# Patient Record
Sex: Female | Born: 1960 | Race: White | Hispanic: No | Marital: Married | Smoking: Never smoker
Health system: Southern US, Community
[De-identification: ages and names within clinical notes are randomized; demographics above are authoritative.]

## PROBLEM LIST (undated history)

## (undated) DIAGNOSIS — M329 Systemic lupus erythematosus, unspecified: Secondary | ICD-10-CM

## (undated) DIAGNOSIS — K219 Gastro-esophageal reflux disease without esophagitis: Secondary | ICD-10-CM

## (undated) DIAGNOSIS — I10 Essential (primary) hypertension: Secondary | ICD-10-CM

## (undated) DIAGNOSIS — T8859XA Other complications of anesthesia, initial encounter: Secondary | ICD-10-CM

## (undated) DIAGNOSIS — C439 Malignant melanoma of skin, unspecified: Secondary | ICD-10-CM

## (undated) DIAGNOSIS — F329 Major depressive disorder, single episode, unspecified: Secondary | ICD-10-CM

## (undated) DIAGNOSIS — K5792 Diverticulitis of intestine, part unspecified, without perforation or abscess without bleeding: Secondary | ICD-10-CM

## (undated) DIAGNOSIS — K859 Acute pancreatitis without necrosis or infection, unspecified: Secondary | ICD-10-CM

## (undated) DIAGNOSIS — T4145XA Adverse effect of unspecified anesthetic, initial encounter: Secondary | ICD-10-CM

## (undated) DIAGNOSIS — M3500 Sicca syndrome, unspecified: Secondary | ICD-10-CM

## (undated) DIAGNOSIS — M797 Fibromyalgia: Secondary | ICD-10-CM

## (undated) DIAGNOSIS — G4733 Obstructive sleep apnea (adult) (pediatric): Secondary | ICD-10-CM

## (undated) DIAGNOSIS — K631 Perforation of intestine (nontraumatic): Secondary | ICD-10-CM

## (undated) DIAGNOSIS — F32A Depression, unspecified: Secondary | ICD-10-CM

## (undated) DIAGNOSIS — I776 Arteritis, unspecified: Secondary | ICD-10-CM

## (undated) DIAGNOSIS — IMO0002 Reserved for concepts with insufficient information to code with codable children: Secondary | ICD-10-CM

## (undated) DIAGNOSIS — L509 Urticaria, unspecified: Secondary | ICD-10-CM

## (undated) DIAGNOSIS — K2970 Gastritis, unspecified, without bleeding: Secondary | ICD-10-CM

## (undated) HISTORY — DX: Obstructive sleep apnea (adult) (pediatric): G47.33

## (undated) HISTORY — DX: Major depressive disorder, single episode, unspecified: F32.9

## (undated) HISTORY — PX: ABDOMINOPLASTY: SUR9

## (undated) HISTORY — DX: Acute pancreatitis without necrosis or infection, unspecified: K85.90

## (undated) HISTORY — PX: PANCREAS SURGERY: SHX731

## (undated) HISTORY — PX: LIVER BIOPSY: SHX301

## (undated) HISTORY — PX: APPENDECTOMY: SHX54

## (undated) HISTORY — PX: ABDOMINAL HYSTERECTOMY: SHX81

## (undated) HISTORY — DX: Diverticulitis of intestine, part unspecified, without perforation or abscess without bleeding: K57.92

## (undated) HISTORY — DX: Depression, unspecified: F32.A

## (undated) HISTORY — PX: REDUCTION MAMMAPLASTY: SUR839

## (undated) HISTORY — PX: CHOLECYSTECTOMY: SHX55

## (undated) HISTORY — DX: Urticaria, unspecified: L50.9

## (undated) HISTORY — PX: CERVICAL FUSION: SHX112

## (undated) HISTORY — DX: Perforation of intestine (nontraumatic): K63.1

## (undated) HISTORY — PX: MELANOMA EXCISION: SHX5266

## (undated) HISTORY — DX: Malignant melanoma of skin, unspecified: C43.9

## (undated) HISTORY — PX: BACK SURGERY: SHX140

## (undated) HISTORY — DX: Gastritis, unspecified, without bleeding: K29.70

---
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
## 1992-08-16 ENCOUNTER — Encounter: Payer: Self-pay | Admitting: Internal Medicine

## 1995-03-13 ENCOUNTER — Encounter: Payer: Self-pay | Admitting: Internal Medicine

## 1995-07-10 ENCOUNTER — Encounter: Payer: Self-pay | Admitting: Internal Medicine

## 1997-09-14 ENCOUNTER — Encounter: Payer: Self-pay | Admitting: Internal Medicine

## 1998-10-04 ENCOUNTER — Other Ambulatory Visit: Admission: RE | Admit: 1998-10-04 | Discharge: 1998-10-04 | Payer: Self-pay | Admitting: Gynecology

## 1998-11-07 ENCOUNTER — Encounter: Payer: Self-pay | Admitting: Internal Medicine

## 1998-11-07 ENCOUNTER — Observation Stay (HOSPITAL_COMMUNITY): Admission: AD | Admit: 1998-11-07 | Discharge: 1998-11-08 | Payer: Self-pay | Admitting: Internal Medicine

## 1998-11-23 ENCOUNTER — Other Ambulatory Visit: Admission: RE | Admit: 1998-11-23 | Discharge: 1998-11-23 | Payer: Self-pay | Admitting: Gynecology

## 1999-05-07 ENCOUNTER — Other Ambulatory Visit: Admission: RE | Admit: 1999-05-07 | Discharge: 1999-05-07 | Payer: Self-pay | Admitting: Gynecology

## 1999-07-10 ENCOUNTER — Emergency Department (HOSPITAL_COMMUNITY): Admission: EM | Admit: 1999-07-10 | Discharge: 1999-07-10 | Payer: Self-pay | Admitting: *Deleted

## 1999-07-10 ENCOUNTER — Encounter: Payer: Self-pay | Admitting: Internal Medicine

## 1999-07-10 ENCOUNTER — Encounter: Payer: Self-pay | Admitting: Cardiology

## 1999-09-07 ENCOUNTER — Inpatient Hospital Stay (HOSPITAL_COMMUNITY): Admission: EM | Admit: 1999-09-07 | Discharge: 1999-09-08 | Payer: Self-pay | Admitting: Emergency Medicine

## 1999-09-24 ENCOUNTER — Other Ambulatory Visit: Admission: RE | Admit: 1999-09-24 | Discharge: 1999-09-24 | Payer: Self-pay | Admitting: Gynecology

## 2004-02-20 ENCOUNTER — Encounter: Payer: Self-pay | Admitting: Internal Medicine

## 2004-02-21 ENCOUNTER — Encounter: Payer: Self-pay | Admitting: Internal Medicine

## 2004-07-11 ENCOUNTER — Encounter: Payer: Self-pay | Admitting: Internal Medicine

## 2005-01-24 ENCOUNTER — Encounter: Payer: Self-pay | Admitting: Internal Medicine

## 2006-11-11 DIAGNOSIS — C439 Malignant melanoma of skin, unspecified: Secondary | ICD-10-CM

## 2006-11-11 HISTORY — DX: Malignant melanoma of skin, unspecified: C43.9

## 2007-04-15 ENCOUNTER — Encounter: Payer: Self-pay | Admitting: Internal Medicine

## 2007-05-25 ENCOUNTER — Encounter: Payer: Self-pay | Admitting: Internal Medicine

## 2008-03-07 ENCOUNTER — Encounter: Payer: Self-pay | Admitting: Internal Medicine

## 2008-04-19 ENCOUNTER — Encounter: Payer: Self-pay | Admitting: Internal Medicine

## 2008-11-11 HISTORY — PX: COLONOSCOPY: SHX174

## 2010-01-05 ENCOUNTER — Telehealth: Payer: Self-pay | Admitting: Internal Medicine

## 2010-01-05 ENCOUNTER — Encounter (INDEPENDENT_AMBULATORY_CARE_PROVIDER_SITE_OTHER): Payer: Self-pay | Admitting: *Deleted

## 2010-01-08 ENCOUNTER — Ambulatory Visit: Payer: Self-pay | Admitting: Internal Medicine

## 2010-01-08 DIAGNOSIS — Z9089 Acquired absence of other organs: Secondary | ICD-10-CM | POA: Insufficient documentation

## 2010-01-08 DIAGNOSIS — K219 Gastro-esophageal reflux disease without esophagitis: Secondary | ICD-10-CM | POA: Insufficient documentation

## 2010-01-08 DIAGNOSIS — M3500 Sicca syndrome, unspecified: Secondary | ICD-10-CM | POA: Insufficient documentation

## 2010-01-11 ENCOUNTER — Ambulatory Visit: Payer: Self-pay | Admitting: Internal Medicine

## 2010-01-12 ENCOUNTER — Encounter (INDEPENDENT_AMBULATORY_CARE_PROVIDER_SITE_OTHER): Payer: Self-pay | Admitting: *Deleted

## 2010-01-15 ENCOUNTER — Encounter: Payer: Self-pay | Admitting: Internal Medicine

## 2010-05-31 ENCOUNTER — Emergency Department (HOSPITAL_COMMUNITY): Admission: EM | Admit: 2010-05-31 | Discharge: 2010-05-31 | Payer: Self-pay | Admitting: Emergency Medicine

## 2010-10-03 ENCOUNTER — Ambulatory Visit: Payer: Self-pay | Admitting: Internal Medicine

## 2010-10-03 DIAGNOSIS — K5732 Diverticulitis of large intestine without perforation or abscess without bleeding: Secondary | ICD-10-CM | POA: Insufficient documentation

## 2010-11-08 ENCOUNTER — Telehealth: Payer: Self-pay | Admitting: Internal Medicine

## 2010-11-19 ENCOUNTER — Encounter
Admission: RE | Admit: 2010-11-19 | Discharge: 2010-11-19 | Payer: Self-pay | Source: Home / Self Care | Attending: Family Medicine | Admitting: Family Medicine

## 2010-12-11 NOTE — Miscellaneous (Signed)
Summary: [facility] med  Clinical Lists Changes  Medications: Added new medication of OMEPRAZOLE 40 MG  CPDR (OMEPRAZOLE) 1 each day 30 minutes before meal  twice daily - Signed Rx of OMEPRAZOLE 40 MG  CPDR (OMEPRAZOLE) 1 each day 30 minutes before meal  twice daily;  #60 x 6;  Signed;  Entered by: Clide Cliff RN;  Authorized by: Hart Carwin MD;  Method used: Electronically to [facility]. [phone]*, [address] [place], [place], [place]  [address], Ph: [phone], Fax: [phone]    Prescriptions: OMEPRAZOLE 40 MG  CPDR (OMEPRAZOLE) 1 each day 30 minutes before meal  twice daily  #60 x 6   Entered by:   Clide Cliff RN   Authorized by:   Hart Carwin MD   Signed by:   Clide Cliff RN on 01/11/2010   Method used:   Electronically to        [facility]. [phone]* (retail)       [address]       [place], [place]  [address]       Ph: [phone]       Fax: [phone]   RxID:   [id number]

## 2010-12-11 NOTE — Letter (Signed)
Summary: Appt Reminder 2  [facility]  [address] [place], [place] [address]   Phone: [phone]  Fax: [phone]        January 12, 2010 MRN: [id number]    TYLOR GAMBRILL [address] [place], [place]  [address]    Dear [name],   You have a return appointment with Dr.Dora Juanda Chance on 03-07-10 at 3:30pm. Please remember to bring a complete list of the medicines you are taking, your insurance card and your co-pay.  If you have to cancel or reschedule this appointment, please call before 5:00 pm the evening before to avoid a cancellation fee.  If you have any questions or concerns, please call [phone].    Sincerely,    Laureen Ochs LPN  Appended Document: Appt Reminder 2 Letter mailed to patient.

## 2010-12-11 NOTE — Procedures (Signed)
Summary: Dorie Rank  MD  Dorie Rank  MD   Imported By: Lester [place] 01/18/2010 13:52:06  _____________________________________________________________________  External Attachment:    Type:   Image     Comment:   External Document

## 2010-12-11 NOTE — Miscellaneous (Signed)
Summary: [facility] med  Clinical Lists Changes  Medications: Added new medication of OMEPRAZOLE 40 MG  CPDR (OMEPRAZOLE) 1 each day 30 minutes before meal twice daily Observations: Added new observation of ALLERGY REV: Done (01/11/2010 15:15)

## 2010-12-11 NOTE — Letter (Signed)
Summary: [facility]   Imported By: Lester [place] 01/18/2010 13:43:54  _____________________________________________________________________  External Attachment:    Type:   Image     Comment:   External Document

## 2010-12-11 NOTE — Letter (Signed)
Summary: EGD Instructions  [facility]  [address] [place], [place] [address]   Phone: [phone]  Fax: [phone]       [name]    1961/06/26    MRN: [id number]       Procedure Day /Date: 01/11/10 Thursday     Arrival Time: 1:00 pm     Procedure Time: 2:00 pm     Location of Procedure:                    _ x _ [facility] (4th Floor)   PREPARATION FOR ENDOSCOPY   On 01/11/10 THE DAY OF THE PROCEDURE:  1.   No solid foods, milk or milk products are allowed after midnight the night before your procedure.  2.   Do not drink anything colored red or purple.  Avoid juices with pulp.  No orange juice.  3.  You may drink clear liquids until 12:00 pm, which is 2 hours before your procedure.                                                                                                CLEAR LIQUIDS INCLUDE: Water Jello Ice Popsicles Tea (sugar ok, no milk/cream) Powdered fruit flavored drinks Coffee (sugar ok, no milk/cream) Gatorade Juice: apple, white grape, white cranberry  Lemonade Clear bullion, consomm, broth Carbonated beverages (any kind) Strained chicken noodle soup Hard Candy   MEDICATION INSTRUCTIONS  Unless otherwise instructed, you should take regular prescription medications with a small sip of water as early as possible the morning of your procedure.                   OTHER INSTRUCTIONS  You will need a responsible adult at least 51 years of age to accompany you and drive you home.   This person must remain in the waiting room during your procedure.  Wear loose fitting clothing that is easily removed.  Leave jewelry and other valuables at home.  However, you may wish to bring a book to read or an iPod/MP3 player to listen to music as you wait for your procedure to start.  Remove all body piercing jewelry and leave at home.  Total time from sign-in until discharge is approximately 2-3 hours.  You should  go home directly after your procedure and rest.  You can resume normal activities the day after your procedure.  The day of your procedure you should not:   Drive   Make legal decisions   Operate machinery   Drink alcohol   Return to work  You will receive specific instructions about eating, activities and medications before you leave.    The above instructions have been reviewed and explained to me by   Hortense Ramal CMA Duncan Dull)  January 08, 2010 3:33 PM    I fully understand and can verbalize these instructions _____________________________ Date 01/08/10

## 2010-12-11 NOTE — Progress Notes (Signed)
Summary: Triage  Phone Note Call from Patient Call back at [phone]   Call For: DR Emmanual Gauthreaux Reason for Call: Talk to Nurse Summary of Call: Wants to explain in detail the problem she had last night and she almost had to go to ER. Says she has been a pt of dr Juanda Chance for 20 years but [place]. 6wks ago she moved back. Scheduled rov for next available end of April. Says Dr Juanda Chance knows her very well along with alot of her family members. Initial call taken by: Leanor Kail [facility],  January 05, 2010 12:44 PM  Follow-up for Phone Call        Pt. had an episode of abd.pain, nausea & vomiting  last night, would like to be seen ASAP. Pt. will see Dr.Yeimi Debnam on 01-08-10 at 2:45pm. If symptoms become worse call back immediately or go to ER.  Follow-up by: Laureen Ochs LPN,  January 05, 2010 1:18 PM

## 2010-12-11 NOTE — Procedures (Signed)
Summary: [facility]  Colon/[facility]   Imported By: Lester [place] 01/18/2010 13:42:11  _____________________________________________________________________  External Attachment:    Type:   Image     Comment:   External Document

## 2010-12-11 NOTE — Procedures (Signed)
Summary: Upper Endoscopy  Patient: Abigail [name] Note: All result statuses are Final unless otherwise noted.  Tests: (1) Upper Endoscopy (EGD)   EGD Upper Endoscopy       DONE     [facility]     [address]     [place], [place]  [address]           ENDOSCOPY PROCEDURE REPORT           PATIENT:  Abigail, [name]  MR#:  [id number]     BIRTHDATE:  May 11, 1961, 48 yrs. old  GENDER:  female           ENDOSCOPIST:  Hedwig Morton. Juanda Chance, MD     Referred by:  Robert Bellow, M.D.           PROCEDURE DATE:  01/11/2010     PROCEDURE:  EGD with biopsy     ASA CLASS:  Class I     INDICATIONS:  abdominal pain, dysphagia hx GERD, Sjogren's     syndrome,     post chole, hx pancreatitis           MEDICATIONS:   Versed 8 mg, Fentanyl 50 mcg     TOPICAL ANESTHETIC:  Exactacain Spray           DESCRIPTION OF PROCEDURE:   After the risks benefits and     alternatives of the procedure were thoroughly explained, informed     consent was obtained.  The LB GIF-H180 [id number] endoscope was     introduced through the mouth and advanced to the second portion of     the duodenum, without limitations.  The instrument was slowly     withdrawn as the mucosa was fully examined.     <<PROCEDUREIMAGES>>           Esophagitis was found (see image1, image2, and image8). Grade 2     esophagitis, several >5 mm erosions, no stricture  Mild gastritis     was found (see image7, image5, and image6). coffee ground material     in the stomach  Duodenitis was found. patchy erythema With     standard forceps, a biopsy was obtained and sent to pathology (see     image4 and image3).    Retroflexed views revealed no     abnormalities.    The scope was then withdrawn from the patient     and the procedure completed.           COMPLICATIONS:  None           ENDOSCOPIC IMPRESSION:     1) Esophagitis     2) Mild gastritis     3) Duodenitis     RECOMMENDATIONS:     1) Await biopsy results     2) Anti-reflux regimen to  be follow     increase Aciphex to 20 mg po bid     OV 8 weeks           REPEAT EXAM:  In 0 year(s) for.           ______________________________     Hedwig Morton. Juanda Chance, MD           CC:           n.     eSIGNED:   Hedwig Morton. Alzina Golda at 01/11/2010 02:51 PM           Christena Flake, [phone]  Note: An exclamation mark (!) indicates a result that was  not dispersed into the flowsheet. Document Creation Date: 01/11/2010 2:51 PM _______________________________________________________________________  (1) Order result status: Final Collection or observation date-time: 01/11/2010 14:34 Requested date-time:  Receipt date-time:  Reported date-time:  Referring Physician:   Ordering Physician: Lina Sar [phone]) Specimen Source:  Source: Launa Grill Order Number: [id number] Lab site:   Appended Document: Upper Endoscopy no recall EGD

## 2010-12-11 NOTE — Letter (Signed)
Summary: Patient [facility] Biopsy Results  [facility]  [address] [place], [place] [address]   Phone: [phone]  Fax: [phone]        January 15, 2010 MRN: [id number]    ANAISE STERBENZ [address] [place], [place]  [address]    Dear Ms. Saran,  I am pleased to inform you that the biopsies taken during your recent endoscopic examination did not show any evidence of cancer upon pathologic examination.The biopsies from the duodenum confirm  chronic inflammation.  Additional information/recommendations:  __No further action is needed at this time.  Please follow-up with      your primary care physician for your other healthcare needs.  __ Please call [phone] to schedule a return visit to review      your condition.  _x_ Continue with the treatment plan as outlined on the day of your      exam.  _   Please call us if you are having persistent problems or have questions about your condition that have not been fully answered at this time.  Sincerely,  Hart Carwin MD  This letter has been electronically signed by your physician.  Appended Document: Patient Notice-Endo Biopsy Results Letter mailed 3.8.11

## 2010-12-11 NOTE — Procedures (Signed)
Summary: ENDO/[facility]  END[facility]   Imported By: Lester [place] 01/18/2010 13:55:53  _____________________________________________________________________  External Attachment:    Type:   Image     Comment:   External Document

## 2010-12-11 NOTE — Procedures (Signed)
Summary: [facility]  [facility]   Imported By: Lester [place] 01/18/2010 13:50:59  _____________________________________________________________________  External Attachment:    Type:   Image     Comment:   External Document

## 2010-12-11 NOTE — Assessment & Plan Note (Signed)
Summary: ABD PAIN/YF   History of Present Illness Visit Type: Follow-up Visit Primary GI MD: Lina Sar MD Primary Provider: Nadyne Coombes, MD Requesting Provider: na Chief Complaint: Patient states that she had some diverticulitis about a month ago but she is doing good now. She is just here for follow up. History of Present Illness:   This is a 50 year old white female with  history of Sjgren's syndrome and chronic abdominal pain. I havebeen  seeing her since 39. She moved to [place] for several years  and returned back one year ago. She was followed at [facility] by Dr Alene Mires for keratoconjunctivitis, xerostomia, elevated porphyrins, polyclonal gammopathy. She had a positive rheumatoid factor and positive ANA. 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 She has had periodic colonoscopies and endoscopies in [place]. Her last colonoscopy in 2009 showed diverticulosis. Her last upper endoscopy in March 2011 by me showed esophagitis which responded to AcipHex. Biopsies showed chronic active duodenitis. She has a history of hospitalizations for diverticulitis. Her last attack occurred 3 weeks ago and responded to Cipro and Flagyl.   GI Review of Systems      Denies abdominal pain, acid reflux, belching, bloating, chest pain, dysphagia with liquids, dysphagia with solids, heartburn, loss of appetite, nausea, vomiting, vomiting blood, weight loss, and  weight gain.        Denies anal fissure, black tarry stools, change in bowel habit, constipation, diarrhea, diverticulosis, fecal incontinence, heme positive stool, hemorrhoids, irritable bowel syndrome, jaundice, light color stool, liver problems, rectal bleeding, and  rectal pain.    Current Medications (verified): 1)  Zantac 150 Mg Tabs (Ranitidine Hcl) .... One Tablet By Mouth Once Daily 2)  Ativan 1 Mg Tabs (Lorazepam) .... As Needed 3)  Adderall 5 Mg Tabs (Amphetamine-Dextroamphetamine)  .... Take One By Mouth Once Daily  Allergies (verified): 1)  ! Sulfa 2)  ! Doxycycline 3)  ! Demerol  Past History:  Past Medical History: Asthma Chronic Headaches Diverticulosis Hypertension Obesity Pancreatitis Sjogrens Syndrome  ADD  Past Surgical History: Reviewed history from 01/08/2010 and no changes required. Appendectomy Cholecystectomy Hysterectomy  Family History: Reviewed history from 01/08/2010 and no changes required. Family History of Colon Cancer:Paternal  Uncle  Family History of Colon Polyps:Sister, Brother, and Mother   Social History: Occupation: Part time work Married 2 childern Patient has never smoked.  Alcohol Use - no Daily Caffeine Use: 2 daily  Illicit Drug Use - no  Review of Systems       The patient complains of allergy/sinus and headaches-new.  The patient denies anemia, anxiety-new, arthritis/joint pain, back pain, blood in urine, breast changes/lumps, change in vision, confusion, cough, coughing up blood, depression-new, fainting, fatigue, fever, hearing problems, heart murmur, heart rhythm changes, itching, menstrual pain, muscle pains/cramps, night sweats, nosebleeds, pregnancy symptoms, shortness of breath, skin rash, sleeping problems, sore throat, swelling of feet/legs, swollen lymph glands, thirst - excessive , urination - excessive , urination changes/pain, urine leakage, vision changes, and voice change.         Pertinent positive and negative review of systems were noted in the above HPI. All other ROS was otherwise negative.   Vital Signs:  Patient profile:   50 year old female Height:      65 inches Weight:      207.8 pounds BMI:     34.70 Pulse rate:   84 / minute Pulse rhythm:   regular BP sitting:   110 / 70  (left  arm) Cuff size:   regular  Vitals Entered By: Harlow Mares CMA Duncan Dull) (October 03, 2010 11:07 AM)  Physical Exam  General:  Well developed, well nourished, no acute distress. Eyes:  PERRLA, no  icterus. Mouth:  No deformity or lesions, dentition normal. Neck:  Supple; no masses or thyromegaly. Lungs:  Clear throughout to auscultation. Heart:  Regular rate and rhythm; no murmurs, rubs,  or bruits. Abdomen:  soft abdomen , normoactive bowel sounds. Mild tenderness left lower quadrant. Well-healed surgical scar around the umbilicus. Liver edge at costal margin. Msk:  Symmetrical with no gross deformities. Normal posture. Extremities:  No clubbing, cyanosis, edema or deformities noted. Skin:  Intact without significant lesions or rashes. Psych:  Alert and cooperative. Normal mood and affect.   Impression & Recommendations:  Problem # 1:  DIVERTICULITIS, COLON (ICD-562.11) Patient has symptomatic diverticulosis with episodes of diverticulitis. Her last episode 3 weeks ago responded to antibiotics. It was suggested to her to have a segmental resection of the sigmoid colon like her 2 sisters but she would like to wait. She will slowly increase the fiber in her diet. She cannot take anti-spasmodics because of Sjgren's syndrome causing decreased saliva.. She is up-to-date on her colonoscopy.  Problem # 2:  SJOGREN'S SYNDROME (ICD-710.2) She will try to re-establish with Dr. Alene Mires at [facility].  Problem # 3:  GERD (ICD-530.81) controlled with Prilosec 20 mg daily.  Patient Instructions: 1)  Continue Zantac 150  mg daily. 2)  Continue to increase fiber content in your diet. 3)  Return p.r.n. 4)  Recall colonoscopy in 2014. 5)  Copy sent to : DR K.Richter 6)  The medication list was reviewed and reconciled.  All changed / newly prescribed medications were explained.  A complete medication list was provided to the patient / caregiver.

## 2010-12-11 NOTE — Assessment & Plan Note (Signed)
Summary: ABD PAIN/YF   History of Present Illness Visit Type: consult  Primary GI MD: Lina Sar MD Primary Provider: Robert Bellow, MD  Requesting Provider: Robert Bellow, MD  Chief Complaint: Epigastric pain, nausea, vomiting, dysphagia, and  History of Present Illness:   This is a 50 year old white female former patient of mine who moved away to live in [place] for about 8 years and now has moved back to this area. Her paper records are still in the warehouse and are not available to me today. She comes with episodes of dysphagia,  epigastric and subxiphoid pain which woke her up at 3 in the morning last week. She has known vasculitis and Sjogren's syndrome. She is post cholecystectomy and has a history of low-grade pancreatitis. Her medications include Zantac 75 mg a day and most recently 150 mg a day. The dysphagia occurs with solids as well as with liquids. She had an upper endoscopy in [place] several years ago and was told to have a small hiatal hernia. Patient denies taking any anti-inflammatory agents but was on Prednisone on several occasions last year.   GI Review of Systems    Reports abdominal pain, acid reflux, dysphagia with liquids, dysphagia with solids, heartburn, nausea, and  vomiting.     Location of  Abdominal pain: epigastric area.    Denies belching, bloating, chest pain, loss of appetite, vomiting blood, weight loss, and  weight gain.        Denies anal fissure, black tarry stools, change in bowel habit, constipation, diarrhea, diverticulosis, fecal incontinence, heme positive stool, hemorrhoids, irritable bowel syndrome, jaundice, light color stool, liver problems, rectal bleeding, and  rectal pain.    Current Medications (verified): 1)  Zoloft 50 Mg Tabs (Sertraline Hcl) .... One Tablet By Mouth Once Daily in The Morning 2)  Ritalin 10 Mg Tabs (Methylphenidate Hcl) .... One Tablet By Mouth Once Daily 3)  Lisinopril-Hydrochlorothiazide 20-12.5 Mg Tabs  (Lisinopril-Hydrochlorothiazide) .... One Tablet By Mouth Once Daily 4)  Plaquenil 200 Mg Tabs (Hydroxychloroquine Sulfate) .... One Tablet By Mouth Once Daily 5)  Zantac 150 Mg Tabs (Ranitidine Hcl) .... One Tablet By Mouth Once Daily 6)  Depakote 500 Mg Tbec (Divalproex Sodium) .... One Tablet By Mouth Once Daily 7)  Ativan 1 Mg Tabs (Lorazepam) .... As Needed  Allergies (verified): 1)  ! Sulfa 2)  ! Doxycycline 3)  ! Demerol  Past History:  Past Medical History: Asthma Chronic Headaches Diverticulosis Hypertension Obesity Pancreatitis Sjogrens Syndrome   Past Surgical History: Appendectomy Cholecystectomy Hysterectomy  Family History: Family History of Colon Cancer:Paternal  Uncle  Family History of Colon Polyps:Sister, Brother, and Mother   Social History: Occupation: Unemployed Married 2 childern Patient has never smoked.  Alcohol Use - no Daily Caffeine Use: 2 daily  Illicit Drug Use - no Smoking Status:  never Drug Use:  no  Review of Systems       The patient complains of allergy/sinus and headaches-new.  The patient denies anemia, anxiety-new, arthritis/joint pain, back pain, blood in urine, breast changes/lumps, change in vision, confusion, cough, coughing up blood, depression-new, fainting, fatigue, fever, hearing problems, heart murmur, heart rhythm changes, itching, menstrual pain, muscle pains/cramps, night sweats, nosebleeds, pregnancy symptoms, shortness of breath, skin rash, sleeping problems, sore throat, swelling of feet/legs, swollen lymph glands, thirst - excessive , urination - excessive , urination changes/pain, urine leakage, vision changes, and voice change.         Pertinent positive and negative review of systems were noted in  the above HPI. All other ROS was otherwise negative.   Vital Signs:  Patient profile:   50 year old female Height:      65 inches Weight:      203 pounds BMI:     33.90 BSA:     1.99 Pulse rate:   92 /  minute Pulse rhythm:   regular BP sitting:   100 / 68  (right arm) Cuff size:   regular  Vitals Entered By: Ok Anis CMA (January 08, 2010 2:37 PM)  Physical Exam  General:  Well developed, well nourished, no acute distress. Eyes:  PERRLA, no icterus. Mouth:  No deformity or lesions, dentition normal. Neck:  Supple; no masses or thyromegaly. Lungs:  Clear throughout to auscultation. Heart:  Regular rate and rhythm; no murmurs, rubs,  or bruits. Abdomen:  soft abdomen with normoactive bowel sounds. Post abdominoplasty scars. Tenderness in epigastric and subxiphoid area. No rebound or abnormal pulsations. Liver edge at costal margin. No CVA tenderness. Extremities:  No clubbing, cyanosis, edema or deformities noted. Skin:  Intact without significant lesions or rashes. Psych:  Alert and cooperative. Normal mood and affect.   Impression & Recommendations:  Problem # 1:  GERD (ICD-530.81)  Patient has epigastric pain at night consistent with gastroesophageal reflux. Her dysphagia could be related to esophageal spasm or an esophageal stricture. We will increase her acid suppression by using proton pump inhibitor. AcipHex 20 mg samples have been given for her to take daily. She was advised to follow antireflux measures. We will schedule her for an upper endoscopy and possible dilatation.  Orders: EGD (EGD)  Problem # 2:  CHOLECYSTECTOMY, HX OF (ICD-V45.79) Patient has a history of a remote cholecystectomy and pancreatitis possibly due to vasculitis versus biliary problems. It is not a problem at the present time.  Problem # 3:  SJOGREN'S SYNDROME (ICD-710.2) Patient has vasculitis and Sjogren syndrome of many years duration for which she is followed by a rheumatologist.  Patient Instructions: 1)  AcipHex 20 mg daily. 2)  Antireflux measures. 3)  Upper endoscopy biopsy and possible dilatation., brushing for Candida 4)  Copy sent to : Dr Clarisa Schools 5)  The medication list was  reviewed and reconciled.  All changed / newly prescribed medications were explained.  A complete medication list was provided to the patient / caregiver.

## 2010-12-11 NOTE — Progress Notes (Signed)
Summary[facility]   Imported By: Sherian Rein 01/12/2010 10:14:59  _____________________________________________________________________  External Attachment:    Type:   Image     Comment:   External Document

## 2010-12-11 NOTE — Letter (Signed)
Summary: New Patient letter  [facility]  [address] [place], [place] [address]   Phone: [phone]  Fax: [phone]       01/05/2010 MRN: [id number]  Abigail [name] [address] [place], [place]  [address]  Dear Abigail [name],  Welcome to the Gastroenterology Division at [facility].    You are scheduled to see Dr.  Juanda Chance on 03-05-10 at 2:45pm on the 3rd floor at [facility], [address]  We ask that you try to arrive at our office 15 minutes prior to your appointment time to allow for check-in.  We would like you to complete the enclosed self-administered evaluation form prior to your visit and bring it with you on the day of your appointment.  We will review it with you.  Also, please bring a complete list of all your medications or, if you prefer, bring the medication bottles and we will list them.  Please bring your insurance card so that we may make a copy of it.  If your insurance requires a referral to see a specialist, please bring your referral form from your primary care physician.  Co-payments are due at the time of your visit and may be paid by cash, check or credit card.     Your office visit will consist of a consult with your physician (includes a physical exam), any laboratory testing he/she may order, scheduling of any necessary diagnostic testing (e.g. x-ray, ultrasound, CT-scan), and scheduling of a procedure (e.g. Endoscopy, Colonoscopy) if required.  Please allow enough time on your schedule to allow for any/all of these possibilities.    If you cannot keep your appointment, please call [phone] to cancel or reschedule prior to your appointment date.  This allows [place] the opportunity to schedule an appointment for another patient in need of care.  If you do not cancel or reschedule by 5 p.m. the business day prior to your appointment date, you will be charged a $50.00 late cancellation/no-show fee.    Thank you for choosing  [facility] for your medical needs.  We appreciate the opportunity to care for you.  Please visit [place] at our website  to learn more about our practice.                     Sincerely,                                                             The Gastroenterology Division

## 2010-12-11 NOTE — Procedures (Signed)
Summary: [facility]CP/[facility]  ERCP/[facility]   Imported By: Sherian Rein 01/12/2010 10:19:00  _____________________________________________________________________  External Attachment:    Type:   Image     Comment:   External Document

## 2010-12-13 NOTE — Progress Notes (Signed)
Summary: triage  Phone Note Call from Patient Call back at Home Phone [phone]   Caller: Patient Call For: Dr Juanda Chance Reason for Call: Talk to Nurse Summary of Call: Diverticulities flare up Initial call taken by: Tawni Levy,  November 08, 2010 2:30 PM  Follow-up for Phone Call        Patient calling to report for the last 5 days, she has had left lower abdominal pain that is associated with eating. Pt. states the pain is a dull ache, sometimes crampy. Rating pain at a 4. Patient describes she "feels like the colon is swollen and there is pressure in my rectum."Denies diarrhea. States she is having 4 formed stools/day. States she has nausea. Denies vomiting or fever. Patient states she had diverticulitis back on Nov. and she took Cipro and Flagyl. She did not complete the Flagyl due to a reaction to it.(vomiting). She is allergic to Sulfa, Doxcycline and Demerol.  States "I just feel yucky." She is wondering if she needs a CT scan. Please, advise. Follow-up by: Jesse Fall RN,  November 08, 2010 2:47 PM  Additional Follow-up for Phone Call Additional follow up Details #1::        Rather than doing CT scan ( she had many in the past), I would give her Augmentin 875 mg, #10, 1 by mouth once daily, call us if not better in 7-10 days and we will consider CT scan Additional Follow-up by: Hart Carwin MD,  November 08, 2010 11:07 PM    Additional Follow-up for Phone Call Additional follow up Details #2::    Patient notifed of Dr. Regino Schultze recommendations. Rx sent to patient's pharmacy. Patient will call back in 7-10 days if not better. Patient also requesting an rx for Zofran. States she likes to keep it on hand. She has gotten this from Urgent Care in the past. May she have Zofran rx? Follow-up by: Jesse Fall RN,  November 09, 2010 8:49 AM  Additional Follow-up for Phone Call Additional follow up Details #3:: Details for Additional Follow-up Action Taken: OK to give Zofran 4  mg, # 20, 1 by mouth q 8 hrs as needed nausea Additional Follow-up by: Hart Carwin MD,  November 10, 2010 9:56 PM  New/Updated Medications: AUGMENTIN 875-125 MG TABS (AMOXICILLIN-POT CLAVULANATE) Take one by mouth daily ZOFRAN 4 MG TABS (ONDANSETRON HCL) Take on by mouth every 8 hours as needed nausea Prescriptions: ZOFRAN 4 MG TABS (ONDANSETRON HCL) Take on by mouth every 8 hours as needed nausea  #20 x 0   Entered by:   Jesse Fall RN   Authorized by:   Hart Carwin MD   Signed by:   Jesse Fall RN on 11/13/2010   Method used:   Electronically to        [facility]* (retail)       [address]       [place], [place]  [id number]       Ph: [phone]       Fax: [phone]   RxID:   [id number] AUGMENTIN 875-125 MG TABS (AMOXICILLIN-POT CLAVULANATE) Take one by mouth daily  #10 x 0   Entered by:   Jesse Fall RN   Authorized by:   Hart Carwin MD   Signed by:   Jesse Fall RN on 11/09/2010   Method used:   Electronically to        [facility]* (retail)       [address]  [place], [place]  [id number]       Ph: [phone]       Fax: [phone]   RxID:   [id number]   Appended Document: triage Message left for patient to call back. Jesse Fall, RN 11/13/10 8:28 AM  Appended Document: triage Patient notified that Zofran rx was sent to pharmacy

## 2010-12-14 NOTE — Procedures (Signed)
Summary: Colonoscopy/[facility]  Colonoscopy/[facility]   Imported By: Sherian Rein 01/12/2010 10:17:48  _____________________________________________________________________  External Attachment:    Type:   Image     Comment:   External Document

## 2011-01-26 LAB — CBC
MCH: 29.6 pg (ref 26.0–34.0)
MCHC: 33.7 g/dL (ref 30.0–36.0)
Platelets: 225 10*3/uL (ref 150–400)
RDW: 13.7 % (ref 11.5–15.5)

## 2011-01-26 LAB — BASIC METABOLIC PANEL
CO2: 23 mEq/L (ref 19–32)
Calcium: 8.3 mg/dL — ABNORMAL LOW (ref 8.4–10.5)
Creatinine, Ser: 0.72 mg/dL (ref 0.4–1.2)
Glucose, Bld: 173 mg/dL — ABNORMAL HIGH (ref 70–99)

## 2011-01-26 LAB — D-DIMER, QUANTITATIVE: D-Dimer, Quant: 0.33 ug/mL-FEU (ref 0.00–0.48)

## 2011-01-26 LAB — DIFFERENTIAL
Basophils Absolute: 0 10*3/uL (ref 0.0–0.1)
Basophils Relative: 0 % (ref 0–1)
Eosinophils Absolute: 0 10*3/uL (ref 0.0–0.7)
Neutrophils Relative %: 88 % — ABNORMAL HIGH (ref 43–77)

## 2011-01-26 LAB — POCT CARDIAC MARKERS

## 2011-03-24 ENCOUNTER — Observation Stay (HOSPITAL_COMMUNITY)
Admission: AD | Admit: 2011-03-24 | Discharge: 2011-03-25 | Disposition: A | Discharge status: Home / Self Care | Payer: Medicare Other | Source: Ambulatory Visit | Attending: Family Medicine | Admitting: Family Medicine

## 2011-03-24 ENCOUNTER — Inpatient Hospital Stay (HOSPITAL_COMMUNITY): Payer: Medicare Other

## 2011-03-24 DIAGNOSIS — R319 Hematuria, unspecified: Secondary | ICD-10-CM | POA: Insufficient documentation

## 2011-03-24 DIAGNOSIS — G43909 Migraine, unspecified, not intractable, without status migrainosus: Secondary | ICD-10-CM | POA: Insufficient documentation

## 2011-03-24 DIAGNOSIS — I10 Essential (primary) hypertension: Secondary | ICD-10-CM | POA: Insufficient documentation

## 2011-03-24 DIAGNOSIS — E876 Hypokalemia: Secondary | ICD-10-CM | POA: Insufficient documentation

## 2011-03-24 DIAGNOSIS — R6883 Chills (without fever): Secondary | ICD-10-CM | POA: Insufficient documentation

## 2011-03-24 DIAGNOSIS — IMO0001 Reserved for inherently not codable concepts without codable children: Principal | ICD-10-CM | POA: Insufficient documentation

## 2011-03-24 DIAGNOSIS — M2550 Pain in unspecified joint: Secondary | ICD-10-CM | POA: Insufficient documentation

## 2011-03-24 DIAGNOSIS — K219 Gastro-esophageal reflux disease without esophagitis: Secondary | ICD-10-CM | POA: Insufficient documentation

## 2011-03-24 LAB — COMPREHENSIVE METABOLIC PANEL
Alkaline Phosphatase: 82 U/L (ref 39–117)
BUN: 18 mg/dL (ref 6–23)
Glucose, Bld: 93 mg/dL (ref 70–99)
Potassium: 2.2 mEq/L — CL (ref 3.5–5.1)
Total Bilirubin: 0.5 mg/dL (ref 0.3–1.2)
Total Protein: 7.5 g/dL (ref 6.0–8.3)

## 2011-03-24 LAB — CBC
HCT: 37.4 % (ref 36.0–46.0)
MCHC: 35.3 g/dL (ref 30.0–36.0)
MCV: 80.1 fL (ref 78.0–100.0)
RDW: 12.8 % (ref 11.5–15.5)
WBC: 8.7 10*3/uL (ref 4.0–10.5)

## 2011-03-25 DIAGNOSIS — R509 Fever, unspecified: Secondary | ICD-10-CM

## 2011-03-25 LAB — BASIC METABOLIC PANEL
BUN: 17 mg/dL (ref 6–23)
Calcium: 8.3 mg/dL — ABNORMAL LOW (ref 8.4–10.5)
GFR calc non Af Amer: 54 mL/min — ABNORMAL LOW (ref >60)
Glucose, Bld: 108 mg/dL — ABNORMAL HIGH (ref 70–99)

## 2011-03-25 LAB — URINALYSIS, ROUTINE W REFLEX MICROSCOPIC
Glucose, UA: NEGATIVE mg/dL
Leukocytes, UA: NEGATIVE
Nitrite: NEGATIVE
Protein, ur: NEGATIVE mg/dL
Urobilinogen, UA: 0.2 mg/dL (ref 0.0–1.0)

## 2011-03-25 LAB — URINE MICROSCOPIC-ADD ON

## 2011-03-25 LAB — CBC
HCT: 33.4 % — ABNORMAL LOW (ref 36.0–46.0)
MCH: 28.4 pg (ref 26.0–34.0)
MCHC: 35.3 g/dL (ref 30.0–36.0)
MCV: 80.3 fL (ref 78.0–100.0)
RDW: 12.8 % (ref 11.5–15.5)

## 2011-03-26 LAB — URINE CULTURE
Culture  Setup Time: 201205140426
Culture: NO GROWTH

## 2011-03-28 ENCOUNTER — Other Ambulatory Visit: Payer: Self-pay | Admitting: Emergency Medicine

## 2011-03-29 ENCOUNTER — Inpatient Hospital Stay (HOSPITAL_COMMUNITY)
Admission: AD | Admit: 2011-03-29 | Discharge: 2011-03-31 | DRG: 546 | Disposition: A | Discharge status: Home / Self Care | Payer: Medicare Other | Source: Ambulatory Visit | Attending: Family Medicine | Admitting: Family Medicine

## 2011-03-29 ENCOUNTER — Ambulatory Visit
Admission: RE | Admit: 2011-03-29 | Discharge: 2011-03-29 | Disposition: A | Discharge status: Home / Self Care | Payer: Medicare Other | Source: Ambulatory Visit | Attending: Emergency Medicine | Admitting: Emergency Medicine

## 2011-03-29 DIAGNOSIS — Z881 Allergy status to other antibiotic agents status: Secondary | ICD-10-CM

## 2011-03-29 DIAGNOSIS — K219 Gastro-esophageal reflux disease without esophagitis: Secondary | ICD-10-CM | POA: Diagnosis present

## 2011-03-29 DIAGNOSIS — G43909 Migraine, unspecified, not intractable, without status migrainosus: Secondary | ICD-10-CM | POA: Diagnosis present

## 2011-03-29 DIAGNOSIS — R509 Fever, unspecified: Secondary | ICD-10-CM | POA: Diagnosis present

## 2011-03-29 DIAGNOSIS — Z79899 Other long term (current) drug therapy: Secondary | ICD-10-CM

## 2011-03-29 DIAGNOSIS — F3289 Other specified depressive episodes: Secondary | ICD-10-CM | POA: Diagnosis present

## 2011-03-29 DIAGNOSIS — Z882 Allergy status to sulfonamides status: Secondary | ICD-10-CM

## 2011-03-29 DIAGNOSIS — M3500 Sicca syndrome, unspecified: Principal | ICD-10-CM | POA: Diagnosis present

## 2011-03-29 DIAGNOSIS — Z888 Allergy status to other drugs, medicaments and biological substances status: Secondary | ICD-10-CM

## 2011-03-29 DIAGNOSIS — F329 Major depressive disorder, single episode, unspecified: Secondary | ICD-10-CM | POA: Diagnosis present

## 2011-03-29 DIAGNOSIS — B370 Candidal stomatitis: Secondary | ICD-10-CM | POA: Diagnosis present

## 2011-03-29 LAB — COMPREHENSIVE METABOLIC PANEL
ALT: 37 U/L — ABNORMAL HIGH (ref 0–35)
AST: 25 U/L (ref 0–37)
Albumin: 2.8 g/dL — ABNORMAL LOW (ref 3.5–5.2)
Alkaline Phosphatase: 86 U/L (ref 39–117)
Calcium: 8.4 mg/dL (ref 8.4–10.5)
GFR calc Af Amer: >60 mL/min (ref >60)
Glucose, Bld: 115 mg/dL — ABNORMAL HIGH (ref 70–99)
Potassium: 3.9 mEq/L (ref 3.5–5.1)
Sodium: 133 mEq/L — ABNORMAL LOW (ref 135–145)
Total Protein: 7.3 g/dL (ref 6.0–8.3)

## 2011-03-29 LAB — DIFFERENTIAL
Basophils Absolute: 0 10*3/uL (ref 0.0–0.1)
Eosinophils Relative: 2 % (ref 0–5)
Monocytes Absolute: 0.8 10*3/uL (ref 0.1–1.0)
Monocytes Relative: 10 % (ref 3–12)
Neutrophils Relative %: 71 % (ref 43–77)

## 2011-03-29 LAB — CBC
HCT: 32.4 % — ABNORMAL LOW (ref 36.0–46.0)
MCHC: 34.9 g/dL (ref 30.0–36.0)
RDW: 12.9 % (ref 11.5–15.5)
WBC: 8.1 10*3/uL (ref 4.0–10.5)

## 2011-03-30 DIAGNOSIS — M3500 Sicca syndrome, unspecified: Secondary | ICD-10-CM

## 2011-03-30 DIAGNOSIS — R509 Fever, unspecified: Secondary | ICD-10-CM

## 2011-03-30 LAB — BASIC METABOLIC PANEL
BUN: 20 mg/dL (ref 6–23)
Creatinine, Ser: 1.14 mg/dL (ref 0.4–1.2)
GFR calc non Af Amer: 50 mL/min — ABNORMAL LOW (ref >60)
Glucose, Bld: 188 mg/dL — ABNORMAL HIGH (ref 70–99)

## 2011-03-30 LAB — CBC
HCT: 33.3 % — ABNORMAL LOW (ref 36.0–46.0)
MCH: 27.9 pg (ref 26.0–34.0)
MCHC: 33.9 g/dL (ref 30.0–36.0)
MCV: 82.2 fL (ref 78.0–100.0)
Platelets: 249 10*3/uL (ref 150–400)
RDW: 13 % (ref 11.5–15.5)

## 2011-03-31 LAB — CULTURE, BLOOD (ROUTINE X 2)
Culture  Setup Time: 201205140002
Culture: NO GROWTH
Culture: NO GROWTH

## 2011-04-10 NOTE — Discharge Summary (Signed)
Abigail [name], Abigail [name]               ACCOUNT NO.:  [id number]  MEDICAL RECORD NO.:  [id number]           PATIENT TYPE:  I  LOCATION:  6737                         FACILITY:  [facility]  PHYSICIAN:  Leighton Roach Jai Steil, M.D.DATE OF BIRTH:  [birth year]  DATE OF ADMISSION:  03/29/2011 DATE OF DISCHARGE:  03/31/2011                              DISCHARGE SUMMARY   PRIMARY CARE PROVIDER:  Brett Canales A. [name], MD at [facility].  RHEUMATOLOGIST:  Alben Deeds, MD  DISCHARGE DIAGNOSES: 1. Fever without a source. 2. Sj"gren's disease. 3. Gastroesophageal reflux disease. 4. Depression. 5. Migraines. 6. History of recurrent pancreatitis. 7. History of diverticulitis.  DISCHARGE MEDICATIONS: 1. Zofran 4 mg p.o. q.8 h. p.r.n. 2. Tylenol 650 mg p.o. q.6 h. p.r.n. 3. Depakote 500 mg p.o. daily. 4. Zoloft 50 mg p.o. daily. 5. Ativan 1 mg p.o. at bedtime. 6. Artificial tears b.i.d. 7. Estradiol 1 mg p.o. daily. 8. Zantac 75 mg p.o. daily. 9. Augmentin 875 mg p.o. b.i.d.  CONSULTS:  None.  PROCEDURES:  None.  LABORATORY DATA:  On admission, the patient's CBC showed a white count of 8.1, hemoglobin 11.3, platelet count of 240,000, 71% neutrophils, ANC of 5.7.  Repeat CBC the next day showed WBC 5.7, hemoglobin 11.3, platelets 249,000.  CMET on admission 133/3.9/100/25/19/1.15/115.  T- bili low at 0.2, alk phos 86, AST/ALT 25/37, total protein 7.3, albumin 2.8, calcium 8.4.  Repeat BMET was relatively unchanged.  BRIEF HOSPITAL COURSE:  This is a 50 year old female recently discharged May 14 presenting with continued fevers and rigors. 1. Fever without a source.  The patient has been having fevers for the     past 1-2 weeks.  Recent admission less than 1 week ago showed a     negative infectious workup including negative blood cultures and     urine cultures.  Differential diagnosis on admission included     infectious source versus rheumatologic cause versus other cause.     Given the  recent negative infectious workup, it was felt that the     patient's fevers were more likely due to Sj"gren's disease.  The     patient was directly admitted from her PCPs office for observation     while receiving IV steroids with concern that the patient does have     an infection.  She should be more closely monitored.  In addition,     the patient has a history of steroid psychosis.  While in-house,     the patient did not have any fevers x36 hours.  In addition on the     day of discharge, the patient's rigors and sweats appeared to have     been improved.  The patient was given 3 doses of IV Solu-Medrol 40     mg daily and has close dermatologic followup for the day after     discharge.  No further infectious workup was performed as it was     felt that this is most likely rheumatologic cause. 2. Psych.  The patient with history of depression and steroid     psychosis.  The patient was  continued on her home Zoloft, Depakote     and Ativan and was also given Ativan p.r.n. for the steroid     psychosis. 3. Sinusitis.  The patient with diagnosis of sinusitis from [place].     She was continued on her home Augmentin.  DISCHARGE INSTRUCTIONS:  The patient was instructed to increase activity slowly and as tolerated.  FOLLOWUP:  The patient is to follow up with Dr. Dierdre Forth, outpatient rheumatology tomorrow, May 21.  The patient is to follow up with Dr. [name] in 1-2 weeks, sooner if needed.  DISCHARGE CONDITION:  The patient was discharged home in stable medical condition.    ______________________________ Demetria Pore, MD   ______________________________ Leighton Roach Neal Oshea, M.D.    JM/MEDQ  D:  03/31/2011  T:  03/31/2011  Job:  [id number]  cc:   Brett Canales A. [name], M.D. Alben Deeds, MD  Electronically Signed by Demetria Pore MD on 03/31/2011 05:41:40 PM Electronically Signed by Acquanetta Belling M.D. on 04/10/2011 08:45:31 AM

## 2011-04-10 NOTE — H&P (Signed)
Abigail [name], Abigail [name]               ACCOUNT NO.:  [id number]  MEDICAL RECORD NO.:  [id number]           PATIENT TYPE:  I  LOCATION:  6737                         FACILITY:  [facility]  PHYSICIAN:  Leighton Roach Karman Veney, M.D.DATE OF BIRTH:  05-31-1961  DATE OF ADMISSION:  03/29/2011 DATE OF DISCHARGE:                             HISTORY & PHYSICAL   PRIMARY CARE PROVIDER:  Stan [name]. Abigail Alberts, MD, at [facility].  RHEUMATOLOGISTS:  Dr. Dierdre [name] and Dr. Marliss Coots at [facility].  CHIEF COMPLAINTS:  Fevers, chills, myalgias.  HISTORY OF PRESENT ILLNESS:  This is a 50 year old female with known Sjogren disease recently discharged from our service on Mar 25, 2011, for a fever of unknown origin along with myalgias, arthralgias, and shaking chills.  The patient is now being readmitted for similar symptoms as a direct admit from [facility].  Per the patient, she continues having fever and shaking chills and riders.  The patient was seen last at [facility] after one of these episodes of rigors and was given ceftriaxone x1 per the patient.  This did improve her sleep overnight last night.  The patient over the past 24 hours has began having pain in her left knee and her neck; however, these symptoms appear to be as nonspecific as her other myalgias and arthralgias.  The patient's main complaint, however, are rigors.  The patient does note that she frequently has sweats especially in the middle of the night and these rigors have been always associated with a fever.  She has fever every time she has rigors, but does not have rigors every time she has a fever.  After the patient was at urgent care last night for these rigors, she was told to come back today for follow up, and Dr. Cleta [name] discussed her case with Dr. Dierdre [name] who is her rheumatologist and it was decided that the best next step would be attempting to start steroids; however, at Dr. Dierdre [name] was uncomfortable with this being done as an outpatient  as the patient still could have fever of unknown origin due to an infectious cause.  Therefore, it was suggested that the patient be admitted to the hospital for observation with the start of steroids. The patient states she is still having fevers daily ranging from 99-103 with her most recent being 99 to low 100 throughout the day today.  ALLERGIES: 1. DEMEROL causes tachycardia. 2. LASIX causes shortness of breath. 3. TETRACYCLINES and DOXYCYCLINE causes rash. 4. SULFA causes hives. 5. PHENERGAN causes vomiting.  HOME MEDICATIONS: 1. Zantac 75 mg p.o. daily. 2. Depakote 500 mg p.o. daily. 3. Clonazepam 1 mg p.o. at bedtime. 4. Zoloft 50 mg p.o. daily. 5. Estradiol 10 mg p.o. daily. 6. Augmentin 875 mg p.o. b.i.d.  PAST MEDICAL HISTORY: 1. Migraines. 2. Sjogren, diagnosed in 1990. 3. History of recurrent pancreatitis status post double     sphincterectomy. 4. GERD. 5. History of diverticulitis. 6. Depression.  PAST SURGICAL HISTORY: 1. Hysterectomy, ovaries are still intact. 2. Sphincterectomy x2. 3. Appendectomy. 4. Cholecystectomy. 5. Lower biopsy.  SOCIAL HISTORY:  The patient lives here in [place] with her husband. She  currently works at [employer] in [employer].  She denies tobacco, alcohol, or drug use.  FAMILY HISTORY:  The patient's mother and father are both alive and healthy at ages 73 and 54 respectively.  The patient also has healthy siblings.  Of note, the patient's daughter was diagnosed with neonatal lupus.  REVIEW OF SYSTEMS:  Positive for fevers, chills, sweats, fatigue, sore throat, myalgias, and arthralgias.  Negative for appetite changes, headache, ear pain, chest pain, cough, dyspnea, wheezing, nausea, vomiting, diarrhea, abdominal pain, dysuria, hematuria, nocturia, incontinence, rash, polyuria, polydipsia.  PHYSICAL EXAMINATION:  VITALS:  Temperature 99.6, blood pressure 136/81, pulse 85, respirations 20, pO2 97% on room  air. GENERAL:  No acute distress, lying in bed, texting comfortably. HEENT:  PERRLA.  Extraocular movements intact.  Mucous membranes slightly dry.  No exudate.  Questionable thrush along the patient's sides of tongue.  No lymphadenopathy. CARDIOVASCULAR:  Regular rate and rhythm.  No murmur. LUNGS:  Clear to auscultation bilaterally.  No wheezes or rhonchi. ABDOMEN:  Soft, nontender, nondistended.  Positive bowel sounds. GU:  No CVA tenderness. EXTREMITIES:  Warm and well perfused.  No edema, no rash or bite.  The patient complaining of left knee pain but left knee without erythema or swelling.  No tenderness to palpation.  Right knee also normal.  Right and left knee asymmetric. MUSCULOSKELETAL:  No joint swelling or tenderness.  LABORATORY DATA AND STUDIES:  Labs done at [facility] on 2011-04-09, CBC 6.4/11.1/84.2/184.  BMET 136/3.8/100/22/14/0.96/91.  White blood cell count trend 7.0 on Mar 25, 2011; 6.4 on 09-Apr-2011; 9.5 on Mar 28, 2011.  ESR was 85 on Mar 28, 2011, of note it was 11 on Mar 25, 2011. Urine and blood cultures from Mar 25, 2011, are negative.  Chest x-ray from Mar 24, 2011, negative.  CT abdomen and pelvis from Mar 29, 2011, a.m. was negative for obstructive uropathy or nephrolithiasis with fatty infiltration of liver.  ASSESSMENT AND PLAN:  This is a 50 year old female with Sjogren disease presenting with continued fevers x1 week as well as chills, arthralgias, myalgias, with a recent hospital discharge on Mar 25, 2011. 1. Fevers.  Still of unknown origin but most likely secondary to     rheumatological Sjogren flare.  The patient has had an extensive     infectious disease workup over the past week.  We will hold off on     cultures at this time, but we will obtain blood and urine cultures     if temperature does spike to greater than 101.5.  We will get a     baseline CBC with diff and CMET now and then a repeat CBC and BMET     in the morning with white blood  cell count will likely increase     with steroids.  The only antibiotics which will be continued at     this time is Augmentin which the patient came in for due to a sinus     infection which was diagnosed 2 days ago.  She has been on     Augmentin since Apr 09, 2011.  We will also give Tylenol p.r.n.     fever and discomfort per recommendations of Rheumatology (Dr.     Dierdre [name]).  We will start steroids and monitor.  We will start with     Solu-Medrol 40 mg IV daily.  If the patient has good response,     could potentially increase steroid dosing.  We  will likely be able     to change to p.o. steroids tomorrow.  Dr. Dierdre [name] is concerned with     starting steroids if the patient does have an occult infection that     has not yet been found he was concerned of a rapid infectious     spread.  We will monitor for signs and symptoms of infection.  The     patient will be in a telemetry bed. 2. Depression.  We will continue on Depakote, Zoloft, and clonazepam. 3. Gastroesophageal reflux disease.  We will continue home Protonix. 4. Questionable oral thrush.  Per the patient, she is having pain in     her mouth similar to when she has had thrush in the past.  We will     start nystatin mouthwash swish and spit four times per day.  Per     the patient, she often takes systemic fluconazole; however, at this     time since we are trying to differentiate between a rheumatologic     versus infectious cause of these fevers we will try to stick with     local treatment for this problem. 5. Fluids, electrolytes, nutrition, gastrointestinal.  Put the patient     on a regular diet.  The patient feels the IV fluids help with her     discomfort, so we will run normal saline at 50 mL per hour towards     the next 24 hours. 6. Prophylaxis.  Subcu heparin and Protonix. 7. Disposition.  Pending clinical improvement.  We will admit for     observation while administering first dose of IV steroids.  The      patient already has a followup in place with Dr. Dierdre [name] on Monday     Apr 01, 2011, and at [facility] on Wednesday, Apr 03, 2011.    ______________________________ Demetria Pore, MD   ______________________________ Leighton Roach Queena Monrreal, M.D.    JM/MEDQ  D:  03/29/2011  T:  03/30/2011  Job:  [id number]  Electronically Signed by Demetria Pore MD on 03/31/2011 05:41:25 PM Electronically Signed by Acquanetta Belling M.D. on 04/10/2011 08:45:27 AM

## 2011-04-12 NOTE — H&P (Signed)
NAME:  Abigail [name], Abigail [name] NO.:  [id number]  MEDICAL RECORD NO.:  [id number]           PATIENT TYPE:  I  LOCATION:                                FACILITY:  [facility]  PHYSICIAN:  Pearlean Brownie, M.D.DATE OF BIRTH:  1961/01/29  DATE OF ADMISSION:  03/24/2011 DATE OF DISCHARGE:  03/25/2011                             HISTORY & PHYSICAL   PRIMARY CARE PROVIDER:  [facility].  RHEUMATOLOGIST:  Dr. Gaye Pollack at [facility].  The patient has not seen this doctor for more than 10 years since she moved to [place] and then when she returned to [place] 2 years ago, she was not experiencing any flares.  Her appointment with Dr. Gaye Pollack is in July 2012.  The patient saw Dr. Clarita Crane in [place].  NEUROLOGIST:  Genene Churn. Love, MD  GI:  Hedwig Morton. Juanda Chance, MD  CHIEF COMPLAINT:  Myalgias, chills, fevers.  HISTORY OF PRESENT ILLNESS:  The patient was directly admitted from [facility] and transported via EMS.  For the past 2 weeks, the patient was experiencing worsening myalgias, shaking chills, arthralgias, and bilateral lower back pain.  Also fevers with temperatures 99 to 102 at home for the past 3 days.  Also worsening episodes of "brain phos."  Denies nausea, vomiting, diarrhea, constipation, respiratory symptoms.  Some dysuria with hematuria during this time that had resolved spontaneously.  7 years ago, the patient had been on Remicade for Sjogren's, that since this has been in remission, the patient has not needed medication for several years.  Sjogren's manifests for the patient in multiple ways; dyspnea, elevated LFTs, pancreatitis, petechiae.  Today, the patient is also very thirsty since she had not drank since 1500.  ALLERGIES:  DEMEROL causes tachycardia, LATEX causes shortness breath, TETRACYCLINES and DOXYCYCLINE causes rash, SULFA causes hives, PHENERGAN causes vomiting.  MEDICATIONS: 1. Zantac 75 mg daily 2. Depakote 500 mg daily. 3. Clonazepam 1 mg  at bedtime. 4. Chlorthalidone 25 mg daily. 5. Zoloft 50 mg daily. 6. Estradiol 10 mg daily.  PAST MEDICAL HISTORY: 1. Migraines. 2. Sjogren's diagnosed in 1990. 3. History of recurrent pancreatitis status post double     sphincterectomy at [facility]. 4. GERD. 5. History of diverticulitis. 6. New history of depression.  PAST SURGICAL HISTORY: 1. Hysterectomy with intact ovaries. 2. Sphincterotomy x2. 3. Appendectomy. 4. Cholecystectomy. 5. Liver biopsy.  SOCIAL HISTORY:  The patient lives with her husband in [place].  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  Tobacco none, alcohol none, drugs none.  FAMILY HISTORY:  Mother is healthy, 86 year old.  Father is healthy 53- year-old.  Siblings are healthy.  Daughter diagnosis with neonatal lupus.  REVIEW OF SYSTEMS:  Positive for fever, chills, dysuria, rash, myalgias, and arthralgias.  Negative for changes in appetite, headache, chest pain, cough, dyspnea, nausea, vomiting, diarrhea, abdominal pain, constipation, "the patient had a bowel movement yesterday" hematuria, incontinence, swelling, visual changes, and petechiae.  PHYSICAL EXAMINATION:  VITAL SIGNS:  Temperature 99.2, pulse 109, respiratory rate 17, blood pressure 134/80, pO2 94% on room air. GENERAL:  No apparent distress, lying flat in bed. EYES:  Pupils equal, round, reactive to light and accommodation. Bloodshot watery eyes. MOUTH:  Dry with no oropharyngeal lesions. CARDIOVASCULAR:  Regular rate and rhythm with no murmurs, rubs, or gallops. LUNGS:  No increased work of breathing, good aeration, questionable crackles at bases. ABDOMEN:  Normoactive bowel sounds, soft, nontender, nondistended, no tenderness to palpation with no guarding.  Abdominal surgery scar in lateral and lower transverse well healed. BACK:  Mild noticed to palpation in lumbar paraspinous areas bilaterally with no CVA tenderness to palpation and no midline tenderness  to palpation. EXTREMITIES:  No pretibial or pedal edema. NEURO:  Fully alert and oriented and appropriate to questions with no focal deficits. SKIN:  Blanchable erythematous, maculopapular non-warm rash with few overlying scars non-weeping in the right shin, 3-4 second cap refill.  LABS AND STUDIES:  Following labs were done at [facility]; urinalysis normal.  Urine microscopic 10-12 red blood cells, 0-3 white blood cells, 0-2 epi's, trace bacteria, trace mucus, ESR 54.  White blood count around 10 with 82% neutrophils.  Labs at [facility] on March 08, 2011, showed white blood count 4.3, hemoglobin 12.4, platelets 252.  ASSESSMENT/PLAN:  This is a 50 year old female with past medical history significant for Sjogren syndrome and gastroesophageal reflux disease status post multiple abdominal surgeries presenting with vague symptoms including myalgias, chills, fevers at home, and bilateral lower back pain. 1. Myalgias, chills, fevers at home, lower back pain.  Unclear     etiology, inclination this is attributed to history to Sjogren     especially in light of elevated ESR, multiple vague symptoms, and     no signs of obvious infection.  The patient has been normal;     however, in the higher end of normal white blood count with no     obvious source.  The patient is afebrile here.  We will make sure     that the patient has no infection and check chest x-rays for     pneumonia, repeat urinalysis and urine culture, blood culture.  We     will check CMET and CBC now and we will recheck in the morning with     ESR and CRP.  No signs of UTI based on labs done at [facility] and     diverticular disease, currently is unlikely with absence of     symptoms. 2. History of Sjogren's.  If the study show no signs of infection,     suspected Sjogren's flare, we will start prednisone.  We will also     give the patient Artificial Tears now and rehydrate the patient     with IV fluids.  The patient denies  morning fluid bolus secondary     to being told that she has vasculitis and she had rapid infusions     per previous doctors.  She is taking good p.o., we will rehydrate     with normal saline at 200 mL per hour until the 500 mL bolus is     completed then at 70 mL per hour x24 hours. 3. History of migraines.  Continue home Depakote. 4. History of gastroesophageal reflux disease.  Continue home Zantac. 5. Fluid, electrolytes, and nutrition/gastrointestinal.  See number 2.     Regular diet. 6. History of hypertension.  We will hold diuretics for now since the     patient appears dry.  We will give hydralazine 10 mg IV p.r.n. q.6     h. for blood pressure greater than 160/100. 7. Prophylaxis.  Heparin  subcu 5000 units t.i.d.  Home Zantac for GERD     prophylaxis. 8. Disposition pending clinical improvement.    ______________________________ Priscella Mann, MD   ______________________________ Pearlean Brownie, M.D.    AO/MEDQ  D:  03/24/2011  T:  03/25/2011  Job:  [id number]  Electronically Signed by Priscella Mann MD on 03/29/2011 03:25:39 PM Electronically Signed by Pearlean Brownie M.D. on 04/12/2011 01:37:14 PM

## 2011-04-12 NOTE — Discharge Summary (Signed)
Abigail [name], Abigail [name]               ACCOUNT NO.:  [id number]  MEDICAL RECORD NO.:  [id number]            PATIENT TYPE:  LOCATION:                                 FACILITY:  PHYSICIAN:  Pearlean Brownie, M.D.    DATE OF BIRTH:  DATE OF ADMISSION:  03/24/2011 DATE OF DISCHARGE:  03/25/2011                              DISCHARGE SUMMARY   REASON FOR HOSPITALIZATION:  Fever of unknown origin with associated myalgias, arthralgias, and shaking chills with concern for infection versus Sjogren flare.  DIFFERENTIAL DIAGNOSES: 1. Viral infection versus Sjogren flare. 2. Hypokalemia.  MEDICATIONS:  New Medications: 1. Tylenol 650 mg p.o. q.6 h. p.r.n. 2. Artificial Tears 1 drop in both eyes twice daily as needed for dry     eyes. 3. Zofran 4 mg p.o. q.8 h. p.r.n.  Continue Home Medications: 1. Depakote 500 mg p.o. daily. 2. Estradiol 1 mg p.o. daily. 3. Lorazepam 1 mg p.o. at bedtime. 4. Zantac 75 mg p.o. daily. 5. Zoloft 50 mg p.o. daily.  DISCONTINUED MEDICATIONS:  Chlorthalidone 25 mg p.o. daily.  CONSULTS:  None.  PROCEDURES:  Chest x-ray showed no acute cardiopulmonary abnormality.  PERTINENT LABS:  White blood count on admission 8.7, potassium on admission 2.2, potassium on hospital day 2 of 3.0.  Urinalysis abnormal for large blood.  Urine microscopic abnormal for 11-20 white blood cells and few bacteria with 0-2 white blood cells.  ESR 40.  C-reactive protein 10.8.  Blood cultures negative x2.  Urine culture, no growth.  BRIEF HOSPITAL COURSE:  This is a 50 year old female with a history of Sjogren disease presenting with multiple vague complaints including fevers at home of 99-102 and documented fever at her PCP's office of 101 and myalgias, arthralgias, and shaking chills.  The patient was admitted for observation. 1. Fever of unknown origin with myalgias, arthralgias, and shaking     chills.  Concern for infection due to elevated ESR PCP's office of     54 and  fevers.  However, white blood count here was normal, ESR had     trended down to 40 by the next day, chest x-ray was negative, urine     study showed unlikely UTI especially in light of having no urinary     symptoms.  Urine and blood cultures were negative.  It was     considered that these symptoms may have been attributed to her     Sjogren's, which have been in remission for the past 10 years.     However, besides the conjunctival and oral dryness, which resolved     with fluids, the patient did not have any obvious signs of Sjogren     flare including no joint swelling, erythema, or remarkable     tenderness to palpation, no lymphadenopathy, no respiratory     abnormality.  Steroids were not started during her hospitalization     in the event that the patient may still have a bacterial infection     as blood in urine cultures were still pending by the time of     discharge or viral infection that would  resolve on its own.  The     patient was afebrile on admission and during her hospitalization. 2. Dehydration.  The patient appeared dehydrated with very dry mucous     membranes and dry eyes, although her capillary refill and blood     pressure were good and the patient was not tachycardic.  The     patient was given a fluid bolus and half maintenance IV fluids     overnight and she was unable to take good p.o. 3. Hypokalemia.  Potassium on admission was 2.2.  The patient had no     ECG abnormalities.  Potassium was repleted overnight and improved     to 3.0 by the next morning.  The patient felt significantly better     the following day following fluid and potassium repletion.     Hypokalemia was attributed to the patient's chlorthalidone, which     she had been taking for the past few months for elevated blood     pressures.  This medication was held during her hospitalization and     the patient was asked to stop taking this medication at the time of     discharge. 4. Hematuria.   The patient did not have frank blood in her urine, but     her urine urinalysis and urine microscopic were positive for blood.     The patient no longer has periods at this time.  There was concern     that the blood may have been due to her urine infection; however,     cultures were negative.  The patient was asked to follow up with     her PCP regarding this problem and to get a repeat urine studies     when the patient was well.  DISCHARGE INSTRUCTIONS:  The patient was asked to follow up with her PCP in 1 week.  FOLLOWUP ISSUES: 1. Consider starting the patient on steroids if symptoms not improved     over the next several days for possible Sjogren flare especially in     light of negative cultures. 2. Recommended repeat urinalysis and urine microscopic since the     patient presented with blood in the urine to make sure this has     resolved.  If still has hematuria, consider Urology consult as an     outpatient especially with the patient's history of interstitial     nephritis. 3. PCP may consider changing the patient's blood pressure medication     chlorthalidone to another antihypertensive, especially with the     patient's history of Sjogren, it makes it susceptible to     dehydration and in light of the patient's significant hypokalemia     on this medication.    ______________________________ Priscella Mann, MD   ______________________________ Pearlean Brownie, M.D.    AO/MEDQ  D:  03/28/2011  T:  03/29/2011  Job:  [id number]  cc:   Brett Canales A. [name], M.D.  Electronically Signed by Priscella Mann MD on 04/05/2011 11:34:53 PM Electronically Signed by Pearlean Brownie M.D. on 04/12/2011 01:37:22 PM

## 2011-04-15 ENCOUNTER — Emergency Department (HOSPITAL_COMMUNITY)
Admission: EM | Admit: 2011-04-15 | Discharge: 2011-04-15 | Disposition: A | Discharge status: Home / Self Care | Payer: Medicare Other | Attending: Emergency Medicine | Admitting: Emergency Medicine

## 2011-04-15 DIAGNOSIS — J45909 Unspecified asthma, uncomplicated: Secondary | ICD-10-CM | POA: Insufficient documentation

## 2011-04-15 DIAGNOSIS — Z79899 Other long term (current) drug therapy: Secondary | ICD-10-CM | POA: Insufficient documentation

## 2011-04-15 DIAGNOSIS — R112 Nausea with vomiting, unspecified: Secondary | ICD-10-CM | POA: Insufficient documentation

## 2011-04-15 DIAGNOSIS — R197 Diarrhea, unspecified: Secondary | ICD-10-CM | POA: Insufficient documentation

## 2011-04-15 DIAGNOSIS — I10 Essential (primary) hypertension: Secondary | ICD-10-CM | POA: Insufficient documentation

## 2011-04-15 LAB — DIFFERENTIAL
Eosinophils Relative: 1 % (ref 0–5)
Lymphs Abs: 0.7 10*3/uL (ref 0.7–4.0)
Monocytes Absolute: 0.5 10*3/uL (ref 0.1–1.0)
Monocytes Relative: 4 % (ref 3–12)
Neutro Abs: 11.1 10*3/uL — ABNORMAL HIGH (ref 1.7–7.7)

## 2011-04-15 LAB — URINALYSIS, ROUTINE W REFLEX MICROSCOPIC
Bilirubin Urine: NEGATIVE
Ketones, ur: NEGATIVE mg/dL
Nitrite: NEGATIVE
Protein, ur: NEGATIVE mg/dL
Urobilinogen, UA: 1 mg/dL (ref 0.0–1.0)

## 2011-04-15 LAB — CBC
HCT: 40.1 % (ref 36.0–46.0)
Hemoglobin: 13.5 g/dL (ref 12.0–15.0)
MCH: 28.3 pg (ref 26.0–34.0)
MCHC: 33.7 g/dL (ref 30.0–36.0)
RDW: 14.2 % (ref 11.5–15.5)

## 2011-04-15 LAB — BASIC METABOLIC PANEL
BUN: 21 mg/dL (ref 6–23)
CO2: 23 mEq/L (ref 19–32)
Calcium: 8.1 mg/dL — ABNORMAL LOW (ref 8.4–10.5)
Creatinine, Ser: 0.74 mg/dL (ref 0.4–1.2)
GFR calc non Af Amer: >60 mL/min (ref >60)
Glucose, Bld: 124 mg/dL — ABNORMAL HIGH (ref 70–99)

## 2011-04-15 LAB — URINE MICROSCOPIC-ADD ON

## 2011-05-21 ENCOUNTER — Other Ambulatory Visit: Payer: Self-pay | Admitting: Family Medicine

## 2011-05-23 ENCOUNTER — Ambulatory Visit (HOSPITAL_COMMUNITY)
Admission: RE | Admit: 2011-05-23 | Discharge: 2011-05-23 | Disposition: A | Discharge status: Home / Self Care | Payer: Medicare Other | Source: Ambulatory Visit | Attending: Family Medicine | Admitting: Family Medicine

## 2011-05-23 DIAGNOSIS — I10 Essential (primary) hypertension: Secondary | ICD-10-CM | POA: Insufficient documentation

## 2011-05-23 DIAGNOSIS — L930 Discoid lupus erythematosus: Secondary | ICD-10-CM | POA: Insufficient documentation

## 2011-05-23 DIAGNOSIS — R3129 Other microscopic hematuria: Secondary | ICD-10-CM | POA: Insufficient documentation

## 2011-05-28 NOTE — Consult Note (Signed)
NAMEWILLEAN, [name]               ACCOUNT NO.:  [id number]  MEDICAL RECORD NO.:  [id number]  LOCATION:  [facility]                         FACILITY:  [facility]  PHYSICIAN:  Abigail [name], M.D.DATE OF BIRTH:  Aug 02, 1961  DATE OF CONSULTATION:  04/15/2011 DATE OF DISCHARGE:  04/15/2011                                CONSULTATION   PRIMARY CARE PHYSICIAN:  Dr. Cleta [name] at [facility].  CHIEF COMPLAINT:  Nausea, vomiting, diarrhea x1 day.  HISTORY OF PRESENT ILLNESS:  The patient presents with 1-day of nausea, vomiting, and diarrhea.  She woke up this morning with nausea and then began to vomit.  She started to have watery stool as well and is unable to control her bowels and she would have a bowel movement whenever she vomited.  She is unable to hold down her Zofran pills that she had at home and she was also unable to hold down her prednisone which she takes for Sjogren syndrome.  In the hospital, she received 2.5 liters of fluid, Zofran IV and Ativan IV, which she says helped her a lot with her symptoms.  When I saw her she had not vomited x2 hours and she was drinking some ginger ale.  She did complain of abdominal pain, but denied fevers or myalgias.  Her husband had a 24-hour gastroenteritis several days previous which she states is similar in symptoms.  ALLERGIES:  DEMEROL, LASIX, TETRACYCLINE, DOXYCYCLINE, SULFA, AND PHENERGAN.  MEDICATIONS: 1. Depakote 500 mg p.o. daily. 2. Estradiol 1 mg p.o. daily. 3. Ativan 1 mg p.o. nightly. 4. Zantac 75 mg p.o. daily. 5. Zoloft 50 mg p.o. daily.  PAST MEDICAL HISTORY: 1. Migraine. 2. Sjogren's. 3. History of recurrent pancreatitis. 4. Status post double sphincterectomy. 5. Diverticulitis. 6. Depression.  PAST SURGICAL HISTORY: 1. Hysterectomy, still has ovaries. 2. Appendectomy. 3. Cholecystectomy.  HISTORY:  She lives with her husband.  She works at [employer] in [employer]  She does not smoke, drink or use  drugs.  FAMILY HISTORY:  Her family is significant for mother and father which are healthy and siblings which are healthy.  REVIEW OF SYSTEMS:  She denies fever, chills, sweats, headaches, chest pain, cough, dyspnea, hematemesis, rash, myalgias, arthralgias or visual changes.  She endorses nausea, vomiting, diarrhea and abdominal pain.  PHYSICAL EXAMINATION:  VITAL SIGNS:  Temperature 97.2, pulse 102, respirations 18, blood pressure 131/82, satting 92% on room air. GENERAL:  She is in no acute distress, lying on the bed and is talking and laughing some in conversation. HEENT:  Moist mucous membranes.  Pupils equal, round and reactive to light. NECK:  Supple. HEART:  Regular rate and rhythm. LUNGS:  Clear to auscultation bilaterally. ABDOMEN:  Diffusely tender to palpation.  Bowel sounds are positive. EXTREMITIES:  Warm.  No edema. NEURO:  Alert and oriented x3.  LABS AND STUDIES:  BMET was within normal limits and CBC showed a mildly elevated white count of 12.4.  ASSESSMENT/PLAN:  This is a 50 year old with Sjogren syndrome, who is here for 1-day of nausea, vomiting, and diarrhea. 1. Nausea, vomiting, diarrhea.  This is very likely to be unrelated to     her previous illnesses.  Symptoms x1 day with a normal BMET.  She     received Solu-Medrol in the ED as well as Zofran and IV fluids and     Ativan.  Symptoms are now greatly improved.  Discuss this patient     with Dr. Leveda [name] who agrees with discharge from the ED with Zofran     ODT and follow up tomorrow at [facility].  The patient is     agreeable with plan. 2. Sjogren's.  Stable on prednisone.  Follow up with Rheumatology.     Ellery Plunk, MD   ______________________________ Abigail Bumpers Abigail [name], M.D.    RS/MEDQ  D:  04/15/2011  T:  04/16/2011  Job:  [id number]  Electronically Signed by Ellery Plunk  on 05/22/2011 11:40:56 AM Electronically Signed by Doralee Albino M.D. on 05/28/2011 07:45:28 AM

## 2011-09-11 DIAGNOSIS — M358 Other specified systemic involvement of connective tissue: Secondary | ICD-10-CM | POA: Insufficient documentation

## 2011-09-11 DIAGNOSIS — M351 Other overlap syndromes: Secondary | ICD-10-CM | POA: Insufficient documentation

## 2011-10-07 ENCOUNTER — Other Ambulatory Visit: Payer: Self-pay | Admitting: Family Medicine

## 2011-10-07 DIAGNOSIS — Z1231 Encounter for screening mammogram for malignant neoplasm of breast: Secondary | ICD-10-CM

## 2011-10-17 ENCOUNTER — Ambulatory Visit (INDEPENDENT_AMBULATORY_CARE_PROVIDER_SITE_OTHER): Payer: Medicare Other

## 2011-10-17 DIAGNOSIS — S139XXA Sprain of joints and ligaments of unspecified parts of neck, initial encounter: Secondary | ICD-10-CM

## 2011-10-17 DIAGNOSIS — M542 Cervicalgia: Secondary | ICD-10-CM

## 2011-10-17 DIAGNOSIS — R209 Unspecified disturbances of skin sensation: Secondary | ICD-10-CM

## 2011-10-22 ENCOUNTER — Other Ambulatory Visit: Payer: Self-pay | Admitting: Internal Medicine

## 2011-10-22 DIAGNOSIS — M542 Cervicalgia: Secondary | ICD-10-CM

## 2011-10-23 ENCOUNTER — Ambulatory Visit (HOSPITAL_COMMUNITY)
Admission: RE | Admit: 2011-10-23 | Discharge: 2011-10-23 | Disposition: A | Discharge status: Home / Self Care | Payer: Medicare Other | Source: Ambulatory Visit | Attending: Internal Medicine | Admitting: Internal Medicine

## 2011-10-23 DIAGNOSIS — M542 Cervicalgia: Secondary | ICD-10-CM

## 2011-10-23 DIAGNOSIS — R209 Unspecified disturbances of skin sensation: Secondary | ICD-10-CM | POA: Insufficient documentation

## 2011-10-23 DIAGNOSIS — M5020 Other cervical disc displacement, unspecified cervical region: Secondary | ICD-10-CM | POA: Insufficient documentation

## 2011-10-28 ENCOUNTER — Other Ambulatory Visit: Payer: Self-pay | Admitting: Neurosurgery

## 2011-10-28 ENCOUNTER — Encounter (HOSPITAL_COMMUNITY): Payer: Self-pay | Admitting: *Deleted

## 2011-10-28 ENCOUNTER — Encounter (HOSPITAL_COMMUNITY): Payer: Self-pay

## 2011-10-28 MED ORDER — CEFAZOLIN SODIUM-DEXTROSE 2-3 GM-% IV SOLR
2.0000 g | INTRAVENOUS | Status: DC
  Filled 2011-10-28: qty 50

## 2011-10-28 NOTE — Progress Notes (Signed)
Requested EKG, labs from [facility].

## 2011-10-29 ENCOUNTER — Encounter (HOSPITAL_COMMUNITY): Payer: Self-pay | Admitting: *Deleted

## 2011-10-29 ENCOUNTER — Ambulatory Visit (HOSPITAL_COMMUNITY): Payer: Medicare Other | Admitting: Anesthesiology

## 2011-10-29 ENCOUNTER — Encounter (HOSPITAL_COMMUNITY): Payer: Self-pay | Admitting: Anesthesiology

## 2011-10-29 ENCOUNTER — Inpatient Hospital Stay (HOSPITAL_COMMUNITY)
Admission: RE | Admit: 2011-10-29 | Discharge: 2011-10-30 | DRG: 473 | Disposition: A | Discharge status: Home / Self Care | Payer: Medicare Other | Source: Ambulatory Visit | Attending: Neurosurgery | Admitting: Neurosurgery

## 2011-10-29 ENCOUNTER — Encounter (HOSPITAL_COMMUNITY)
Admission: RE | Disposition: A | Discharge status: Home / Self Care | Payer: Self-pay | Source: Ambulatory Visit | Attending: Neurosurgery

## 2011-10-29 ENCOUNTER — Ambulatory Visit (HOSPITAL_COMMUNITY): Payer: Medicare Other

## 2011-10-29 ENCOUNTER — Other Ambulatory Visit: Payer: Self-pay

## 2011-10-29 DIAGNOSIS — M5412 Radiculopathy, cervical region: Secondary | ICD-10-CM | POA: Diagnosis present

## 2011-10-29 DIAGNOSIS — Z888 Allergy status to other drugs, medicaments and biological substances status: Secondary | ICD-10-CM

## 2011-10-29 DIAGNOSIS — I10 Essential (primary) hypertension: Secondary | ICD-10-CM | POA: Diagnosis present

## 2011-10-29 DIAGNOSIS — Z882 Allergy status to sulfonamides status: Secondary | ICD-10-CM

## 2011-10-29 DIAGNOSIS — M329 Systemic lupus erythematosus, unspecified: Secondary | ICD-10-CM | POA: Diagnosis present

## 2011-10-29 DIAGNOSIS — J45909 Unspecified asthma, uncomplicated: Secondary | ICD-10-CM | POA: Diagnosis present

## 2011-10-29 DIAGNOSIS — Z85820 Personal history of malignant melanoma of skin: Secondary | ICD-10-CM

## 2011-10-29 DIAGNOSIS — K219 Gastro-esophageal reflux disease without esophagitis: Secondary | ICD-10-CM | POA: Diagnosis present

## 2011-10-29 DIAGNOSIS — M5020 Other cervical disc displacement, unspecified cervical region: Principal | ICD-10-CM | POA: Diagnosis present

## 2011-10-29 DIAGNOSIS — Z91040 Latex allergy status: Secondary | ICD-10-CM

## 2011-10-29 HISTORY — PX: ANTERIOR CERVICAL DECOMP/DISCECTOMY FUSION: SHX1161

## 2011-10-29 HISTORY — DX: Adverse effect of unspecified anesthetic, initial encounter: T41.45XA

## 2011-10-29 HISTORY — DX: Reserved for concepts with insufficient information to code with codable children: IMO0002

## 2011-10-29 HISTORY — DX: Other complications of anesthesia, initial encounter: T88.59XA

## 2011-10-29 HISTORY — DX: Essential (primary) hypertension: I10

## 2011-10-29 HISTORY — DX: Systemic lupus erythematosus, unspecified: M32.9

## 2011-10-29 HISTORY — DX: Gastro-esophageal reflux disease without esophagitis: K21.9

## 2011-10-29 LAB — BASIC METABOLIC PANEL
CO2: 30 mEq/L (ref 19–32)
Calcium: 9.2 mg/dL (ref 8.4–10.5)
Sodium: 140 mEq/L (ref 135–145)

## 2011-10-29 LAB — CBC
MCV: 85.7 fL (ref 78.0–100.0)
Platelets: 226 10*3/uL (ref 150–400)
RBC: 4.88 MIL/uL (ref 3.87–5.11)
WBC: 8.2 10*3/uL (ref 4.0–10.5)

## 2011-10-29 LAB — SURGICAL PCR SCREEN: MRSA, PCR: NEGATIVE

## 2011-10-29 SURGERY — ANTERIOR CERVICAL DECOMPRESSION/DISCECTOMY FUSION 2 LEVELS
Anesthesia: General | Site: Spine Cervical | Wound class: Clean

## 2011-10-29 MED ORDER — HEMOSTATIC AGENTS (NO CHARGE) OPTIME
TOPICAL | Status: DC | PRN
  Administered 2011-10-29: 1 via TOPICAL

## 2011-10-29 MED ORDER — SODIUM CHLORIDE 0.9 % IJ SOLN: 3.0000 mL | Freq: Two times a day (BID) | INTRAMUSCULAR | Status: DC

## 2011-10-29 MED ORDER — PHENOL 1.4 % MT LIQD
1.0000 | OROMUCOSAL | Status: DC | PRN
  Administered 2011-10-30: 1 via OROMUCOSAL
  Filled 2011-10-29: qty 177

## 2011-10-29 MED ORDER — CEFAZOLIN SODIUM 1-5 GM-% IV SOLN
1.0000 g | Freq: Three times a day (TID) | INTRAVENOUS | Status: DC
  Filled 2011-10-29 (×2): qty 50

## 2011-10-29 MED ORDER — DIAZEPAM 5 MG PO TABS
5.0000 mg | ORAL_TABLET | Freq: Four times a day (QID) | ORAL | Status: DC | PRN
  Administered 2011-10-29 – 2011-10-30 (×2): 5 mg via ORAL
  Filled 2011-10-29 (×2): qty 1

## 2011-10-29 MED ORDER — MEPERIDINE HCL 25 MG/ML IJ SOLN: 6.2500 mg | INTRAMUSCULAR | Status: DC | PRN

## 2011-10-29 MED ORDER — GLYCOPYRROLATE 0.2 MG/ML IJ SOLN
INTRAMUSCULAR | Status: DC | PRN
  Administered 2011-10-29: .8 mg via INTRAVENOUS

## 2011-10-29 MED ORDER — ONDANSETRON HCL 4 MG/2ML IJ SOLN: 4.0000 mg | INTRAMUSCULAR | Status: DC | PRN

## 2011-10-29 MED ORDER — MORPHINE SULFATE 2 MG/ML IJ SOLN: 0.0500 mg/kg | INTRAMUSCULAR | Status: DC | PRN

## 2011-10-29 MED ORDER — PHENOL 1.4 % MT LIQD: 1.0000 | OROMUCOSAL | Status: DC | PRN

## 2011-10-29 MED ORDER — MIDAZOLAM HCL 5 MG/5ML IJ SOLN
INTRAMUSCULAR | Status: DC | PRN
  Administered 2011-10-29: 2 mg via INTRAVENOUS

## 2011-10-29 MED ORDER — SODIUM CHLORIDE 0.9 % IJ SOLN: 3.0000 mL | INTRAMUSCULAR | Status: DC | PRN

## 2011-10-29 MED ORDER — 0.9 % SODIUM CHLORIDE (POUR BTL) OPTIME
TOPICAL | Status: DC | PRN
  Administered 2011-10-29: 1000 mL

## 2011-10-29 MED ORDER — MENTHOL 3 MG MT LOZG: 1.0000 | LOZENGE | OROMUCOSAL | Status: DC | PRN

## 2011-10-29 MED ORDER — CEFAZOLIN SODIUM 1-5 GM-% IV SOLN
INTRAVENOUS | Status: DC | PRN
  Administered 2011-10-29: 2 g via INTRAVENOUS

## 2011-10-29 MED ORDER — LACTATED RINGERS IV SOLN
INTRAVENOUS | Status: DC | PRN
  Administered 2011-10-29 (×2): via INTRAVENOUS

## 2011-10-29 MED ORDER — FENTANYL CITRATE 0.05 MG/ML IJ SOLN
INTRAMUSCULAR | Status: DC | PRN
  Administered 2011-10-29: 100 ug via INTRAVENOUS
  Administered 2011-10-29: 50 ug via INTRAVENOUS
  Administered 2011-10-29: 100 ug via INTRAVENOUS

## 2011-10-29 MED ORDER — ZOLPIDEM TARTRATE 5 MG PO TABS: 10.0000 mg | ORAL_TABLET | Freq: Every evening | ORAL | Status: DC | PRN

## 2011-10-29 MED ORDER — PROPOFOL 10 MG/ML IV BOLUS
INTRAVENOUS | Status: DC | PRN
  Administered 2011-10-29: 200 mg via INTRAVENOUS

## 2011-10-29 MED ORDER — SODIUM CHLORIDE 0.9 % IV SOLN
INTRAVENOUS | Status: DC
  Administered 2011-10-29: 23:00:00 via INTRAVENOUS

## 2011-10-29 MED ORDER — DEXAMETHASONE SODIUM PHOSPHATE 4 MG/ML IJ SOLN
4.0000 mg | Freq: Four times a day (QID) | INTRAMUSCULAR | Status: DC
  Administered 2011-10-30 (×2): 4 mg via INTRAVENOUS
  Filled 2011-10-29 (×6): qty 1

## 2011-10-29 MED ORDER — MUPIROCIN 2 % EX OINT
TOPICAL_OINTMENT | CUTANEOUS | Status: AC
  Filled 2011-10-29: qty 22

## 2011-10-29 MED ORDER — ONDANSETRON HCL 4 MG/2ML IJ SOLN
INTRAMUSCULAR | Status: DC | PRN
  Administered 2011-10-29: 4 mg via INTRAVENOUS

## 2011-10-29 MED ORDER — ACETAMINOPHEN 325 MG PO TABS: 650.0000 mg | ORAL_TABLET | ORAL | Status: DC | PRN

## 2011-10-29 MED ORDER — DEXAMETHASONE SODIUM PHOSPHATE 4 MG/ML IJ SOLN
INTRAMUSCULAR | Status: DC | PRN
  Administered 2011-10-29: 8 mg via INTRAVENOUS

## 2011-10-29 MED ORDER — ONDANSETRON HCL 4 MG/2ML IJ SOLN
4.0000 mg | INTRAMUSCULAR | Status: DC | PRN
Start: 2011-10-29 — End: 2011-10-29

## 2011-10-29 MED ORDER — MENTHOL 3 MG MT LOZG
1.0000 | LOZENGE | OROMUCOSAL | Status: DC | PRN
  Administered 2011-10-30: 3 mg via ORAL
  Filled 2011-10-29: qty 9

## 2011-10-29 MED ORDER — DOCUSATE SODIUM 100 MG PO CAPS: 100.0000 mg | ORAL_CAPSULE | Freq: Two times a day (BID) | ORAL | Status: DC

## 2011-10-29 MED ORDER — ACETAMINOPHEN 650 MG RE SUPP: 650.0000 mg | RECTAL | Status: DC | PRN

## 2011-10-29 MED ORDER — CEFAZOLIN SODIUM 1-5 GM-% IV SOLN
1.0000 g | Freq: Three times a day (TID) | INTRAVENOUS | Status: AC
  Administered 2011-10-29 – 2011-10-30 (×2): 1 g via INTRAVENOUS
  Filled 2011-10-29 (×2): qty 50

## 2011-10-29 MED ORDER — THROMBIN 5000 UNITS EX KIT
PACK | OROMUCOSAL | Status: DC | PRN
  Administered 2011-10-29: 19:00:00 via TOPICAL

## 2011-10-29 MED ORDER — HYDROMORPHONE HCL PF 1 MG/ML IJ SOLN
0.2500 mg | INTRAMUSCULAR | Status: DC | PRN
  Administered 2011-10-29 (×4): 0.5 mg via INTRAVENOUS

## 2011-10-29 MED ORDER — MORPHINE SULFATE 4 MG/ML IJ SOLN
4.0000 mg | INTRAMUSCULAR | Status: DC | PRN
  Administered 2011-10-29 – 2011-10-30 (×2): 4 mg via INTRAVENOUS
  Filled 2011-10-29 (×2): qty 1

## 2011-10-29 MED ORDER — NEOSTIGMINE METHYLSULFATE 1 MG/ML IJ SOLN
INTRAMUSCULAR | Status: DC | PRN
  Administered 2011-10-29: 5 mg via INTRAVENOUS

## 2011-10-29 MED ORDER — ONDANSETRON HCL 4 MG/2ML IJ SOLN: 4.0000 mg | Freq: Once | INTRAMUSCULAR | Status: DC | PRN

## 2011-10-29 MED ORDER — ROCURONIUM BROMIDE 100 MG/10ML IV SOLN
INTRAVENOUS | Status: DC | PRN
  Administered 2011-10-29: 50 mg via INTRAVENOUS

## 2011-10-29 MED ORDER — HYDROMORPHONE HCL PF 1 MG/ML IJ SOLN
INTRAMUSCULAR | Status: AC
  Filled 2011-10-29: qty 1

## 2011-10-29 MED ORDER — THROMBIN 5000 UNITS EX KIT
PACK | CUTANEOUS | Status: DC | PRN
  Administered 2011-10-29: 5000 [IU] via TOPICAL

## 2011-10-29 MED ORDER — DEXAMETHASONE 4 MG PO TABS
4.0000 mg | ORAL_TABLET | Freq: Four times a day (QID) | ORAL | Status: DC
  Filled 2011-10-29 (×4): qty 1

## 2011-10-29 MED ORDER — OXYCODONE-ACETAMINOPHEN 5-325 MG PO TABS: 1.0000 | ORAL_TABLET | ORAL | Status: DC | PRN

## 2011-10-29 MED ORDER — OXYCODONE-ACETAMINOPHEN 5-325 MG PO TABS
2.0000 | ORAL_TABLET | ORAL | Status: DC | PRN
  Administered 2011-10-30: 2 via ORAL
  Filled 2011-10-29: qty 2

## 2011-10-29 SURGICAL SUPPLY — 71 items
BANDAGE GAUZE ELAST BULKY 4 IN (GAUZE/BANDAGES/DRESSINGS) ×4 IMPLANT
BENZOIN TINCTURE PRP APPL 2/3 (GAUZE/BANDAGES/DRESSINGS) ×2 IMPLANT
BIT DRILL SM SPINE QC 12 (BIT) ×2 IMPLANT
BLADE ULTRA TIP 2M (BLADE) ×2 IMPLANT
BUR BARREL STRAIGHT FLUTE 4.0 (BURR) ×2 IMPLANT
BUR MATCHSTICK NEURO 3.0 LAGG (BURR) ×2 IMPLANT
CANISTER SUCTION 2500CC (MISCELLANEOUS) ×2 IMPLANT
CLOSURE STERI STRIP 1/2 X4 (GAUZE/BANDAGES/DRESSINGS) ×2 IMPLANT
CLOTH BEACON ORANGE TIMEOUT ST (SAFETY) ×2 IMPLANT
CONT SPEC 4OZ CLIKSEAL STRL BL (MISCELLANEOUS) ×2 IMPLANT
COVER MAYO STAND STRL (DRAPES) ×2 IMPLANT
DRAPE LAPAROTOMY 100X72 PEDS (DRAPES) ×2 IMPLANT
DRAPE MICROSCOPE LEICA (MISCELLANEOUS) ×2 IMPLANT
DRAPE POUCH INSTRU U-SHP 10X18 (DRAPES) ×2 IMPLANT
DURAPREP 6ML APPLICATOR 50/CS (WOUND CARE) ×2 IMPLANT
ELECT REM PT RETURN 9FT ADLT (ELECTROSURGICAL) ×2
ELECTRODE REM PT RTRN 9FT ADLT (ELECTROSURGICAL) ×1 IMPLANT
GAUZE SPONGE 4X4 16PLY XRAY LF (GAUZE/BANDAGES/DRESSINGS) IMPLANT
GLOVE BIO SURGEON STRL SZ 6.5 (GLOVE) IMPLANT
GLOVE BIO SURGEON STRL SZ7 (GLOVE) IMPLANT
GLOVE BIO SURGEON STRL SZ7.5 (GLOVE) IMPLANT
GLOVE BIO SURGEON STRL SZ8 (GLOVE) IMPLANT
GLOVE BIO SURGEON STRL SZ8.5 (GLOVE) IMPLANT
GLOVE BIOGEL M 8.0 STRL (GLOVE) IMPLANT
GLOVE BIOGEL PI IND STRL 6.5 (GLOVE) ×1 IMPLANT
GLOVE BIOGEL PI IND STRL 8 (GLOVE) ×1 IMPLANT
GLOVE BIOGEL PI INDICATOR 6.5 (GLOVE) ×1
GLOVE BIOGEL PI INDICATOR 8 (GLOVE) ×1
GLOVE ECLIPSE 6.5 STRL STRAW (GLOVE) IMPLANT
GLOVE ECLIPSE 7.0 STRL STRAW (GLOVE) IMPLANT
GLOVE ECLIPSE 7.5 STRL STRAW (GLOVE) IMPLANT
GLOVE ECLIPSE 8.0 STRL XLNG CF (GLOVE) IMPLANT
GLOVE ECLIPSE 8.5 STRL (GLOVE) IMPLANT
GLOVE EXAM NITRILE LRG STRL (GLOVE) IMPLANT
GLOVE EXAM NITRILE MD LF STRL (GLOVE) IMPLANT
GLOVE EXAM NITRILE XL STR (GLOVE) IMPLANT
GLOVE EXAM NITRILE XS STR PU (GLOVE) IMPLANT
GLOVE INDICATOR 6.5 STRL GRN (GLOVE) IMPLANT
GLOVE INDICATOR 7.0 STRL GRN (GLOVE) IMPLANT
GLOVE INDICATOR 7.5 STRL GRN (GLOVE) IMPLANT
GLOVE INDICATOR 8.0 STRL GRN (GLOVE) IMPLANT
GLOVE INDICATOR 8.5 STRL (GLOVE) IMPLANT
GLOVE OPTIFIT SS 8.0 STRL (GLOVE) IMPLANT
GLOVE SURG SS PI 6.5 STRL IVOR (GLOVE) ×4 IMPLANT
GLOVE SURG SS PI 7.5 STRL IVOR (GLOVE) ×2 IMPLANT
GLOVE SURG SS PI 8.0 STRL IVOR (GLOVE) ×2 IMPLANT
GOWN BRE IMP SLV AUR LG STRL (GOWN DISPOSABLE) ×4 IMPLANT
GOWN BRE IMP SLV AUR XL STRL (GOWN DISPOSABLE) ×2 IMPLANT
GOWN STRL REIN 2XL LVL4 (GOWN DISPOSABLE) IMPLANT
KIT BASIN OR (CUSTOM PROCEDURE TRAY) ×2 IMPLANT
KIT ROOM TURNOVER OR (KITS) ×2 IMPLANT
NEEDLE SPNL 22GX3.5 QUINCKE BK (NEEDLE) ×4 IMPLANT
NS IRRIG 1000ML POUR BTL (IV SOLUTION) ×2 IMPLANT
PACK LAMINECTOMY NEURO (CUSTOM PROCEDURE TRAY) ×2 IMPLANT
PATTIES SURGICAL .5 X1 (DISPOSABLE) ×2 IMPLANT
PLATE ANT CERV XTEND 2 LV 32 (Plate) ×2 IMPLANT
PUTTY DBX 1CC (Putty) ×2 IMPLANT
PUTTY DBX 1CC DEPUY (Putty) ×1 IMPLANT
RUBBERBAND STERILE (MISCELLANEOUS) ×4 IMPLANT
SCREW XTD VAR 4.2 SELF TAP 12 (Screw) ×12 IMPLANT
SPACER ACDF SM LORDOTIC 7 (Spacer) ×4 IMPLANT
SPONGE GAUZE 4X4 12PLY (GAUZE/BANDAGES/DRESSINGS) ×2 IMPLANT
SPONGE INTESTINAL PEANUT (DISPOSABLE) ×2 IMPLANT
SPONGE SURGIFOAM ABS GEL SZ50 (HEMOSTASIS) ×2 IMPLANT
STRIP CLOSURE SKIN 1/2X4 (GAUZE/BANDAGES/DRESSINGS) ×2 IMPLANT
SUT VIC AB 3-0 SH 8-18 (SUTURE) ×2 IMPLANT
SYR 20ML ECCENTRIC (SYRINGE) ×2 IMPLANT
TAPE CLOTH SURG 4X10 WHT LF (GAUZE/BANDAGES/DRESSINGS) ×2 IMPLANT
TOWEL OR 17X24 6PK STRL BLUE (TOWEL DISPOSABLE) ×2 IMPLANT
TOWEL OR 17X26 10 PK STRL BLUE (TOWEL DISPOSABLE) ×2 IMPLANT
WATER STERILE IRR 1000ML POUR (IV SOLUTION) ×2 IMPLANT

## 2011-10-29 NOTE — Anesthesia Postprocedure Evaluation (Signed)
Anesthesia Post Note  Patient: Abigail [name]  Procedure(s) Performed:  ANTERIOR CERVICAL DECOMPRESSION/DISCECTOMY FUSION 2 LEVELS - Cervical five-six,Cervical six-seven nterior cervical decompression/diskectomy, fusion, plate  Anesthesia type: General  Patient location: PACU  Post pain: Pain level controlled  Post assessment: Patient's Cardiovascular Status Stable  Last Vitals:  Filed Vitals:   10/29/11 1943  BP: 137/61  Pulse: 88  Temp: 35.9 C  Resp: 13    Post vital signs: Reviewed and stable  Level of consciousness: sedated  Complications: No apparent anesthesia complications

## 2011-10-29 NOTE — Anesthesia Preprocedure Evaluation (Addendum)
Anesthesia Evaluation  Patient identified by MRN, date of birth, ID band Patient awake and Patient confused    Reviewed: Allergy & Precautions, H&P , NPO status , Patient's Chart, lab work & pertinent test results  Airway Mallampati: I TM Distance: >3 FB Neck ROM: Full    Dental  (+) Teeth Intact and Dental Advisory Given   Pulmonary    Pulmonary exam normal       Cardiovascular hypertension, Pt. on medications     Neuro/Psych    GI/Hepatic GERD-  ,  Endo/Other    Renal/GU      Musculoskeletal   Abdominal   Peds  Hematology   Anesthesia Other Findings   Reproductive/Obstetrics                          Anesthesia Physical Anesthesia Plan  ASA: III  Anesthesia Plan: General   Post-op Pain Management:    Induction: Intravenous  Airway Management Planned: Oral ETT  Additional Equipment:   Intra-op Plan:   Post-operative Plan: Extubation in OR  Informed Consent: I have reviewed the patients History and Physical, chart, labs and discussed the procedure including the risks, benefits and alternatives for the proposed anesthesia with the patient or authorized representative who has indicated his/her understanding and acceptance.   Dental advisory given  Plan Discussed with:   Anesthesia Plan Comments:         Anesthesia Quick Evaluation

## 2011-10-29 NOTE — H&P (Signed)
LERA GAINES is an 50 y.o. female.   Chief Complaint: neck and left arm pain. HPI: several weeks of neck and left arm pain associated with weakness.Had the episode several months ago BUT GOING TO THE RIGHT ARM .Conservative treatment of no help. Mri showed 2 level cervical disease.  Past Medical History  Diagnosis Date  . Hypertension   . Asthma   . Lupus   . Headache     migraines  . GERD (gastroesophageal reflux disease)   . Cancer     hx melanoma    Past Surgical History  Procedure Date  . Melanoma excision   . Tmj arthroplasty 1970  . Appendectomy   . Cholecystectomy   . Pancreas surgery     sphincterotomy  . Abdominoplasty   . Liver biopsy   . Breast surgery   . Abdominal hysterectomy     History reviewed. No pertinent family history. Social History:  reports that she has never smoked. She does not have any smokeless tobacco history on file. She reports that she drinks alcohol. She reports that she does not use illicit drugs.  Allergies:  Allergies  Allergen Reactions  . Doxycycline Hives  . Latex Other (See Comments)    Breathing problems.  . Phenergan Other (See Comments)    "uncontrolled vomiting"  . Sulfonamide Derivatives Hives  . Meperidine Hcl Palpitations    Medications Prior to Admission  Medication Dose Route Frequency Provider Last Rate Last Dose  . ceFAZolin (ANCEF) IVPB 2 g/50 mL premix  2 g Intravenous 60 min Pre-Op Crystal Salomon Fick, PHARMD       No current outpatient prescriptions on file as of 10/29/2011.    No results found for this or any previous visit (from the past 48 hour(s)). No results found.  Review of Systems  Constitutional: Negative.   HENT: Positive for neck pain.   Eyes: Negative.   Respiratory: Negative.   Cardiovascular: Negative.   Gastrointestinal: Negative.   Genitourinary: Negative.   Skin: Negative.   Neurological: Positive for sensory change and focal weakness.  Endo/Heme/Allergies: Negative.     Psychiatric/Behavioral: Negative.     There were no vitals taken for this visit. Physical Examhent  Nl. Neck pain and decrease flexibility. cv nl. Lungs nl.  cv nl.  Abdomen soft. Extremities nl.. NEURO NORMAL STRENGHT. SENSORY BURNING SENSATION TO HANDS. DTR NL BUT DECREASE OF TRICEPS.   Assessment/Plan MRI  CERVICAL 5 6 HNP GOING TO THE RIGHT. CERVICAL 67 ,HNP GOING TO THE LEFT to be admited for decompression and fusion at 56  And 67 with plate and cages.  Aveer Bartow M 10/29/2011, 1:57 PM

## 2011-10-29 NOTE — Preoperative (Signed)
Beta Blockers   Reason not to administer Beta Blockers:Not Applicable 

## 2011-10-29 NOTE — Brief Op Note (Signed)
10/29/2011  7:35 PM  PATIENT:  Abigail [name]  50 y.o. 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  TOURNIQUET:  * No tourniquets in log *  DICTATION: .Other Dictation: Dictation Number [id number]  PLAN OF CARE: Admit to inpatient   PATIENT DISPOSITION:  PACU - hemodynamically stable.   Delay start of Pharmacological VTE agent (>24hrs) due to surgical blood loss or risk of bleeding:  {YES/NO/NOT APPLICABLE:20182

## 2011-10-29 NOTE — Transfer of Care (Signed)
Immediate Anesthesia Transfer of Care Note  Patient: Abigail [name]  Procedure(s) Performed:  ANTERIOR CERVICAL DECOMPRESSION/DISCECTOMY FUSION 2 LEVELS - Cervical five-six,Cervical six-seven nterior cervical decompression/diskectomy, fusion, plate  Patient Location: PACU  Anesthesia Type: General  Level of Consciousness: awake, alert  and oriented  Airway & Oxygen Therapy: Patient connected to face mask oxygen  Post-op Assessment: Report given to PACU RN, Post -op Vital signs reviewed and stable and Patient moving all extremities X 4  Post vital signs: Reviewed and stable  Complications: No apparent anesthesia complications

## 2011-10-30 ENCOUNTER — Encounter (HOSPITAL_COMMUNITY): Payer: Self-pay | Admitting: Neurosurgery

## 2011-10-30 NOTE — Discharge Summary (Signed)
   Admission dx and final dx  :c56 67 hnp. Diet regular. Activity no driving for 1 week. Medications oxycodone , diazepam. Wound leave it open . FOLLOW UP 3 WEEKS OR BEFORE IN [facility] OFFICE

## 2011-10-30 NOTE — Op Note (Signed)
[name], [name] NO.:  [id number]  MEDICAL RECORD NO.:  [id number]  LOCATION:  3523                         FACILITY:  [facility]  PHYSICIAN:  Hilda Lias, M.D.   DATE OF BIRTH:  [birth year]  DATE OF PROCEDURE:  10/29/2011 DATE OF DISCHARGE:  10/30/2011                              OPERATIVE REPORT   PREOPERATIVE DIAGNOSIS:  C5-C6 herniated disk with right radiculopathy, C6-7 herniated disk with left radiculopathy.  POSTOPERATIVE DIAGNOSIS:  C5-C6 herniated disk with right radiculopathy, C6-7 herniated disk with left radiculopathy.  PROCEDURE:  Anterior C5-6, C6-7 diskectomy, decompression of the spinal cord, removal of free fragment affecting the right C6 nerve root and also the left C7.  Interbody fusion with cages, plate, microscope.  SURGEON:  Hilda Lias, MD  ASSISTANT:  Hewitt Shorts, MD  CLINICAL HISTORY:  The patient was seen by me in my office several days ago because of acute onset of pain going to the left upper extremity for at least 4 weeks.  Back in May, she had the same problem but mostly going to the right upper extremity.  She had been taking high amounts of pain medication and cortisone without improvement.  MRI showed herniated disk on the right side at the level of C5-6 and to the left at the level of C6-7.  Because of the continuous pain, she decided for surgery.  The surgery was fully explained to her and her husband as well as his brother-in-law who is a physician.  DESCRIPTION OF PROCEDURE:  The patient was taken to the OR, and after intubation, the left side of the neck was cleaned with DuraPrep. Transverse incision was made through the skin, subcutaneous tissue, platysma, straight down to the cervical spine.  Because of the size, we used 2 needles, the upper one was at the level of 4-5.  Then, we brought the microscope into the area and we opened the anterior ligament at the level of 5-6, 6-7.  Total diskectomy was  achieved.  At the level 5-6 mostly going to the right side and adherent to the C6 nerve root, there was a large fragment which partially was calcified.  Decompression of the spinal cord at both C6 nerve root was achieved.  At the level of C6- 7, we found like free fragment going to the left side.  Decompression was achieved.  The endplate was drilled.  Then, 2 cages, lordotic, 7-mm height, with DBX and autograft was inserted at those 2 levels.  Then, a plate using 6 screws was put in place.  Lateral cervical spine showed good position of the graft and the plate at the level of 5-6.  Because of the shoulder, we were unable to see the C6-7. Hemostasis was accomplished with bipolar.  We waited 10 minutes just to be sure that we had good hemostasis.  Once this was proved, the wound was closed with Vicryl and Steri-Strips.          ______________________________ Hilda Lias, M.D.     EB/MEDQ  D:  10/29/2011  T:  10/30/2011  Job:  [id number]

## 2011-10-30 NOTE — Progress Notes (Signed)
10/30/11 8:56 PT Note: PT order received.  Noted PT discontinued.  No evaluation complete at this time.  Please reorder PT if needed.  Thanks.  10/30/2011 Cephus Shelling, PT, DPT [phone]

## 2011-10-30 NOTE — Progress Notes (Signed)
Subjective: Patient reports no pain no weakness  Objective: Vital signs in last 24 hours: Temp:  [96.7 F (35.9 C)-98.1 F (36.7 C)] 97.7 F (36.5 C) (12/19 0800) Pulse Rate:  [56-96] 87  (12/19 0800) Resp:  [11-35] 16  (12/19 0800) BP: (110-140)/(61-89) 110/77 mmHg (12/19 0800) SpO2:  [92 %-99 %] 92 % (12/19 0800) Weight:  [91.627 kg (202 lb)] 202 lb (91.627 kg) (12/18 1537)  Intake/Output from previous day: 12/18 0701 - 12/19 0700 In: 1500 [I.V.:1500] Out: 250 [Blood:250] Intake/Output this shift: Total I/O In: 480 [P.O.:480] Out: -   normal  Lab Results:  Basename 10/29/11 1500  WBC 8.2  HGB 14.2  HCT 41.8  PLT 226   BMET  Basename 10/29/11 1500  NA 140  K 4.2  CL 103  CO2 30  GLUCOSE 89  BUN 15  CREATININE 0.79  CALCIUM 9.2    Studies/Results: Dg Cervical Spine 2-3 Views  10/29/2011  *RADIOLOGY REPORT*  Clinical Data: ACDF C5-6 and C6-7  CERVICAL SPINE - 2-3 VIEW  Comparison: None.  Findings: Initial intraoperative radiograph demonstrate surgical probes at C4-5 and C5-6.  Second intraoperative radiograph demonstrates anterior cervical spine fusion hardware at C5-6 and C6-7, incompletely visualized.  IMPRESSION: Intraoperative radiographs during ACDF at C5-6 and C6-7, as described above.  Original Report Authenticated By: Charline Bills, M.D.    Assessment/Plan: Discharge today  LOS: 1 day   To see me in 3 weeks   Jovanny Stephanie M 10/30/2011, 9:38 AM

## 2011-11-07 ENCOUNTER — Ambulatory Visit (HOSPITAL_COMMUNITY): Payer: Medicare Other

## 2011-12-01 ENCOUNTER — Ambulatory Visit (INDEPENDENT_AMBULATORY_CARE_PROVIDER_SITE_OTHER): Payer: Medicare Other

## 2011-12-01 DIAGNOSIS — M542 Cervicalgia: Secondary | ICD-10-CM

## 2011-12-24 ENCOUNTER — Telehealth: Payer: Self-pay

## 2011-12-24 NOTE — Telephone Encounter (Signed)
Patient would like a refill on adderall.  Please call and let her know when she can come and pick it up.  Would like to have 10mg  instead of 5 mg tabs.   Dr Merla Riches usually prescribes.

## 2011-12-24 NOTE — Telephone Encounter (Signed)
Dr.Doolittle,   Lorain Childes is strength increase ok?  Yanil Dawe

## 2011-12-24 NOTE — Telephone Encounter (Signed)
Chart pulled to clinical TL station

## 2011-12-25 NOTE — Telephone Encounter (Signed)
i need to review.  I'm unclear that I provide this Chart to my box

## 2011-12-25 NOTE — Telephone Encounter (Signed)
Dr Merla Riches, I have asked [facility] to look for pts chart and put it in your box.

## 2011-12-30 NOTE — Telephone Encounter (Signed)
rx written 12/29/11 one month

## 2011-12-30 NOTE — Telephone Encounter (Signed)
LMOM at pts extension at 104 that Rx is ready.

## 2011-12-31 ENCOUNTER — Ambulatory Visit: Payer: Medicare Other | Attending: Neurosurgery | Admitting: Physical Therapy

## 2011-12-31 DIAGNOSIS — M542 Cervicalgia: Secondary | ICD-10-CM | POA: Insufficient documentation

## 2011-12-31 DIAGNOSIS — R5381 Other malaise: Secondary | ICD-10-CM | POA: Insufficient documentation

## 2011-12-31 DIAGNOSIS — M2569 Stiffness of other specified joint, not elsewhere classified: Secondary | ICD-10-CM | POA: Insufficient documentation

## 2011-12-31 DIAGNOSIS — IMO0001 Reserved for inherently not codable concepts without codable children: Secondary | ICD-10-CM | POA: Insufficient documentation

## 2012-01-10 ENCOUNTER — Ambulatory Visit (INDEPENDENT_AMBULATORY_CARE_PROVIDER_SITE_OTHER): Payer: Medicare Other | Admitting: Physician Assistant

## 2012-01-10 VITALS — BP 135/93 | HR 88 | Temp 98.5°F | Resp 18

## 2012-01-10 DIAGNOSIS — R0609 Other forms of dyspnea: Secondary | ICD-10-CM

## 2012-01-10 DIAGNOSIS — R05 Cough: Secondary | ICD-10-CM

## 2012-01-10 DIAGNOSIS — R0989 Other specified symptoms and signs involving the circulatory and respiratory systems: Secondary | ICD-10-CM

## 2012-01-10 DIAGNOSIS — R062 Wheezing: Secondary | ICD-10-CM

## 2012-01-10 DIAGNOSIS — R059 Cough, unspecified: Secondary | ICD-10-CM

## 2012-01-10 MED ORDER — ALBUTEROL SULFATE HFA 108 (90 BASE) MCG/ACT IN AERS: 2.0000 | INHALATION_SPRAY | Freq: Four times a day (QID) | RESPIRATORY_TRACT | Status: DC | PRN

## 2012-01-10 MED ORDER — ALBUTEROL SULFATE (2.5 MG/3ML) 0.083% IN NEBU
2.5000 mg | INHALATION_SOLUTION | Freq: Once | RESPIRATORY_TRACT | Status: AC
  Administered 2012-01-10: 2.5 mg via RESPIRATORY_TRACT

## 2012-01-10 MED ORDER — AZELASTINE HCL 0.1 % NA SOLN: 2.0000 | Freq: Every day | NASAL | Status: DC | PRN

## 2012-01-10 MED ORDER — IPRATROPIUM BROMIDE 0.02 % IN SOLN
0.5000 mg | Freq: Once | RESPIRATORY_TRACT | Status: AC
  Administered 2012-01-10: 0.5 mg via RESPIRATORY_TRACT

## 2012-01-10 NOTE — Patient Instructions (Signed)
Fannie Knee the nasal spray and inhaler as needed.

## 2012-01-10 NOTE — Progress Notes (Signed)
  Subjective:    Patient ID: Abigail [name], female    DOB: [birth year], 51 y.o.   MRN: [id number]  HPI The patient presents after an asthma exacerbation. She came to work and started coughing. No specific trigger identified. When she started coughing she couldn't stop-couldn't catch her breath and was wheezing. Has run out of the albuterol rescue inhaler she typically kept on her desk. Both she and her husband have a history of asthma. No recent URI type symptoms. Recent neck surgery. She had a steroid injection of the shoulder yesterday.  She reports that she feels well now-no wheezing no shortness of breath. She continues to cough.  Medications and allergies are reviewed. Medical history is reviewed.  Review of Systems No chest pain, shortness of breath, blurred vision, dizziness, paresthesias. No wheezing.     Objective:   Physical Exam Vital signs are noted. This is a well-developed, well-nourished white female who is awake, alert and oriented in no acute distress. She coughs periodically throughout the interview and exam. Respirations are nonlabored. No retractions or nasal flaring. Nasal mucosa is pink and moist without congestion. External auditory canals and TMs are clear. Oropharynx is clear. Neck is supple and nontender without lymphadenopathy or thyromegaly. Heart has a regular rate and rhythm and lungs are clear bilaterally without wheezes. Skin is warm and dry.  Significant subjective improvement after neb.  Lungs remained clear.     Assessment & Plan:  Cough, wheezing.  Albuterol + Atrovent neb administered. Refill Albuterol inhaler and Astelin NS.

## 2012-01-18 ENCOUNTER — Encounter: Payer: Self-pay | Admitting: Family Medicine

## 2012-01-20 ENCOUNTER — Encounter: Payer: Self-pay | Admitting: Family Medicine

## 2012-01-20 ENCOUNTER — Encounter (HOSPITAL_COMMUNITY): Payer: Self-pay | Admitting: General Practice

## 2012-01-20 ENCOUNTER — Telehealth: Payer: Self-pay | Admitting: Internal Medicine

## 2012-01-20 ENCOUNTER — Inpatient Hospital Stay (HOSPITAL_COMMUNITY)
Admission: AD | Admit: 2012-01-20 | Discharge: 2012-01-24 | DRG: 392 | Disposition: A | Discharge status: Home / Self Care | Payer: Medicare Other | Source: Ambulatory Visit | Attending: Family Medicine | Admitting: Family Medicine

## 2012-01-20 ENCOUNTER — Ambulatory Visit (INDEPENDENT_AMBULATORY_CARE_PROVIDER_SITE_OTHER): Payer: Medicare Other | Admitting: Family Medicine

## 2012-01-20 ENCOUNTER — Ambulatory Visit (HOSPITAL_COMMUNITY)
Admission: RE | Admit: 2012-01-20 | Discharge: 2012-01-20 | Disposition: A | Discharge status: Home / Self Care | Payer: Medicare Other | Source: Ambulatory Visit | Attending: Family Medicine | Admitting: Family Medicine

## 2012-01-20 DIAGNOSIS — K219 Gastro-esophageal reflux disease without esophagitis: Secondary | ICD-10-CM | POA: Diagnosis present

## 2012-01-20 DIAGNOSIS — IMO0001 Reserved for inherently not codable concepts without codable children: Secondary | ICD-10-CM | POA: Diagnosis present

## 2012-01-20 DIAGNOSIS — Z91040 Latex allergy status: Secondary | ICD-10-CM

## 2012-01-20 DIAGNOSIS — K5720 Diverticulitis of large intestine with perforation and abscess without bleeding: Secondary | ICD-10-CM

## 2012-01-20 DIAGNOSIS — M3500 Sicca syndrome, unspecified: Secondary | ICD-10-CM | POA: Diagnosis present

## 2012-01-20 DIAGNOSIS — K5732 Diverticulitis of large intestine without perforation or abscess without bleeding: Principal | ICD-10-CM | POA: Diagnosis present

## 2012-01-20 DIAGNOSIS — R109 Unspecified abdominal pain: Secondary | ICD-10-CM

## 2012-01-20 DIAGNOSIS — Z881 Allergy status to other antibiotic agents status: Secondary | ICD-10-CM

## 2012-01-20 DIAGNOSIS — Z79899 Other long term (current) drug therapy: Secondary | ICD-10-CM

## 2012-01-20 DIAGNOSIS — I10 Essential (primary) hypertension: Secondary | ICD-10-CM | POA: Diagnosis present

## 2012-01-20 DIAGNOSIS — R1032 Left lower quadrant pain: Secondary | ICD-10-CM | POA: Insufficient documentation

## 2012-01-20 DIAGNOSIS — Z9089 Acquired absence of other organs: Secondary | ICD-10-CM | POA: Insufficient documentation

## 2012-01-20 DIAGNOSIS — F3289 Other specified depressive episodes: Secondary | ICD-10-CM | POA: Diagnosis present

## 2012-01-20 DIAGNOSIS — Z882 Allergy status to sulfonamides status: Secondary | ICD-10-CM

## 2012-01-20 DIAGNOSIS — J683 Other acute and subacute respiratory conditions due to chemicals, gases, fumes and vapors: Secondary | ICD-10-CM

## 2012-01-20 DIAGNOSIS — F329 Major depressive disorder, single episode, unspecified: Secondary | ICD-10-CM | POA: Diagnosis present

## 2012-01-20 DIAGNOSIS — K7689 Other specified diseases of liver: Secondary | ICD-10-CM | POA: Insufficient documentation

## 2012-01-20 DIAGNOSIS — M329 Systemic lupus erythematosus, unspecified: Secondary | ICD-10-CM | POA: Diagnosis present

## 2012-01-20 DIAGNOSIS — J45909 Unspecified asthma, uncomplicated: Secondary | ICD-10-CM | POA: Diagnosis present

## 2012-01-20 DIAGNOSIS — Z888 Allergy status to other drugs, medicaments and biological substances status: Secondary | ICD-10-CM

## 2012-01-20 DIAGNOSIS — Z981 Arthrodesis status: Secondary | ICD-10-CM

## 2012-01-20 HISTORY — DX: Sjogren syndrome, unspecified: M35.00

## 2012-01-20 HISTORY — DX: Fibromyalgia: M79.7

## 2012-01-20 HISTORY — DX: Arteritis, unspecified: I77.6

## 2012-01-20 LAB — POCT CBC
Lymph, poc: 1.6 (ref 0.6–3.4)
MCH, POC: 27.4 pg (ref 27–31.2)
MCHC: 32.5 g/dL (ref 31.8–35.4)
MID (cbc): 0.5 (ref 0–0.9)
MPV: 11 fL (ref 0–99.8)
POC Granulocyte: 4.6 (ref 2–6.9)
POC LYMPH PERCENT: 24 %L (ref 10–50)
POC MID %: 7.8 %M (ref 0–12)
Platelet Count, POC: 234 10*3/uL (ref 142–424)
RDW, POC: 14.2 %

## 2012-01-20 LAB — BASIC METABOLIC PANEL
Calcium: 8.8 mg/dL (ref 8.4–10.5)
Creat: 0.71 mg/dL (ref 0.50–1.10)
Sodium: 139 mEq/L (ref 135–145)

## 2012-01-20 LAB — LIPASE: Lipase: 81 U/L — ABNORMAL HIGH (ref 0–75)

## 2012-01-20 MED ORDER — METRONIDAZOLE IN NACL 5-0.79 MG/ML-% IV SOLN
500.0000 mg | Freq: Three times a day (TID) | INTRAVENOUS | Status: DC
  Filled 2012-01-20 (×2): qty 100

## 2012-01-20 MED ORDER — PIPERACILLIN-TAZOBACTAM 3.375 G IVPB
3.3750 g | Freq: Three times a day (TID) | INTRAVENOUS | Status: DC
  Administered 2012-01-21 – 2012-01-23 (×8): 3.375 g via INTRAVENOUS
  Filled 2012-01-20 (×9): qty 50

## 2012-01-20 MED ORDER — IOHEXOL 300 MG/ML  SOLN
100.0000 mL | Freq: Once | INTRAMUSCULAR | Status: AC | PRN
  Administered 2012-01-20: 100 mL via INTRAVENOUS

## 2012-01-20 MED ORDER — PANTOPRAZOLE SODIUM 40 MG IV SOLR
40.0000 mg | INTRAVENOUS | Status: DC
  Administered 2012-01-20 – 2012-01-21 (×2): 40 mg via INTRAVENOUS
  Filled 2012-01-20 (×3): qty 40

## 2012-01-20 MED ORDER — SODIUM CHLORIDE 0.9 % IV SOLN
2.0000 g | Freq: Four times a day (QID) | INTRAVENOUS | Status: DC
  Filled 2012-01-20 (×3): qty 2000

## 2012-01-20 MED ORDER — HEPARIN SODIUM (PORCINE) 5000 UNIT/ML IJ SOLN
5000.0000 [IU] | Freq: Three times a day (TID) | INTRAMUSCULAR | Status: DC
  Administered 2012-01-20 – 2012-01-24 (×11): 5000 [IU] via SUBCUTANEOUS
  Filled 2012-01-20 (×14): qty 1

## 2012-01-20 MED ORDER — LOSARTAN POTASSIUM 50 MG PO TABS
100.0000 mg | ORAL_TABLET | Freq: Every day | ORAL | Status: DC
  Administered 2012-01-21 – 2012-01-24 (×4): 100 mg via ORAL
  Filled 2012-01-20 (×4): qty 2

## 2012-01-20 MED ORDER — ALBUTEROL SULFATE HFA 108 (90 BASE) MCG/ACT IN AERS
2.0000 | INHALATION_SPRAY | Freq: Four times a day (QID) | RESPIRATORY_TRACT | Status: DC | PRN
  Filled 2012-01-20: qty 6.7

## 2012-01-20 MED ORDER — AZELASTINE HCL 0.1 % NA SOLN
2.0000 | Freq: Every day | NASAL | Status: DC | PRN
  Filled 2012-01-20: qty 30

## 2012-01-20 MED ORDER — SODIUM CHLORIDE 0.9 % IV SOLN
INTRAVENOUS | Status: DC
  Administered 2012-01-20 – 2012-01-23 (×7): via INTRAVENOUS

## 2012-01-20 MED ORDER — MORPHINE SULFATE 2 MG/ML IJ SOLN
2.0000 mg | INTRAMUSCULAR | Status: DC | PRN
  Administered 2012-01-20 – 2012-01-23 (×15): 2 mg via INTRAVENOUS
  Filled 2012-01-20 (×15): qty 1

## 2012-01-20 MED ORDER — AZATHIOPRINE 50 MG PO TABS
100.0000 mg | ORAL_TABLET | Freq: Every day | ORAL | Status: DC
  Administered 2012-01-21: 100 mg via ORAL
  Filled 2012-01-20: qty 2

## 2012-01-20 MED ORDER — LORAZEPAM 2 MG/ML IJ SOLN
1.0000 mg | Freq: Four times a day (QID) | INTRAMUSCULAR | Status: DC | PRN
  Administered 2012-01-20 – 2012-01-23 (×6): 1 mg via INTRAVENOUS
  Filled 2012-01-20 (×7): qty 1

## 2012-01-20 MED ORDER — GENTAMICIN SULFATE 40 MG/ML IJ SOLN
500.0000 mg | INTRAVENOUS | Status: DC
  Administered 2012-01-21 – 2012-01-22 (×3): 500 mg via INTRAVENOUS
  Filled 2012-01-20 (×3): qty 12.5

## 2012-01-20 MED ORDER — ONDANSETRON HCL 4 MG/2ML IJ SOLN
4.0000 mg | Freq: Four times a day (QID) | INTRAMUSCULAR | Status: DC | PRN
  Administered 2012-01-21 (×2): 4 mg via INTRAVENOUS
  Filled 2012-01-20 (×2): qty 2

## 2012-01-20 MED ORDER — SODIUM CHLORIDE 0.9 % IV BOLUS (SEPSIS)
1000.0000 mL | Freq: Once | INTRAVENOUS | Status: AC
  Administered 2012-01-21: 1000 mL via INTRAVENOUS

## 2012-01-20 MED ORDER — DIVALPROEX SODIUM 500 MG PO DR TAB
500.0000 mg | DELAYED_RELEASE_TABLET | Freq: Every day | ORAL | Status: DC
  Administered 2012-01-21 – 2012-01-24 (×4): 500 mg via ORAL
  Filled 2012-01-20 (×4): qty 1

## 2012-01-20 NOTE — Telephone Encounter (Signed)
Keep appointment with PCP today

## 2012-01-20 NOTE — Progress Notes (Signed)
Subjective:    Patient ID: Abigail [name], female    DOB: [birth year], 51 y.o.   MRN: [id number]  HPI  Patient presents for routine follow up.  Abdominal pain- patient complains of significant constipation. Over the last 2 days used Miralax and an enema without results. Last night developed with fever, chills, nausea and left lower abdominal pain. Pain worse with movement.  History of diverticulitis with five prior flairs requiring hospitalization. Last flair 2010.  Colonoscopy 2009.  Patient states with diverticular flairs develops significant hypokalemia(?related to diarrhea)  BP readings had been elevated and patient increased her Cozaar to 100 mg. Per patient BP readings are trending down on current dose of antihypertensive..  Asthma- uses rescue inhaler "prn"; two weeks ago required an albuterol treatment here at the office. No issues since. States has never required a maintenance inhaler  Sjorgren's- recent follow up with Rheumatology at Dr. Satira Sark. Sherron Monday [facility].  Imuran continued.  ESR has normalized to 26.   Review of Systems  Constitutional: Positive for fever, chills, appetite change and fatigue.  HENT: Positive for rhinorrhea.   Gastrointestinal: Positive for nausea and constipation. Negative for vomiting.       Objective:   Physical Exam  Constitutional: She appears well-developed and well-nourished.  HENT:  Head: Normocephalic and atraumatic.  Mouth/Throat: Oropharynx is clear and moist.  Neck: Neck supple.  Cardiovascular: Normal rate, regular rhythm and normal heart sounds.   Pulmonary/Chest: Effort normal and breath sounds normal.  Abdominal: Normal appearance. She exhibits no distension and no mass. Bowel sounds are increased. There is no hepatosplenomegaly. There is tenderness (left lower quadrant) in the left lower quadrant. There is guarding. There is no rigidity and no rebound.  Neurological: She is alert.  Skin: Skin is warm.  Psychiatric: She has a normal mood and  affect.     Results for orders placed in visit on 01/20/12  POCT CBC      Component Value Range   WBC 6.8  4.6 - 10.2 (K/uL)   Lymph, poc 1.6  0.6 - 3.4    POC LYMPH PERCENT 24.0  10 - 50 (%L)   MID (cbc) 0.5  0 - 0.9    POC MID % 7.8  0 - 12 (%M)   POC Granulocyte 4.6  2 - 6.9    Granulocyte percent 68.2  37 - 80 (%G)   RBC 4.70  4.04 - 5.48 (M/uL)   Hemoglobin 12.9  12.2 - 16.2 (g/dL)   HCT, POC 28.4  13.2 - 47.9 (%)   MCV 84.4  80 - 97 (fL)   MCH, POC 27.4  27 - 31.2 (pg)   MCHC 32.5  31.8 - 35.4 (g/dL)   RDW, POC 44.0     Platelet Count, POC 234  142 - 424 (K/uL)   MPV 11.0  0 - 99.8 (fL)  BASIC METABOLIC PANEL      Component Value Range   Sodium 139  135 - 145 (mEq/L)   Potassium 3.9  3.5 - 5.3 (mEq/L)   Chloride 105  96 - 112 (mEq/L)   CO2 23  19 - 32 (mEq/L)   Glucose, Bld 100 (*) 70 - 99 (mg/dL)   BUN 9  6 - 23 (mg/dL)   Creat 1.02  7.25 - 3.66 (mg/dL)   Calcium 8.8  8.4 - 44.0 (mg/dL)  LIPASE      Component Value Range   Lipase 81 (*) 0 - 75 (U/L)  Assessment & Plan:   1. Acute diverticulitis  POCT CBC, Basic metabolic panel, Lipase, CT Abdomen Pelvis W Contrast today  2. SJOGREN'S SYNDROME([facility]) Imuran  3. HTN Continue on Cozaar 100 mg QD  4. GERD    5. CHOLECYSTECTOMY, HX OF    6. Reactive airways       Received call report from Dr. Kathe Mariner CT demonstrates sigmoid diverticulitis and extraluminal gas adjacent to the sigmoid colon. Spoke to patient who agrees to return to [facility] for admission, IV antibiotics and surgical consult. Patient is followed by [facility] agrees to accept this patient in transfer.

## 2012-01-20 NOTE — Telephone Encounter (Signed)
Patient calling to report LLQ pain that started over the weekend. States she was having difficulty having a bowel movement and she took Miralax x 2 days without results. She used and enema and had a small amount of results. Last night, the abdominal pain increased and she started running a fever. Also, c/o nausea. She has an appointment this afternoon with her PCP but wanted to know what she should do. Hx diverticulosis, diverticulitis, Sjogren's syndrome. Allergy- Doxycline, Sulfa, and tetracycline. Dr. Juanda Chance off. Dr. Leone Payor- DOD (no extender appt. Available) Please, advise.

## 2012-01-20 NOTE — H&P (Signed)
Abigail [name] is an 51 y.o. female.   HPI: This is a 51 year old with a history of GERD and several episodes of diverticulitis on immunosuppressants for Sjogren's syndrome/lupus presenting with several days of abdominal pain. The patient reports being very vigilant about her bowel habits and her diet, however, she had one "bad day" when a relative/friend visited her. Since then, she has felt constipated and started experiencing left lower quadrant abdominal pain. She tried taking Miralax and was able to have small bowel movements, however, the pain worsened. She presented to [facility] [address] and was seen by Dr. Hal Hope who ordered a CT-abdomen, which was significant for acute sigmoid diverticulitis with extraluminal bowel gas consistent with microperforation. The patient was directly admitted to the [facility] at [facility].  On the floor, the patient reports significant left lower quadrant abdominal pain. She also reports nausea with dry-heaving and now vomiting that has started within the past few hours. The patient also endorses fevers to 102 at home over the past few days.    Past Medical History  Diagnosis Date  . Hypertension   . Asthma   . Lupus   . Headache     migraines  . GERD (gastroesophageal reflux disease)   . Cancer     hx melanoma  . Complication of anesthesia     Sjogrens Syndrome  . Sjogren's syndrome   . Shortness of breath     "related to the asthma"  . Vasculitis     "autoimmune"  . Fibromyalgia     Past Surgical History  Procedure Date  . Melanoma excision   . Tmj arthroplasty 1970  . Appendectomy   . Cholecystectomy   . Pancreas surgery     sphincterotomy  . Abdominoplasty   . Liver biopsy   . Breast surgery   . Anterior cervical decomp/discectomy fusion 10/29/2011    Procedure: ANTERIOR CERVICAL DECOMPRESSION/DISCECTOMY FUSION 2 LEVELS;  Surgeon: Karn Cassis;  Location: [facility];  Service: Neurosurgery;   Laterality: N/A;  Cervical five-six,Cervical six-seven nterior cervical decompression/diskectomy, fusion, plate  . Abdominal hysterectomy     Partial    History reviewed. No pertinent family history. Social History:  reports that she has never smoked. She has never used smokeless tobacco. She reports that she drinks alcohol. She reports that she does not use illicit drugs.  Allergies:  Allergies  Allergen Reactions  . Meperidine Hcl Other (See Comments)    tachycardia  . Doxycycline Hives  . Latex Other (See Comments)    Breathing problems.  . Phenergan Other (See Comments)    "uncontrolled vomiting"  . Sulfonamide Derivatives Hives  . Tetracyclines & Related Rash    Medications Prior to Admission  Medication Dose Route Frequency Provider Last Rate Last Dose  . 0.9 %  sodium chloride infusion   Intravenous Continuous Elsie [place], MD      . albuterol (PROVENTIL HFA;VENTOLIN HFA) 108 (90 BASE) MCG/ACT inhaler 2 puff  2 puff Inhalation Q6H PRN Elsie Sa[place], MD      . ampicillin (OMNIPEN) 2 g in sodium chloride 0.9 % 50 mL IVPB  2 g Intravenous Q6H Elsie [place], MD      . azaTHIOprine [facility]) tablet 100 mg  100 mg Oral Daily Elsie Saas [place], MD      . azelastine (ASTELIN) nasal spray 2 spray  2 spray Each Nare Daily PRN Shelly Rubenstein, MD      .  divalproex (DEPAKOTE) DR tablet 500 mg  500 mg Oral Daily Elsie [place], MD      . heparin injection 5,000 Units  5,000 Units Subcutaneous Q8H Elsie [place], MD      . iohexol (OMNIPAQUE) 300 MG/ML solution 100 mL  100 mL Intravenous Once PRN Medication Radiologist, MD   100 mL at 01/20/12 1936  . LORazepam (ATIVAN) injection 1 mg  1 mg Intravenous Q6H PRN Elsie [place]as [place], MD      . losartan (COZAAR) tablet 100 mg  100 mg Oral Daily Elsie [place]as [place], MD      . metroNIDAZOLE (FLAGYL) IVPB 500 mg  500 mg Intravenous Q8H Elsie [place]as [place], MD      . morphine 2 MG/ML injection 2 mg  2 mg Intravenous Q2H PRN Elsie Saas [place],  MD      . ondansetron [facility]) injection 4 mg  4 mg Intravenous Q6H PRN Elsie [place], MD      . pantoprazole (PROTONIX) injection 40 mg  40 mg Intravenous Q24H Elsie [place]as [place], MD      . sodium chloride 0.9 % bolus 1,000 mL  1,000 mL Intravenous Once Shelly Rubenstein, MD       Medications Prior to Admission  Medication Sig Dispense Refill  . albuterol (PROVENTIL HFA;VENTOLIN HFA) 108 (90 BASE) MCG/ACT inhaler Inhale 2 puffs into the lungs every 6 (six) hours as needed. For shortness of breath.  1 Inhaler  2  . azaTHIOprine (IMURAN) 50 MG tablet Take 100 mg by mouth daily.        Marland Kitchen azelastine (ASTELIN) 137 MCG/SPRAY nasal spray Place 2 sprays into the nose daily as needed. For stuffy nose.      . divalproex (DEPAKOTE) 500 MG DR tablet Take 500 mg by mouth daily.        Marland Kitchen losartan (COZAAR) 50 MG tablet Take 100 mg by mouth daily.       . ranitidine (ZANTAC) 75 MG tablet Take 75 mg by mouth daily.          Results for orders placed in visit on 01/20/12 (from the past 48 hour(s))  BASIC METABOLIC PANEL     Status: Abnormal   Collection Time   01/20/12  3:32 PM      Component Value Range Comment   Sodium 139  135 - 145 (mEq/L)    Potassium 3.9  3.5 - 5.3 (mEq/L)    Chloride 105  96 - 112 (mEq/L)    CO2 23  19 - 32 (mEq/L)    Glucose, Bld 100 (*) 70 - 99 (mg/dL)    BUN 9  6 - 23 (mg/dL)    Creat 4.54  0.98 - 1.10 (mg/dL)    Calcium 8.8  8.4 - 10.5 (mg/dL)   LIPASE     Status: Abnormal   Collection Time   01/20/12  3:32 PM      Component Value Range Comment   Lipase 81 (*) 0 - 75 (U/L)   POCT CBC     Status: Normal   Collection Time   01/20/12  3:42 PM      Component Value Range Comment   WBC 6.8  4.6 - 10.2 (K/uL)    Lymph, poc 1.6  0.6 - 3.4     POC LYMPH PERCENT 24.0  10 - 50 (%L)    MID (cbc) 0.5  0 - 0.9     POC  MID % 7.8  0 - 12 (%M)    POC Granulocyte 4.6  2 - 6.9     Granulocyte percent 68.2  37 - 80 (%G)    RBC 4.70  4.04 - 5.48 (M/uL)    Hemoglobin 12.9  12.2 - 16.2  (g/dL)    HCT, POC 16.1  09.6 - 47.9 (%)    MCV 84.4  80 - 97 (fL)    MCH, POC 27.4  27 - 31.2 (pg)    MCHC 32.5  31.8 - 35.4 (g/dL)    RDW, POC 04.5      Platelet Count, POC 234  142 - 424 (K/uL)    MPV 11.0  0 - 99.8 (fL)    Ct Abdomen Pelvis W Contrast  01/20/2012  *RADIOLOGY REPORT*  Clinical Data: Left lower quadrant pain  CT ABDOMEN AND PELVIS WITH CONTRAST  Technique:  Multidetector CT imaging of the abdomen and pelvis was performed following the standard protocol during bolus administration of intravenous contrast.  Contrast: OMNIPAQUE IOHEXOL 300 MG/ML IJ SOLN  Comparison: 03/29/2011  Findings: Acute sigmoid diverticulitis.  Several diverticuli in the sigmoid colon and descending colon are noted.  There is severe wall thickening of a short segment of the sigmoid colon associated with stranding in the adjacent fat.  There is no abscess formation. There is a small amount of extraluminal bowel gas surrounding one of the diverticuli on image 70 compatible with a micro perforation.  Tiny patchy density at the base of the lateral right lower lobe.  Diffuse hepatic steatosis.  Post cholecystectomy.  Spleen, pancreas, adrenal glands, and kidneys are within normal limits. Small para-aortic nodes.  IMPRESSION: Acute sigmoid diverticulitis associated with a small amount of extraluminal bowel gas.  Correlate clinically as for the need for follow-up imaging.  Critical Value/emergent results were called by telephone at the time of interpretation on 01/20/2012  at 2000 hours  to  Dr. Hal Hope, who verbally acknowledged these results.  Original Report Authenticated By: Donavan Burnet, M.D.    ROS Per HPI with inclusion of following: Denies chest pain Endorses some difficulty breathing due to pain in her abdomen with deep breaths Denies cough Denies dysuria. Endorses worsening of urge incontinence due to dry-heaving.   Blood pressure 137/83, pulse 113, temperature 98.7 F (37.1 C), resp. rate 16,  height 5' 4.5" (1.638 m), weight 206 lb (93.441 kg), SpO2 95.00%. Physical Exam  Gen: appears uncomfortable with occasional spasms of abdominal pain, accompanied by husband HEENT:  dry MM, EOMI, no nasal congestion CV: RRR, no m/r/g Pulm: CTAB without w/r/r Abd: NABS, soft, significant tenderness with guarding left-lower quadrant without obvious rebound Ext: no swelling Skin: no rash  Assessment/Plan his is a 51 year old with a history of GERD and several episodes of diverticulitis on immunosuppressants for Sjogren's syndrome/lupus presenting with acute sigmoid diverticulitis and microperforation.  GI Sigmoid diverticulitis with microperforation Elevated lipase History of diverticulitis History of GERD -She reports having several episodes of diverticulitis, most recently a few years ago. The patient is a good historian and does not recall having previous bowel perforation. She reports improving with IV antibiotics. She was on immunosuppressants during previous episodes of diverticulitis. -Dr. Juanda Chance (GI) notified of patient's admission and will try to see patient tomorrow. -Will treat with Zosyn and gentamicin, covering for anaerobes/Pseudomonas/GN/enterococcus in this immunosuppressed individual.  -Will consult surgery in the AM for evaluation. Patient would like to avoid surgery at this time, however, is amenable to consultation.  -Will give Zofran  and Ativan prn nausea, which has worked well for patient in the past.  -Morphine prn pain.  -Protonix IV -NPO -Regarding elevated lipase, monitor for now. Will give fluids and control abdominal pain.   RHEUM History of Sjogren's syndrome/lupus -Followed by Dr. Randie Heinz at [facility]. -Continue Imuran for now.   CV History of hypertension -Continue home losartan  PULM History of asthma -Albuterol prn   PSYCH History of ?depression History of fibromyalgia -Continue home depakote  FEN/GI Dehydration -1L NS bolus; IVF @ 125  mL/hr -Diet: NPO  PPx -DVT PPx: heparin SQ -SUP: Protonix  Disposition: pending clinical improvement   OH PARK, Elon Lomeli 01/20/2012, 10:57 PM

## 2012-01-20 NOTE — Telephone Encounter (Signed)
If I have any cancellations tomorrow, I will see her.

## 2012-01-20 NOTE — Progress Notes (Signed)
PHARMACY - ANTIBIOTIC CONSULT  INITIAL NOTE  Pharmacy Consult for: Gentamicin  Indication: Intra-abdominal infection   Patient Data:   Allergies: Allergies  Allergen Reactions  . Meperidine Hcl Other (See Comments)    tachycardia  . Doxycycline Hives  . Latex Other (See Comments)    Breathing problems.  . Phenergan Other (See Comments)    "uncontrolled vomiting"  . Sulfonamide Derivatives Hives  . Tetracyclines & Related Rash    Patient Measurements: Height: 5' 4.5" (163.8 cm) Weight: 206 lb (93.441 kg) IBW/kg (Calculated) : 55.85  Adjusted Body Weight: 71 kg   Vital Signs: Temp:  [98.7 F (37.1 C)-100.2 F (37.9 C)] 98.7 F (37.1 C) (03/11 2135) Pulse Rate:  [104-113] 113  (03/11 2135) Resp:  [16] 16  (03/11 2135) BP: (129-137)/(83) 137/83 mmHg (03/11 2135) SpO2:  [95 %] 95 % (03/11 2135) Weight:  [206 lb (93.441 kg)] 206 lb (93.441 kg) (03/11 2135)  Intake/Output from previous day: No intake or output data in the 24 hours ending 01/20/12 2316  Labs:  Basename 01/20/12 1542 01/20/12 1532  WBC 6.8 --  HGB 12.9 --  PLT -- --  LABCREA -- --  CREATININE -- 0.71   Estimated Creatinine Clearance: 93.1 ml/min (by C-G formula based on Cr of 0.71). No results found for this basename: VANCOTROUGH:2,VANCOPEAK:2,VANCORANDOM:2,GENTTROUGH:2,GENTPEAK:2,GENTRANDOM:2,TOBRATROUGH:2,TOBRAPEAK:2,TOBRARND:2,AMIKACINPEAK:2,AMIKACINTROU:2,AMIKACIN:2, in the last 72 hours   Microbiology: No results found for this or any previous visit (from the past 720 hour(s)).  Medical History: Past Medical History  Diagnosis Date  . Hypertension   . Asthma   . Lupus   . Headache     migraines  . GERD (gastroesophageal reflux disease)   . Cancer     hx melanoma  . Complication of anesthesia     Sjogrens Syndrome  . Sjogren's syndrome   . Shortness of breath     "related to the asthma"  . Vasculitis     "autoimmune"  . Fibromyalgia     Scheduled Medications:     .  azaTHIOprine  100 mg Oral Daily  . divalproex  500 mg Oral Daily  . heparin  5,000 Units Subcutaneous Q8H  . losartan  100 mg Oral Daily  . pantoprazole (PROTONIX) IV  40 mg Intravenous Q24H  . piperacillin-tazobactam (ZOSYN)  IV  3.375 g Intravenous Q8H  . sodium chloride  1,000 mL Intravenous Once  . DISCONTD: ampicillin (OMNIPEN) IV  2 g Intravenous Q6H  . DISCONTD: metronidazole  500 mg Intravenous Q8H      Assessment:  51 y.o. female admitted on 01/20/2012 with CT = acute sigmoid diverticulitis, no abscess formation, with small amount of extraluminal bowel gas. Pharmacy consulted to manage gentamicin. Patient is also to receive Zosyn.    Goal of Therapy:  1. 10 hour gentamicin level < 5 mcg/mL    Plan:  1. Gentamicin 500mg  IV Q24H.  2. Gentamicin level 10 hours after infusion started.      Dineen Kid Thad Ranger, PharmD 01/20/2012, 11:16 PM

## 2012-01-21 ENCOUNTER — Telehealth: Payer: Self-pay | Admitting: *Deleted

## 2012-01-21 DIAGNOSIS — K5720 Diverticulitis of large intestine with perforation and abscess without bleeding: Secondary | ICD-10-CM | POA: Diagnosis present

## 2012-01-21 DIAGNOSIS — K5732 Diverticulitis of large intestine without perforation or abscess without bleeding: Secondary | ICD-10-CM

## 2012-01-21 DIAGNOSIS — M329 Systemic lupus erythematosus, unspecified: Secondary | ICD-10-CM

## 2012-01-21 DIAGNOSIS — K219 Gastro-esophageal reflux disease without esophagitis: Secondary | ICD-10-CM

## 2012-01-21 DIAGNOSIS — I10 Essential (primary) hypertension: Secondary | ICD-10-CM

## 2012-01-21 LAB — CBC
HCT: 34.9 % — ABNORMAL LOW (ref 36.0–46.0)
MCH: 28.2 pg (ref 26.0–34.0)
MCV: 81.9 fL (ref 78.0–100.0)
Platelets: 191 10*3/uL (ref 150–400)
RBC: 4.26 MIL/uL (ref 3.87–5.11)
WBC: 5.5 10*3/uL (ref 4.0–10.5)

## 2012-01-21 LAB — COMPREHENSIVE METABOLIC PANEL
AST: 65 U/L — ABNORMAL HIGH (ref 0–37)
Albumin: 2.9 g/dL — ABNORMAL LOW (ref 3.5–5.2)
Alkaline Phosphatase: 79 U/L (ref 39–117)
Chloride: 107 mEq/L (ref 96–112)
Potassium: 3.6 mEq/L (ref 3.5–5.1)
Total Bilirubin: 0.5 mg/dL (ref 0.3–1.2)
Total Protein: 6.3 g/dL (ref 6.0–8.3)

## 2012-01-21 LAB — GENTAMICIN LEVEL, RANDOM: Gentamicin Rm: 2.7 ug/mL

## 2012-01-21 MED ORDER — CHLORHEXIDINE GLUCONATE 0.12 % MT SOLN
15.0000 mL | Freq: Two times a day (BID) | OROMUCOSAL | Status: DC
  Administered 2012-01-21 – 2012-01-24 (×7): 15 mL via OROMUCOSAL
  Filled 2012-01-21 (×7): qty 15

## 2012-01-21 MED ORDER — BIOTENE DRY MOUTH MT LIQD
15.0000 mL | Freq: Two times a day (BID) | OROMUCOSAL | Status: DC
  Administered 2012-01-21 – 2012-01-24 (×5): 15 mL via OROMUCOSAL

## 2012-01-21 NOTE — Consult Note (Signed)
Seen and agree.  She will need resection at some point.    Recommend getting her over the acute attack and doing this electively if she can.

## 2012-01-21 NOTE — Telephone Encounter (Signed)
Spoke with patient and she wanted Dr. Juanda Chance to know she  is an inpatient for bowel perforation.

## 2012-01-21 NOTE — Progress Notes (Signed)
ANTIBIOTIC CONSULT NOTE - FOLLOW UP  Pharmacy Consult for Gentamicin Indication: Intra-abdominal infection  Allergies  Allergen Reactions  . Meperidine Hcl Other (See Comments)    tachycardia  . Doxycycline Hives  . Latex Other (See Comments)    Breathing problems.  . Phenergan Other (See Comments)    "uncontrolled vomiting"  . Sulfonamide Derivatives Hives  . Tetracyclines & Related Rash    Patient Measurements: Height: 5' 4.5" (163.8 cm) Weight: 206 lb (93.441 kg) IBW/kg (Calculated) : 55.85    Vital Signs: Temp Readings from Last 3 Encounters:  01/21/12 99.4 F (37.4 C) Oral  01/20/12 100.2 F (37.9 C) Oral  01/10/12 98.5 F (36.9 C) Oral    Intake/Output from previous day: 03/11 0701 - 03/12 0700 In: 966.1 [I.V.:916.1; IV Piggyback:50] Out: 800 [Urine:800]  Labs:  [facility] 01/21/12 0512 01/20/12 1542 01/20/12 1532  WBC 5.5 6.8 --  HGB 12.0 12.9 --  PLT 191 -- --  LABCREA -- -- --  CREATININE 0.81 -- 0.71    Gentamicin random level (10 hours post-dose) = 2.7 mcg/ml   Anti-infectives Anti-infectives     Start     Dose/Rate Route Frequency Ordered Stop   01/21/12 0000   ampicillin (OMNIPEN) 2 g in sodium chloride 0.9 % 50 mL IVPB  Status:  Discontinued        2 g 150 mL/hr over 20 Minutes Intravenous 4 times per day 01/20/12 2228 01/20/12 2314   01/20/12 2359  piperacillin-tazobactam (ZOSYN) IVPB 3.375 g       3.375 g 12.5 mL/hr over 240 Minutes Intravenous Every 8 hours 01/20/12 2316     01/20/12 2359   gentamicin (GARAMYCIN) 500 mg in dextrose 5 % 100 mL IVPB        500 mg 112.5 mL/hr over 60 Minutes Intravenous Every 24 hours 01/20/12 2329     01/20/12 2230   metroNIDAZOLE (FLAGYL) IVPB 500 mg  Status:  Discontinued        500 mg 100 mL/hr over 60 Minutes Intravenous Every 8 hours 01/20/12 2228 01/20/12 2314          Assessment:  Patient is a 51 y/o female on Gentamicin extended-interval for an intra-abdominal infection.  Also receiving  Zosyn 3.375 gm IV q 8 hours.  Gentamicin 10 hour level within therapeutic range.  No adjustments in dosage required.  Plan:   Continue Gentamicin 500 mg IV q 24 hours.  Follow renal function.   Chaniyah Jahr, Elisha Headland, Pharm.D. 01/21/2012 4:37 PM

## 2012-01-21 NOTE — Consult Note (Signed)
Reason for Consult:Diverticulitis Consulting Surgeon: Cornett Referring Physician: Sheffield Slider   HPI: Abigail [name] is an 51 y.o. female with prior history of diverticulitis. She had a GI doctor in [place] and last had a colonoscopy in 2010. She sees Dr. Hal Hope and Dr. Juanda Chance here in town. She usually adheres to a strict diet to keep her from getting recurrent diverticular attacks. She developed some LLQ pain a few days ago and saw her PCP, who ordered a CT scan. The scan shows sigmoid diverticulitis with small gas locule c/w microperforation. She has been admitted and placed on abx. She is also chronically immunocompromised for Sjogren's/lupus and is on Imuran. She has additional medical problems as listed below. Surgery consult requested due to finding of microperforation.  Past Medical History:  Past Medical History  Diagnosis Date  . Hypertension   . Asthma   . Lupus   . Headache     migraines  . GERD (gastroesophageal reflux disease)   . Cancer     hx melanoma  . Complication of anesthesia     Sjogrens Syndrome  . Sjogren's syndrome   . Shortness of breath     "related to the asthma"  . Vasculitis     "autoimmune"  . Fibromyalgia     Surgical History:  Past Surgical History  Procedure Date  . Melanoma excision   . Tmj arthroplasty 1970  . Appendectomy   . Cholecystectomy   . Pancreas surgery     sphincterotomy  . Abdominoplasty   . Liver biopsy   . Breast surgery   . Anterior cervical decomp/discectomy fusion 10/29/2011    Procedure: ANTERIOR CERVICAL DECOMPRESSION/DISCECTOMY FUSION 2 LEVELS;  Surgeon: Karn Cassis;  Location: [facility];  Service: Neurosurgery;  Laterality: N/A;  Cervical five-six,Cervical six-seven nterior cervical decompression/diskectomy, fusion, plate  . Abdominal hysterectomy     Partial    Family History: History reviewed. No pertinent family history.  Social History:  reports that she has never smoked. She has never used smokeless  tobacco. She reports that she drinks alcohol. She reports that she does not use illicit drugs.  Allergies:  Allergies  Allergen Reactions  . Meperidine Hcl Other (See Comments)    tachycardia  . Doxycycline Hives  . Latex Other (See Comments)    Breathing problems.  . Phenergan Other (See Comments)    "uncontrolled vomiting"  . Sulfonamide Derivatives Hives  . Tetracyclines & Related Rash    Medications:  Prior to Admission:  Prescriptions prior to admission  Medication Sig Dispense Refill  . albuterol (PROVENTIL HFA;VENTOLIN HFA) 108 (90 BASE) MCG/ACT inhaler Inhale 2 puffs into the lungs every 6 (six) hours as needed. For shortness of breath.  1 Inhaler  2  . azaTHIOprine (IMURAN) 50 MG tablet Take 100 mg by mouth daily.        Marland Kitchen azelastine (ASTELIN) 137 MCG/SPRAY nasal spray Place 2 sprays into the nose daily as needed. For stuffy nose.      . divalproex (DEPAKOTE) 500 MG DR tablet Take 500 mg by mouth daily.        Marland Kitchen LORazepam (ATIVAN) 1 MG tablet Take 1 mg by mouth every 8 (eight) hours.      Marland Kitchen losartan (COZAAR) 50 MG tablet Take 100 mg by mouth daily.       . ranitidine (ZANTAC) 75 MG tablet Take 75 mg by mouth daily.        Marland Kitchen DISCONTD: azelastine (ASTELIN) 137 MCG/SPRAY nasal spray Place 2 sprays  into the nose daily as needed. For stuffy nose.  30 mL  5    ROS: See HPI for pertinent findings, otherwise complete 10 system review negative.  Physical Exam: Blood pressure 113/56, pulse 102, temperature 99.3 F (37.4 C), temperature source Oral, resp. rate 16, height 5' 4.5" (1.638 m), weight 93.441 kg (206 lb), SpO2 94.00%.  General Appearance:  Alert, cooperative, no distress, appears stated age  Head:  Normocephalic, without obvious abnormality, atraumatic  ENT: Unremarkable  Neck: Supple, symmetrical, trachea midline, no adenopathy, thyroid: not enlarged, symmetric, no tenderness/mass/nodules  Lungs:   Clear to auscultation bilaterally, no w/r/r, respirations unlabored  without use of accessory muscles.  Chest Wall:  No tenderness or deformity  Heart:  Regular rate and rhythm, S1, S2 normal, no murmur, rub or gallop. Carotids 2+ without bruit.  Abdomen:   Soft, and non-distended. Tender in the LLQ without mass effect or peritoneal signs. Bowel sounds active all four quadrants,  no masses, no organomegaly.  Genitalia:  Normal. No hernias  Rectal:  Deferred.  Extremities: Extremities normal, atraumatic, no cyanosis or edema  Pulses: 2+ and symmetric  Skin: Skin color, texture, turgor normal, no rashes or lesions  Neurologic: Normal affect, no gross deficits.     Labs: CBC  Basename 01/21/12 0512 01/20/12 1542  WBC 5.5 6.8  HGB 12.0 12.9  HCT 34.9* 39.7  PLT 191 --   MET  Basename 01/21/12 0512 01/20/12 1532  NA 137 139  K 3.6 3.9  CL 107 105  CO2 23 23  GLUCOSE 93 100*  BUN 7 9  CREATININE 0.81 0.71  CALCIUM 8.4 8.8    Basename 01/21/12 0512 01/20/12 1532  PROT 6.3 --  ALBUMIN 2.9* --  AST 65* --  ALT 64* --  ALKPHOS 79 --  BILITOT 0.5 --  BILIDIR -- --  IBILI -- --  LIPASE -- 81*   PT/INR No results found for this basename: LABPROT:2,INR:2 in the last 72 hours ABG No results found for this basename: PHART:2,PCO2:2,PO2:2,HCO3:2 in the last 72 hours    Ct Abdomen Pelvis W Contrast  01/20/2012  *RADIOLOGY REPORT*  Clinical Data: Left lower quadrant pain  CT ABDOMEN AND PELVIS WITH CONTRAST  Technique:  Multidetector CT imaging of the abdomen and pelvis was performed following the standard protocol during bolus administration of intravenous contrast.  Contrast: OMNIPAQUE IOHEXOL 300 MG/ML IJ SOLN  Comparison: 03/29/2011  Findings: Acute sigmoid diverticulitis.  Several diverticuli in the sigmoid colon and descending colon are noted.  There is severe wall thickening of a short segment of the sigmoid colon associated with stranding in the adjacent fat.  There is no abscess formation. There is a small amount of extraluminal bowel  gas surrounding one of the diverticuli on image 70 compatible with a micro perforation.  Tiny patchy density at the base of the lateral right lower lobe.  Diffuse hepatic steatosis.  Post cholecystectomy.  Spleen, pancreas, adrenal glands, and kidneys are within normal limits. Small para-aortic nodes.  IMPRESSION: Acute sigmoid diverticulitis associated with a small amount of extraluminal bowel gas.  Correlate clinically as for the need for follow-up imaging.  Critical Value/emergent results were called by telephone at the time of interpretation on 01/20/2012  at 2000 hours  to  Dr. Hal Hope, who verbally acknowledged these results.  Original Report Authenticated By: Donavan Burnet, M.D.    Assessment/Plan: Active Problems:  Diverticulitis of large intestine with perforation This is apparently first time her diverticulitis has an associated  complication. Agree with bowel rest and IV abx as ordered. D/W pt and husband. She requests Dr. Juanda Chance be consulted.  Seen for Dr. Luisa Hart, will follow.     Marianna Fuss PA-C 01/21/2012, 10:48 AM

## 2012-01-21 NOTE — H&P (Signed)
I interviewed and examined this patient and discussed the care plan with Dr. Aviva Signs and Madolyn Frieze and the [facility] team and agree with assessment and plan as documented in the admission note for today.    Faizah Kandler A. Sheffield Slider, MD Family Medicine Teaching Service Attending  01/21/2012 2:15 PM

## 2012-01-21 NOTE — Telephone Encounter (Signed)
Patient calling back to let Dr. Juanda Chance know that they are going to request a GI consult. She is getting IV antibiotics and the perforation "has sealed off" and she has not had surgery. She is at [facility].

## 2012-01-21 NOTE — Progress Notes (Signed)
Daily Progress Note Family Medicine Teaching Service PGY-1   Patient name: Abigail Abigail [name] Abigail [name]  Medical record [id number] Date of birth:November 20, 1960 Age: 51 y.o. Gender: female  LOS: 1 day   Subjective: pt feeling better, pain controlled on medication.   Objective: Vitals: Patient Vitals for the past 24 hrs:  BP Temp Temp  P R SpO2 Height Weight  01/21/12 0053 137/83 98.7 F  Oral 113  16  95 % 5' 4.5"  206 lb     Intake/Output Summary (Last 24 hours) at 01/21/12 1008 Last data filed at 01/21/12 0600  Gross per 24 hour  Intake  966.1 ml  Output    800 ml  Net  166.1 ml   Physical Exam: Gen:  NAD HEENT: Moist mucous membranes CV: Regular rate and rhythm, no murmurs rubs or gallops PULM: Clear to auscultation bilaterally. No wheezes/rales/rhonchi ABD: Soft, tender on LLQ, non distended, no rebound (no peritoneal signs) normal bowel sounds EXT: No edema Neuro: Alert and oriented x3. No neurologic focalization   Labs and imaging:  CBC  Lab 01/21/12 0512 01/20/12 1542  WBC 5.5 6.8  HGB 12.0 12.9  HCT 34.9* 39.7  PLT 191 --   BMET  Lab 01/21/12 0512 01/20/12 1532  NA 137 139  K 3.6 3.9  CL 107 105  CO2 23 23  BUN 7 9  CREATININE 0.81 0.71  LABGLOM -- --  GLUCOSE 93 --  CALCIUM 8.4 8.8    Ct Abdomen Pelvis W Contrast  01/20/2012.  IMPRESSION: Acute sigmoid diverticulitis associated with a small amount of extraluminal bowel gas.   Medications: Scheduled Meds:   . antiseptic oral rinse  15 mL Mouth Rinse q12n4p  . azaTHIOprine  100 mg Oral Daily  . chlorhexidine  15 mL Mouth Rinse BID  . divalproex  500 mg Oral Daily  . gentamicin  500 mg Intravenous Q24H  . heparin  5,000 Units Subcutaneous Q8H  . losartan  100 mg Oral Daily  . pantoprazole (PROTONIX) IV  40 mg Intravenous Q24H  . piperacillin-tazobactam (ZOSYN)  IV  3.375 g Intravenous Q8H   Continuous Infusions:   . sodium chloride 125 mL/hr at 01/21/12 0837   PRN Meds:.albuterol, azelastine,  LORazepam, morphine injection, ondansetron (ZOFRAN) IV  Assessment and Plan:  51 year old with a history of GERD and several episodes of diverticulitis on immunosuppressants for Sjogren's syndrome/lupus presenting with acute sigmoid diverticulitis and microperforation. 1. GISigmoid diverticulitis with microperforation, Elevated lipase, Hx of diverticulitis, Hx of GERD: Pain under control with IV morphine -On Gentamycin and Zosyn -Consulted with Surgery.  2 Sjogren's Syndrome: She was on immunosuppressants during previous episodes of diverticulitis. - Will contact Abigail Abigail [name] Abigail [name] at [facility]. - Continue Imuran  3. HTN ( controlled) -Continue Losartan  4. Asthma ( no SOB, cough, wheezing) - Continue Albuterol PRN  5. Depression/Fibromyalgia - Continue Depakote  FEN/GI: NPO today untill surgery consults. IVf NS 167ml/h Prophylaxis:  Heparin and  PPI Disposition: pending improvement.  D. Abigail Rolene Arbour, MD PGY1, [facility] Medicine Teaching Service Pager [phone] 01/21/2012

## 2012-01-21 NOTE — Progress Notes (Signed)
I interviewed and examined this patient and discussed the care plan with Dr Aviva Signs and the [facility] team and agree with assessment and plan as documented in the progress note for today.    Mandela Bello A. Sheffield Slider, MD Family Medicine Teaching Service Attending  01/21/2012 2:31 PM

## 2012-01-22 MED ORDER — PANTOPRAZOLE SODIUM 40 MG PO TBEC
40.0000 mg | DELAYED_RELEASE_TABLET | Freq: Every day | ORAL | Status: DC
  Administered 2012-01-22 – 2012-01-24 (×3): 40 mg via ORAL
  Filled 2012-01-22 (×3): qty 1

## 2012-01-22 NOTE — Progress Notes (Signed)
Patient ID: Abigail [name], female   DOB: June 17, 1961, 51 y.o.   MRN: [id number]    Subjective: Pt feeling much better today.  Pain almost gone.  No nausea.  + flatus, no BM  Objective: Vital signs in last 24 hours: Temp:  [98.8 F (37.1 C)-100.8 F (38.2 C)] 98.8 F (37.1 C) (03/13 0526) Pulse Rate:  [93-112] 96  (03/13 0526) Resp:  [19-22] 19  (03/13 0526) BP: (118-138)/(63-83) 118/66 mmHg (03/13 0526) SpO2:  [94 %-95 %] 94 % (03/13 0526) Last BM Date: 01/20/12  Intake/Output from previous day: 03/12 0701 - 03/13 0700 In: 1126 [I.V.:1076; IV Piggyback:50] Out: 1250 [Urine:1250] Intake/Output this shift:    PE: Abd: soft, mildly tender in LLQ, +BS, ND  Lab Results:   [facility] 01/21/12 0512 01/20/12 1542  WBC 5.5 6.8  HGB 12.0 12.9  HCT 34.9* 39.7  PLT 191 --   BMET  Basename 01/21/12 0512 01/20/12 1532  NA 137 139  K 3.6 3.9  CL 107 105  CO2 23 23  GLUCOSE 93 100*  BUN 7 9  CREATININE 0.81 0.71  CALCIUM 8.4 8.8   PT/INR No results found for this basename: LABPROT:2,INR:2 in the last 72 hours   Studies/Results: Ct Abdomen Pelvis W Contrast  01/20/2012  *RADIOLOGY REPORT*  Clinical Data: Left lower quadrant pain  CT ABDOMEN AND PELVIS WITH CONTRAST  Technique:  Multidetector CT imaging of the abdomen and pelvis was performed following the standard protocol during bolus administration of intravenous contrast.  Contrast: OMNIPAQUE IOHEXOL 300 MG/ML IJ SOLN  Comparison: 03/29/2011  Findings: Acute sigmoid diverticulitis.  Several diverticuli in the sigmoid colon and descending colon are noted.  There is severe wall thickening of a short segment of the sigmoid colon associated with stranding in the adjacent fat.  There is no abscess formation. There is a small amount of extraluminal bowel gas surrounding one of the diverticuli on image 70 compatible with a micro perforation.  Tiny patchy density at the base of the lateral right lower lobe.  Diffuse hepatic  steatosis.  Post cholecystectomy.  Spleen, pancreas, adrenal glands, and kidneys are within normal limits. Small para-aortic nodes.  IMPRESSION: Acute sigmoid diverticulitis associated with a small amount of extraluminal bowel gas.  Correlate clinically as for the need for follow-up imaging.  Critical Value/emergent results were called by telephone at the time of interpretation on 01/20/2012  at 2000 hours  to  Dr. Hal Hope, who verbally acknowledged these results.  Original Report Authenticated By: Donavan Burnet, M.D.    Anti-infectives: Anti-infectives     Start     Dose/Rate Route Frequency Ordered Stop   01/21/12 0000   ampicillin (OMNIPEN) 2 g in sodium chloride 0.9 % 50 mL IVPB  Status:  Discontinued        2 g 150 mL/hr over 20 Minutes Intravenous 4 times per day 01/20/12 2228 01/20/12 2314   01/20/12 2359  piperacillin-tazobactam (ZOSYN) IVPB 3.375 g       3.375 g 12.5 mL/hr over 240 Minutes Intravenous Every 8 hours 01/20/12 2316     01/20/12 2359   gentamicin (GARAMYCIN) 500 mg in dextrose 5 % 100 mL IVPB        500 mg 112.5 mL/hr over 60 Minutes Intravenous Every 24 hours 01/20/12 2329     01/20/12 2230   metroNIDAZOLE (FLAGYL) IVPB 500 mg  Status:  Discontinued        500 mg 100 mL/hr over 60 Minutes Intravenous Every 8  hours 01/20/12 2228 01/20/12 2314           Assessment/Plan  1. Diverticulitis with microperforation  Plan: 1. Will allow clear liquid diet today 2. Hopefully patient can be treated conservatively with this episode, then look at resection electively in 6-8 weeks or so.  Discussed this with the patient this morning and she understands and agrees.  Will follow.   LOS: 2 days    Abigail [name] 01/22/2012

## 2012-01-22 NOTE — Progress Notes (Signed)
I interviewed and examined this patient and discussed the care plan with Dr. Aviva Signs and the [facility] team and agree with assessment and plan as documented in the progress note for today.    Cayden Rautio A. Sheffield Slider, MD Family Medicine Teaching Service Attending  01/22/2012 7:38 PM

## 2012-01-22 NOTE — Progress Notes (Signed)
Better.  Will need elective sigmoid colectomy down the road.

## 2012-01-22 NOTE — Progress Notes (Signed)
Daily Progress Note Family Medicine Teaching Service PGY-1   Patient name: Abigail [name]  Medical record [id number] Date of birth:1961-09-17 Age: 51 y.o. Gender: female  LOS: 2 days   Subjective: pt feeling better, pain controlled on medication.   Objective: Filed Vitals:   01/22/12 0526  BP: 118/66  Pulse: 96  Temp: 98.8 F (37.1 C)  Resp: 19    Intake/Output Summary (Last 24 hours) at 01/22/12 1007 Last data filed at 01/22/12 0653  Gross per 24 hour  Intake   1126 ml  Output   1250 ml  Net   -124 ml   Physical Exam: Gen:  NAD HEENT: Moist mucous membranes CV: Regular rate and rhythm, no murmurs rubs or gallops PULM: Clear to auscultation bilaterally. No wheezes/rales/rhonchi ABD: Soft, tender on LLQ, non distended, no rebound (no peritoneal signs) normal bowel sounds EXT: No edema Neuro: Alert and oriented x3. No neurologic focalization   Labs and imaging:    Medications: Scheduled Meds:   . antiseptic oral rinse  15 mL Mouth Rinse q12n4p  . chlorhexidine  15 mL Mouth Rinse BID  . divalproex  500 mg Oral Daily  . gentamicin  500 mg Intravenous Q24H  . heparin  5,000 Units Subcutaneous Q8H  . losartan  100 mg Oral Daily  . pantoprazole (PROTONIX) IV  40 mg Intravenous Q24H  . piperacillin-tazobactam (ZOSYN)  IV  3.375 g Intravenous Q8H  . DISCONTD: azaTHIOprine  100 mg Oral Daily   Continuous Infusions:   . sodium chloride 125 mL/hr at 01/22/12 0853   PRN Meds:.albuterol, azelastine, LORazepam, morphine injection, ondansetron (ZOFRAN) IV  Assessment and Plan:  51 year old with a history of GERD and several episodes of diverticulitis on immunosuppressants for Sjogren's syndrome/lupus presenting with acute sigmoid diverticulitis and microperforation.  1. Sigmoid diverticulitis with microperforation, Elevated lipase, Hx of diverticulitis, Hx of GERD: Pain under control with IV morphine now with good spacing between doses. -On Gentamycin and Zosyn.  Will have CT tomorrow. The plan is to advance diet today and will continue conservative tx with elective resection in 6-8 weeks. -Surgery f/u we appreciate recommendations.   2. Sjogren's Syndrome: She was on immunosuppressants during previous episodes of diverticulitis. - Dr. Satira Sark clair contacted. He saw pt recently. 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 Pt with good creatinine. He recommends to hold Imuran until this episode of diverticulitis is resolved.  3. HTN ( controlled) -Continue Losartan  4. Asthma ( no SOB, cough, wheezing) - Continue Albuterol PRN  5. Depression/Fibromyalgia - Continue Depakote  FEN/GI: advance diet to clear liquid diet. IV decreased to 75 ml/h Prophylaxis:  Heparin Disposition: pending improvement.  D. [place] Rolene Arbour, MD PGY1, [facility] Medicine Teaching Service Pager [phone] 01/22/2012

## 2012-01-23 LAB — BASIC METABOLIC PANEL
CO2: 24 mEq/L (ref 19–32)
Calcium: 8.8 mg/dL (ref 8.4–10.5)
GFR calc Af Amer: >90 mL/min (ref >90)
GFR calc non Af Amer: >90 mL/min (ref >90)
Sodium: 137 mEq/L (ref 135–145)

## 2012-01-23 LAB — CBC
MCV: 81.4 fL (ref 78.0–100.0)
Platelets: 209 10*3/uL (ref 150–400)
RBC: 4.3 MIL/uL (ref 3.87–5.11)
WBC: 3.4 10*3/uL — ABNORMAL LOW (ref 4.0–10.5)

## 2012-01-23 MED ORDER — BOOST / RESOURCE BREEZE PO LIQD
1.0000 | Freq: Three times a day (TID) | ORAL | Status: DC
  Administered 2012-01-23 (×2): 1 via ORAL

## 2012-01-23 MED ORDER — CEFDINIR 125 MG/5ML PO SUSR
250.0000 mg | Freq: Two times a day (BID) | ORAL | Status: DC
  Administered 2012-01-23 – 2012-01-24 (×3): 250 mg via ORAL
  Filled 2012-01-23 (×4): qty 10

## 2012-01-23 MED ORDER — METRONIDAZOLE 500 MG PO TABS
500.0000 mg | ORAL_TABLET | Freq: Three times a day (TID) | ORAL | Status: DC
  Administered 2012-01-23 – 2012-01-24 (×4): 500 mg via ORAL
  Filled 2012-01-23 (×6): qty 1

## 2012-01-23 MED ORDER — CIPROFLOXACIN HCL 500 MG PO TABS
500.0000 mg | ORAL_TABLET | Freq: Two times a day (BID) | ORAL | Status: DC
  Administered 2012-01-23: 500 mg via ORAL
  Filled 2012-01-23 (×3): qty 1

## 2012-01-23 NOTE — Progress Notes (Signed)
Patient ID: Abigail [name], female   DOB: Dec 09, 1960, 51 y.o.   MRN: [id number]    Subjective: The patient has multiple c/o overnight events, such as issues with her IV and IV pole.  She did have some elevation in abdominal pain and minimal nausea.  She c/o a HA this morning.  She did pass flatus  Objective: Vital signs in last 24 hours: Temp:  [98.6 F (37 C)-100.3 F (37.9 C)] 98.8 F (37.1 C) (03/14 0630) Pulse Rate:  [74-92] 74  (03/14 0630) Resp:  [18-20] 18  (03/14 0630) BP: (130-142)/(77-89) 132/80 mmHg (03/14 0630) SpO2:  [96 %-97 %] 96 % (03/14 0630) Last BM Date: 01/20/12  Intake/Output from previous day: 03/13 0701 - 03/14 0700 In: 4856.7 [P.O.:720; I.V.:3711.7; IV Piggyback:425] Out: 1450 [Urine:1450] Intake/Output this shift:    PE: Abd: soft, tender in LLQ, maybe slightly worse than yesterday, +BS, ND Heart: regular Lungs: CTAB  Lab Results:   [facility] 01/23/12 0628 01/21/12 0512  WBC 3.4* 5.5  HGB 12.0 12.0  HCT 35.0* 34.9*  PLT 209 191   BMET  Basename 01/23/12 0628 01/21/12 0512  NA 137 137  K 3.6 3.6  CL 106 107  CO2 24 23  GLUCOSE 86 93  BUN 6 7  CREATININE 0.74 0.81  CALCIUM 8.8 8.4   PT/INR No results found for this basename: LABPROT:2,INR:2 in the last 72 hours   Studies/Results: No results found.  Anti-infectives: Anti-infectives     Start     Dose/Rate Route Frequency Ordered Stop   01/21/12 0000   ampicillin (OMNIPEN) 2 g in sodium chloride 0.9 % 50 mL IVPB  Status:  Discontinued        2 g 150 mL/hr over 20 Minutes Intravenous 4 times per day 01/20/12 2228 01/20/12 2314   01/20/12 2359  piperacillin-tazobactam (ZOSYN) IVPB 3.375 g       3.375 g 12.5 mL/hr over 240 Minutes Intravenous Every 8 hours 01/20/12 2316     01/20/12 2359   gentamicin (GARAMYCIN) 500 mg in dextrose 5 % 100 mL IVPB        500 mg 112.5 mL/hr over 60 Minutes Intravenous Every 24 hours 01/20/12 2329     01/20/12 2230   metroNIDAZOLE (FLAGYL) IVPB 500 mg   Status:  Discontinued        500 mg 100 mL/hr over 60 Minutes Intravenous Every 8 hours 01/20/12 2228 01/20/12 2314           Assessment/Plan  1. Diverticulitis with microperf  Plan: 1. Due to patient's c/o slight increase in pain and nausea, I will continue her on clear liquids for now.  I suspect if she has a better day today that we can advance her tomorrow.  However, if she continues to have some worsening pain or nausea then we may need to hold off and plan on repeat CT scan around Saturday.  LOS: 3 days    Eupha Lobb E 01/23/2012

## 2012-01-23 NOTE — Progress Notes (Signed)
Subjective: Patient had a difficult time sleeping last night due to her IV monitors constantly going off. She reports some increase in pain sometimes when taking in clears yesterday, but overall her pain is improved. She had a small bowel movement yesterday that did not look bloody although was painful to have.   Objective: Vital signs in last 24 hours: Temp:  [98.6 F (37 C)-100.3 F (37.9 C)] 98.8 F (37.1 C) (03/14 0630) Pulse Rate:  [74-92] 74  (03/14 0630) Resp:  [18-20] 18  (03/14 0630) BP: (130-142)/(77-89) 132/80 mmHg (03/14 0630) SpO2:  [96 %-97 %] 96 % (03/14 0630) Weight change:  Last BM Date: 01/20/12  Intake/Output from previous day: 03/13 0701 - 03/14 0700 In: 4856.7 [P.O.:720; I.V.:3711.7; IV Piggyback:425] Out: 1450 [Urine:1450] Intake/Output this shift:   Gen: NAD, sitting up in bed using computer Psych: engaged, appropriate, awake/alert Pulm: NI WOB Abd: decreased BS, soft, non-distended, moderate TTP LLQ without rebound; may still be having some guarding, although improved from admission Ext: no edema or calf tenderness   Lab Results:  [facility] 01/23/12 0628 01/21/12 0512  WBC 3.4* 5.5  HGB 12.0 12.0  HCT 35.0* 34.9*  PLT 209 191   BMET  Basename 01/23/12 0628 01/21/12 0512  NA 137 137  K 3.6 3.6  CL 106 107  CO2 24 23  GLUCOSE 86 93  BUN 6 7  CREATININE 0.74 0.81  CALCIUM 8.8 8.4    Studies/Results: No results found.  Medications: I have reviewed the patient's current medications.  Assessment/Plan: 51 year old with a history of GERD and several episodes of diverticulitis on immunosuppressants for Sjogren's syndrome/lupus presenting with acute sigmoid diverticulitis and microperforation.   Sigmoid diverticulitis with microperforation Elevated lipase on admission Hx of multiple episodes of diverticulitis in the past Hx of GERD Patient still requiring frequent doses of morphine, however, the patient reports improvement in pain.  High-grade temperature of 100.4, but no true fevers and with decreasing WBC (now low at 3.4). -Surgery following. Appreciate their consultation. Plan for surgery in 6-8 weeks.  -On D3 of gentamycin/Zosyn>>>transition to omnicef/Flagyl (considered ciprofloxacin/Flagyl, however, after speaking with our pharmacist Dr. Raymondo Band, ciprofloxacin has poor gram-negative coverage in this hospital--70's--whereas 3rd generation cephalosporins have coverage in the 90s).  -Clear diet>>>advance to full liquids -Continue morphine IV prn pain -Will not need repeat CT-abdomen if patient improving. -AM CBC  Sjogren's Syndrome on home Imuran -OK with rheumatologist Dr. Alene Mires to hold Imuran until diverticulitis resolves.   History of HTN -Controlled on home losartan.  History of asthma -Controlled. Albuterol prn.  History of depression/fibromyalgia -Continue home depakote -Continue Losartan   FEN/GI:  -Diet: clears>>>advance to full liquids -NS @ 75>>>KVO  PPx: -DVT PPx: heparin SQ  Disposition: Pending improvement in abdominal pain, ability to tolerate soft foods, and clinical improvement. Possible discharge Friday/weekend.     LOS: 3 days   OH PARK, Keva Darty 01/23/2012, 9:39 AM

## 2012-01-23 NOTE — Progress Notes (Signed)
Agree.  Follow for now and leave on clears.  Repeat CT in next 1 - 2 days if pain does not resolve.

## 2012-01-23 NOTE — Progress Notes (Signed)
I interviewed and examined this patient and discussed the care plan with Dr. Madolyn Frieze and the [facility] team and agree with assessment and plan as documented in the progress note for today. I discussed the reasoning for not doing CT scans more often than necessary.     Lynix Bonine A. Sheffield Slider, MD Family Medicine Teaching Service Attending  01/23/2012 4:21 PM

## 2012-01-24 LAB — CBC
HCT: 36.3 % (ref 36.0–46.0)
Hemoglobin: 12.8 g/dL (ref 12.0–15.0)
RBC: 4.49 MIL/uL (ref 3.87–5.11)
WBC: 3 10*3/uL — ABNORMAL LOW (ref 4.0–10.5)

## 2012-01-24 MED ORDER — CEFDINIR 125 MG/5ML PO SUSR: 250.0000 mg | Freq: Two times a day (BID) | ORAL | Status: AC

## 2012-01-24 MED ORDER — METRONIDAZOLE 500 MG PO TABS: 500.0000 mg | ORAL_TABLET | Freq: Three times a day (TID) | ORAL | Status: AC

## 2012-01-24 NOTE — Discharge Summary (Signed)
I interviewed and examined this patient and discussed the care plan with Dr. Aviva Signs and the [facility] team and agree with assessment and plan as documented in the discharge note for today.    Peretz Thieme A. Sheffield Slider, MD Family Medicine Teaching Service Attending  01/24/2012 9:43 PM

## 2012-01-24 NOTE — Progress Notes (Signed)
Can see me in follow up to discuss sigmoid colectomy

## 2012-01-24 NOTE — Progress Notes (Signed)
Patient ID: Abigail KOOGLER, female   DOB: February 10, 1961, 51 y.o.   MRN: [id number]    Subjective: Still having some pain and some nausea, but overall doing much better.  Is asking when she would be able to leave!  Would like to go today if possible.  + flatus and 2 small BMs.   Objective: Vital signs in last 24 hours: Temp:  [98.8 F (37.1 C)-99.6 F (37.6 C)] 98.8 F (37.1 C) (03/15 0529) Pulse Rate:  [67-95] 67  (03/15 0529) Resp:  [16-18] 18  (03/15 0529) BP: (125-141)/(83-92) 141/88 mmHg (03/15 0529) SpO2:  [94 %-95 %] 95 % (03/15 0529) Last BM Date: 01/23/12  Intake/Output from previous day: 03/14 0701 - 03/15 0700 In: 296.1 [I.V.:296.1] Out: -  Intake/Output this shift:    PE: Abd: soft, some tenderness, +BS, nondistended  Lab Results:   [facility] 01/23/12 0628  WBC 3.4*  HGB 12.0  HCT 35.0*  PLT 209   BMET  Basename 01/23/12 0628  NA 137  K 3.6  CL 106  CO2 24  GLUCOSE 86  BUN 6  CREATININE 0.74  CALCIUM 8.8   PT/INR No results found for this basename: LABPROT:2,INR:2 in the last 72 hours   Studies/Results: No results found.  Anti-infectives: Anti-infectives     Start     Dose/Rate Route Frequency Ordered Stop   01/23/12 1400   metroNIDAZOLE (FLAGYL) tablet 500 mg        500 mg Oral 3 times per day 01/23/12 0954     01/23/12 1400   cefdinir (OMNICEF) 125 MG/5ML suspension 250 mg        250 mg Oral 2 times daily 01/23/12 1254     01/23/12 1100   ciprofloxacin (CIPRO) tablet 500 mg  Status:  Discontinued        500 mg Oral 2 times daily 01/23/12 0954 01/23/12 1254   01/21/12 0000   ampicillin (OMNIPEN) 2 g in sodium chloride 0.9 % 50 mL IVPB  Status:  Discontinued        2 g 150 mL/hr over 20 Minutes Intravenous 4 times per day 01/20/12 2228 01/20/12 2314   01/20/12 2359   piperacillin-tazobactam (ZOSYN) IVPB 3.375 g  Status:  Discontinued        3.375 g 12.5 mL/hr over 240 Minutes Intravenous Every 8 hours 01/20/12 2316 01/23/12 0954   01/20/12 2359   gentamicin (GARAMYCIN) 500 mg in dextrose 5 % 100 mL IVPB  Status:  Discontinued        500 mg 112.5 mL/hr over 60 Minutes Intravenous Every 24 hours 01/20/12 2329 01/23/12 0954   01/20/12 2230   metroNIDAZOLE (FLAGYL) IVPB 500 mg  Status:  Discontinued        500 mg 100 mL/hr over 60 Minutes Intravenous Every 8 hours 01/20/12 2228 01/20/12 2314           Assessment/Plan  1. Diverticulitis with microperf  Plan: 1. Will advance diet per pt request.  She will require a low fiber diet.  If patient tolerates, could potentially leave later today from a surgical standpoint.  Will discuss with attending surgeon, Dr. Luisa Hart, but likely will be ok.   2. Will need elective sigmoid colectomy in the future.    LOS: 4 days    Kilah Drahos 01/24/2012, 8:43 AM

## 2012-01-24 NOTE — Discharge Instructions (Signed)
Diverticulitis A diverticulum is a small pouch or sac on the colon. Diverticulosis is the presence of these diverticula on the colon. Diverticulitis is the irritation (inflammation) or infection of diverticula. CAUSES  The colon and its diverticula contain bacteria. If food particles block the tiny opening to a diverticulum, the bacteria inside can grow and cause an increase in pressure. This leads to infection and inflammation and is called diverticulitis. SYMPTOMS   Abdominal pain and tenderness. Usually, the pain is located on the left side of your abdomen. However, it could be located elsewhere.   Fever.   Bloating.   Feeling sick to your stomach (nausea).   Throwing up (vomiting).   Abnormal stools.  DIAGNOSIS  Your caregiver will take a history and perform a physical exam. Since many things can cause abdominal pain, other tests may be necessary. Tests may include:  Blood tests.   Urine tests.   X-ray of the abdomen.   CT scan of the abdomen.  Sometimes, surgery is needed to determine if diverticulitis or other conditions are causing your symptoms. TREATMENT  Most of the time, you can be treated without surgery. Treatment includes:  Resting the bowels by only having liquids for a few days. As you improve, you will need to eat a low-fiber diet.   Intravenous (IV) fluids if you are losing body fluids (dehydrated).   Antibiotic medicines that treat infections may be given.   Pain and nausea medicine, if needed.   Surgery if the inflamed diverticulum has burst.  HOME CARE INSTRUCTIONS   Try a clear liquid diet (broth, tea, or water for as long as directed by your caregiver). You may then gradually begin a low-fiber diet as tolerated. A low-fiber diet is a diet with less than 10 grams of fiber. Choose the foods below to reduce fiber in the diet:   White breads, cereals, rice, and pasta.   Cooked fruits and vegetables or soft fresh fruits and vegetables without the skin.     Ground or well-cooked tender beef, ham, veal, lamb, pork, or poultry.   Eggs and seafood.   After your diverticulitis symptoms have improved, your caregiver may put you on a high-fiber diet. A high-fiber diet includes 14 grams of fiber for every 1000 calories consumed. For a standard 2000 calorie diet, you would need 28 grams of fiber. Follow these diet guidelines to help you increase the fiber in your diet. It is important to slowly increase the amount fiber in your diet to avoid gas, constipation, and bloating.   Choose whole-grain breads, cereals, pasta, and brown rice.   Choose fresh fruits and vegetables with the skin on. Do not overcook vegetables because the more vegetables are cooked, the more fiber is lost.   Choose more nuts, seeds, legumes, dried peas, beans, and lentils.   Look for food products that have greater than 3 grams of fiber per serving on the Nutrition Facts label.   Take all medicine as directed by your caregiver.   If your caregiver has given you a follow-up appointment, it is very important that you go. Not going could result in lasting (chronic) or permanent injury, pain, and disability. If there is any problem keeping the appointment, call to reschedule.  SEEK MEDICAL CARE IF:   Your pain does not improve.   You have a hard time advancing your diet beyond clear liquids.   Your bowel movements do not return to normal.  SEEK IMMEDIATE MEDICAL CARE IF:   Your pain becomes   worse.   You have an oral temperature above 102 F (38.9 C), not controlled by medicine.   You have repeated vomiting.   You have bloody or black, tarry stools.   Symptoms that brought you to your caregiver become worse or are not getting better.  MAKE SURE YOU:   Understand these instructions.   Will watch your condition.   Will get help right away if you are not doing well or get worse.  Document Released: 08/07/2005 Document Revised: 10/17/2011 Document Reviewed:  12/03/2010 [employer] Patient Information 2012 [employer]. 

## 2012-01-24 NOTE — Discharge Summary (Signed)
Physician Discharge Summary  Patient ID: Abigail [name] [id number] 1960/12/28 51 y.o.  Admit date: 01/20/2012 Discharge date: 01/24/2012  PCP: Abigail [name]., MD, MD   Discharge Diagnosis: 1.Diverticulitis with microperforation. 2. Sjogren's Syndrome 3. HTN 4. Asthma 5. Depression/ fibromyalgia  Discharge Medications  [name], [name]  Home Medication Instructions [id number]   Printed on:01/24/12 1254  Medication Information                    losartan (COZAAR) 50 MG tablet Take 100 mg by mouth daily.            ranitidine (ZANTAC) 75 MG tablet Take 75 mg by mouth daily.             azaTHIOprine (IMURAN) 50 MG tablet Take 100 mg by mouth daily.             divalproex (DEPAKOTE) 500 MG DR tablet Take 500 mg by mouth daily.             albuterol (PROVENTIL HFA;VENTOLIN HFA) 108 (90 BASE) MCG/ACT inhaler Inhale 2 puffs into the lungs every 6 (six) hours as needed. For shortness of breath.           LORazepam (ATIVAN) 1 MG tablet Take 1 mg by mouth every 8 (eight) hours.           azelastine (ASTELIN) 137 MCG/SPRAY nasal spray Place 2 sprays into the nose daily as needed. For stuffy nose.              Consults: Surgery   Labs: CBC  BMET  Results for orders placed during the hospital encounter of 01/20/12 (from the past 72 hour(s))  BASIC METABOLIC PANEL     Status: Normal   Collection Time   01/23/12  6:28 AM      Component Value Range Comment   Sodium 137  135 - 145 (mEq/L)    Potassium 3.6  3.5 - 5.1 (mEq/L)    Chloride 106  96 - 112 (mEq/L)    CO2 24  19 - 32 (mEq/L)    Glucose, Bld 86  70 - 99 (mg/dL)    BUN 6  6 - 23 (mg/dL)    Creatinine, Ser 2.95  0.50 - 1.10 (mg/dL)    Calcium 8.8  8.4 - 10.5 (mg/dL)    GFR calc non Af Amer >90  >90 (mL/min)    GFR calc Af Amer >90  >90 (mL/min)   CBC     Status: Abnormal   Collection Time   01/23/12  6:28 AM      Component Value Range Comment   WBC 3.4 (*) 4.0 - 10.5 (K/uL)    RBC 4.30  3.87 - 5.11  (MIL/uL)    Hemoglobin 12.0  12.0 - 15.0 (g/dL)    HCT 28.4 (*) 13.2 - 46.0 (%)    MCV 81.4  78.0 - 100.0 (fL)    MCH 27.9  26.0 - 34.0 (pg)    MCHC 34.3  30.0 - 36.0 (g/dL)    RDW 44.0  10.2 - 72.5 (%)    Platelets 209  150 - 400 (K/uL)   CBC     Status: Abnormal   Collection Time   01/24/12  7:45 AM      Component Value Range Comment   WBC 3.0 (*) 4.0 - 10.5 (K/uL)    RBC 4.49  3.87 - 5.11 (MIL/uL)    Hemoglobin 12.8  12.0 - 15.0 (g/dL)    HCT  36.3  36.0 - 46.0 (%)    MCV 80.8  78.0 - 100.0 (fL)    MCH 28.5  26.0 - 34.0 (pg)    MCHC 35.3  30.0 - 36.0 (g/dL)    RDW 82.9  56.2 - 13.0 (%)    Platelets 227  150 - 400 (K/uL)     Procedures/Imaging:  Ct Abdomen Pelvis W Contrast  01/20/2012  *RADIOLOGY REPORT*  Clinical Data: Left lower quadrant pain  CT ABDOMEN AND PELVIS WITH CONTRAST  Technique:  Multidetector CT imaging of the abdomen and pelvis was performed following the standard protocol during bolus administration of intravenous contrast.  Contrast: OMNIPAQUE IOHEXOL 300 MG/ML IJ SOLN  Comparison: 03/29/2011  Findings: Acute sigmoid diverticulitis.  Several diverticuli in the sigmoid colon and descending colon are noted.  There is severe wall thickening of a short segment of the sigmoid colon associated with stranding in the adjacent fat.  There is no abscess formation. There is a small amount of extraluminal bowel gas surrounding one of the diverticuli on image 70 compatible with a micro perforation.  Tiny patchy density at the base of the lateral right lower lobe.  Diffuse hepatic steatosis.  Post cholecystectomy.  Spleen, pancreas, adrenal glands, and kidneys are within normal limits. Small para-aortic nodes.  IMPRESSION: Acute sigmoid diverticulitis associated with a small amount of extraluminal bowel gas.  Correlate clinically as for the need for follow-up imaging.  Critical Value/emergent results were called by telephone at the time of interpretation on 01/20/2012  at 2000  hours  to  Dr. Hal Hope, who verbally acknowledged these results.  Original Report Authenticated By: Donavan Burnet, M.D.     Brief Hospital Course: Today's Physical Exam Gen: NAD  HEENT: Moist mucous membranes  CV: Regular rate and rhythm, no murmurs rubs or gallops  PULM: Clear to auscultation bilaterally. No wheezes/rales/rhonchi  ABD: Soft, tender on LLQ, non distended, no rebound (no peritoneal signs) normal bowel sounds  EXT: No edema  Neuro: Alert and oriented x3. No neurologic focalization   51 year old with a history of GERD and several episodes of diverticulitis on immunosuppressants for Sjogren's syndrome/lupus presenting with acute sigmoid diverticulitis and microperforation.   1. Sigmoid diverticulitis with microperforation, Elevated lipase, Hx of diverticulitis, Hx of GERD: Pain under control with initially with IV morphine. Pt with excellent clinical improvement.  -On Gentamycin and Zosyn due to pt's immunosuppressive status , was transitioned to [facility] and Metronidazole. Surgery following with conservative tx and elective resection in 6-8 weeks.   2. Sjogren's Syndrome: She was on immunosuppressants during previous episodes of diverticulitis.  - Dr. Satira Sark clair contacted. He saw pt recently. 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 Pt with good creatinine. He recommends to hold Imuran until this episode of diverticulitis is resolved.   3. HTN ( controlled)  -Continued on  Losartan   4. Asthma ( no SOB, cough, wheezing)  -  Albuterol PRN indicated, not needed during hospitalization.  5. Depression/Fibromyalgia  - Continued on  Depakote   Patient condition at time of discharge/disposition:  Patient is discharge home on stable medical condition.    D. Piloto Rolene Arbour, MD  [facility] 01/24/2012

## 2012-01-24 NOTE — Progress Notes (Signed)
Discharge instructions/Med Rec Sheet reviewed w/ pt. Pt expressed understanding and copies given w/ prescriptions. Pt ambulated off unit in stable condition per her request.

## 2012-01-25 ENCOUNTER — Telehealth: Payer: Self-pay

## 2012-01-25 ENCOUNTER — Other Ambulatory Visit: Payer: Self-pay | Admitting: Family Medicine

## 2012-01-25 DIAGNOSIS — B3731 Acute candidiasis of vulva and vagina: Secondary | ICD-10-CM

## 2012-01-25 DIAGNOSIS — B373 Candidiasis of vulva and vagina: Secondary | ICD-10-CM

## 2012-01-25 MED ORDER — FLUCONAZOLE 150 MG PO TABS: ORAL_TABLET | ORAL | Status: AC

## 2012-01-25 NOTE — Telephone Encounter (Signed)
Pt needs something for thrush and would like something called in to [facility] and would like to talk with dr Hal Hope soon -if she feels comfortable calling something in or does she need to call the surgeon

## 2012-01-25 NOTE — Telephone Encounter (Signed)
Pls advise.  

## 2012-01-25 NOTE — Telephone Encounter (Signed)
Spoke with patient.  Discharged home after hospitalization for diverticulitis with microperforation. Needs Rx for Diflucan secondary to antibiotics.

## 2012-01-31 ENCOUNTER — Encounter (INDEPENDENT_AMBULATORY_CARE_PROVIDER_SITE_OTHER): Payer: Self-pay | Admitting: Surgery

## 2012-01-31 ENCOUNTER — Ambulatory Visit (INDEPENDENT_AMBULATORY_CARE_PROVIDER_SITE_OTHER): Payer: Medicare Other | Admitting: Surgery

## 2012-01-31 VITALS — BP 122/86 | HR 88 | Temp 98.3°F | Resp 18 | Ht 65.0 in | Wt 205.8 lb

## 2012-01-31 DIAGNOSIS — K5732 Diverticulitis of large intestine without perforation or abscess without bleeding: Secondary | ICD-10-CM

## 2012-01-31 DIAGNOSIS — K5792 Diverticulitis of intestine, part unspecified, without perforation or abscess without bleeding: Secondary | ICD-10-CM

## 2012-01-31 MED ORDER — FLUCONAZOLE 100 MG PO TABS: 100.0000 mg | ORAL_TABLET | Freq: Every day | ORAL | Status: AC

## 2012-01-31 MED ORDER — AMOXICILLIN-POT CLAVULANATE 875-125 MG PO TABS: 1.0000 | ORAL_TABLET | Freq: Two times a day (BID) | ORAL | Status: AC

## 2012-01-31 NOTE — Progress Notes (Signed)
Subjective:     Patient ID: Abigail [name], female   DOB: 02-02-1961, 51 y.o.   MRN: [id number]  HPI Patient returns after being seen in the hospital last week for acute diverticulitis with microperforation. She is slowly improving. She still has some irregularity with her bowel movements. Her pain seems well controlled. She has no nausea or vomiting.  Review of Systems  Constitutional: Positive for activity change and appetite change.  HENT: Negative.   Gastrointestinal: Positive for abdominal pain, constipation and blood in stool.  Genitourinary: Negative.   Musculoskeletal: Positive for arthralgias.       Objective:   Physical Exam  Constitutional: She appears well-developed and well-nourished.  HENT:  Head: Normocephalic and atraumatic.  Eyes: EOM are normal. Pupils are equal, round, and reactive to light.  Abdominal: Soft. Bowel sounds are normal. She exhibits no distension. There is no tenderness.       Assessment:     Acute diverticulitis resolving    Plan:     Return in one month to schedule surgery. She has had multiple attacks think she would benefit from sigmoid colectomy. She has lupus and other immune issues that we will watch closely. She will need a barium enema next month. I will refill Augmentin and Diflucan prescriptions.

## 2012-01-31 NOTE — Patient Instructions (Signed)
Stop flagyl.  Will return next month.  I will set up barium enema next month.  Low fiber diet.

## 2012-02-03 ENCOUNTER — Telehealth: Payer: Self-pay | Admitting: Internal Medicine

## 2012-02-03 ENCOUNTER — Telehealth (INDEPENDENT_AMBULATORY_CARE_PROVIDER_SITE_OTHER): Payer: Self-pay

## 2012-02-03 ENCOUNTER — Other Ambulatory Visit (INDEPENDENT_AMBULATORY_CARE_PROVIDER_SITE_OTHER): Payer: Self-pay

## 2012-02-03 DIAGNOSIS — K5792 Diverticulitis of intestine, part unspecified, without perforation or abscess without bleeding: Secondary | ICD-10-CM

## 2012-02-03 NOTE — Telephone Encounter (Signed)
Patient would like to see Dr. Juanda Chance to discuss her pending surgery. She is scheduled on 03/03/12 but would like to see her earlier if possible. Scheduled patient on 02/18/12 at 9:00 AM.

## 2012-02-03 NOTE — Telephone Encounter (Signed)
error 

## 2012-02-04 ENCOUNTER — Encounter: Payer: Self-pay | Admitting: *Deleted

## 2012-02-04 ENCOUNTER — Telehealth (INDEPENDENT_AMBULATORY_CARE_PROVIDER_SITE_OTHER): Payer: Self-pay | Admitting: General Surgery

## 2012-02-04 NOTE — Telephone Encounter (Signed)
Spoke to patient after speaking to Dr. Luisa Hart. Advised patient if symptoms do not get better she needs to go to ER and get worked up and on call/DOW doctor would possibly evaluate and consider surgery. He cannot electively do her surgery in next 2-3 weeks due to his vacation and schedule so if she feels she cannot wait for her next appointment with [facility] to schedule surgery then she will need to go to ER. She said she will see how she is feeling in next hour or two and make decision on if she will go to ER or not.

## 2012-02-04 NOTE — Telephone Encounter (Signed)
OK 

## 2012-02-04 NOTE — Telephone Encounter (Signed)
Pt calling in again to add that she is having pain and is frightened.  Will update Michelle and Dr. Luisa Hart.

## 2012-02-04 NOTE — Telephone Encounter (Signed)
Pt asking for Marcelino Duster, as she has been talking with her the last few days.  She wants Marcelino Duster to know that she has gone home from work sick with a fever (highest so far 99.5 F)  She was recently in the hospital with a bowel perforation, and they took her off the Immuran at that time.  She wanted Marcelino Duster and Dr. Luisa Hart to know that she is now febrile.

## 2012-02-06 ENCOUNTER — Telehealth: Payer: Self-pay

## 2012-02-06 NOTE — Telephone Encounter (Signed)
Last office notes (01/20/12) printed and ready for pickup. Patient will get discharge summary from [facility].

## 2012-02-06 NOTE — Telephone Encounter (Signed)
Pt [facility] employee) requesting copy of her last OV notes and copy of discharge summary from [facility] on 01/24/12

## 2012-02-17 ENCOUNTER — Ambulatory Visit (INDEPENDENT_AMBULATORY_CARE_PROVIDER_SITE_OTHER): Payer: Medicare Other | Admitting: Surgery

## 2012-02-18 ENCOUNTER — Telehealth: Payer: Self-pay | Admitting: Internal Medicine

## 2012-02-18 ENCOUNTER — Ambulatory Visit: Payer: Medicare Other | Admitting: Internal Medicine

## 2012-02-18 NOTE — Telephone Encounter (Signed)
Patient wanted to let Dr. Juanda Chance know she did not keep her appointment with her this week because she has just gotten out of [facility] hospital. She is going to go back to [facility] to have her colectomy.

## 2012-02-18 NOTE — Telephone Encounter (Signed)
OK 

## 2012-02-18 NOTE — Telephone Encounter (Signed)
Left a message with patients family to have her call me.

## 2012-02-21 ENCOUNTER — Other Ambulatory Visit (HOSPITAL_COMMUNITY): Payer: Medicare Other

## 2012-02-23 ENCOUNTER — Ambulatory Visit (INDEPENDENT_AMBULATORY_CARE_PROVIDER_SITE_OTHER): Payer: Medicare Other | Admitting: Family Medicine

## 2012-02-23 VITALS — BP 119/80 | HR 106 | Temp 98.4°F | Resp 18

## 2012-02-23 DIAGNOSIS — F988 Other specified behavioral and emotional disorders with onset usually occurring in childhood and adolescence: Secondary | ICD-10-CM

## 2012-02-23 DIAGNOSIS — F411 Generalized anxiety disorder: Secondary | ICD-10-CM

## 2012-02-23 DIAGNOSIS — I10 Essential (primary) hypertension: Secondary | ICD-10-CM

## 2012-02-23 DIAGNOSIS — M3500 Sicca syndrome, unspecified: Secondary | ICD-10-CM

## 2012-02-23 DIAGNOSIS — K5730 Diverticulosis of large intestine without perforation or abscess without bleeding: Secondary | ICD-10-CM

## 2012-02-23 DIAGNOSIS — K5792 Diverticulitis of intestine, part unspecified, without perforation or abscess without bleeding: Secondary | ICD-10-CM

## 2012-02-23 DIAGNOSIS — F32A Depression, unspecified: Secondary | ICD-10-CM

## 2012-02-23 DIAGNOSIS — F329 Major depressive disorder, single episode, unspecified: Secondary | ICD-10-CM

## 2012-02-23 MED ORDER — ZOLPIDEM TARTRATE 5 MG PO TABS: 5.0000 mg | ORAL_TABLET | Freq: Every evening | ORAL | Status: DC | PRN

## 2012-02-23 MED ORDER — TRETINOIN 0.1 % EX CREA: TOPICAL_CREAM | Freq: Every day | CUTANEOUS | Status: AC

## 2012-02-23 MED ORDER — SERTRALINE HCL 100 MG PO TABS: 100.0000 mg | ORAL_TABLET | Freq: Every day | ORAL | Status: DC

## 2012-02-23 MED ORDER — POTASSIUM CHLORIDE CRYS ER 10 MEQ PO TBCR: 10.0000 meq | EXTENDED_RELEASE_TABLET | Freq: Every day | ORAL | Status: DC

## 2012-02-23 MED ORDER — DIVALPROEX SODIUM 500 MG PO DR TAB: 500.0000 mg | DELAYED_RELEASE_TABLET | Freq: Every day | ORAL | Status: DC

## 2012-02-23 MED ORDER — SERTRALINE HCL 25 MG PO TABS: ORAL_TABLET | ORAL | Status: DC

## 2012-02-23 MED ORDER — LOSARTAN POTASSIUM 100 MG PO TABS: 100.0000 mg | ORAL_TABLET | Freq: Every day | ORAL | Status: DC

## 2012-02-23 MED ORDER — LORAZEPAM 1 MG PO TABS: ORAL_TABLET | ORAL | Status: DC

## 2012-02-23 MED ORDER — AZELASTINE HCL 0.1 % NA SOLN: 2.0000 | Freq: Every day | NASAL | Status: DC | PRN

## 2012-02-23 MED ORDER — AMPHETAMINE-DEXTROAMPHETAMINE 5 MG PO TABS: 5.0000 mg | ORAL_TABLET | Freq: Every day | ORAL | Status: DC

## 2012-02-23 NOTE — Progress Notes (Signed)
  Subjective:    Patient ID: Abigail [name], female    DOB: 01-25-1961, 51 y.o.   MRN: [id number]  HPI  Recurrent diverticulitis; re admitted [facility].  Plan is for partial colectomy 04/01/12(Dr. Abigail Miyamoto)  Here for medication refills.    Review of Systems     Objective:   Physical Exam  Constitutional: She appears well-developed.  Neck: Neck supple. No thyromegaly present.  Cardiovascular: Normal rate, regular rhythm and normal heart sounds.   Pulmonary/Chest: Effort normal and breath sounds normal.  Abdominal: Soft. There is tenderness (RLQ). There is no rebound and no guarding.  Neurological: She is alert.  Skin: Skin is warm.          Assessment & Plan:   1. Diverticulitis   2. Depression   3. HTN (hypertension)   4. ADD (attention deficit disorder)   5. Sjogren's disease    See medications on AVS Anticipatory guidance

## 2012-02-24 ENCOUNTER — Encounter (INDEPENDENT_AMBULATORY_CARE_PROVIDER_SITE_OTHER): Payer: Medicare Other | Admitting: Surgery

## 2012-02-24 ENCOUNTER — Other Ambulatory Visit: Payer: Medicare Other

## 2012-02-26 ENCOUNTER — Ambulatory Visit (INDEPENDENT_AMBULATORY_CARE_PROVIDER_SITE_OTHER): Payer: Medicare Other | Admitting: Internal Medicine

## 2012-02-26 ENCOUNTER — Encounter: Payer: Self-pay | Admitting: Internal Medicine

## 2012-02-26 VITALS — BP 150/100 | HR 100 | Temp 98.3°F | Resp 22

## 2012-02-26 DIAGNOSIS — G4700 Insomnia, unspecified: Secondary | ICD-10-CM | POA: Insufficient documentation

## 2012-02-26 DIAGNOSIS — F418 Other specified anxiety disorders: Secondary | ICD-10-CM | POA: Insufficient documentation

## 2012-02-26 DIAGNOSIS — F988 Other specified behavioral and emotional disorders with onset usually occurring in childhood and adolescence: Secondary | ICD-10-CM

## 2012-02-26 MED ORDER — AMPHETAMINE-DEXTROAMPHETAMINE 5 MG PO TABS: 5.0000 mg | ORAL_TABLET | Freq: Three times a day (TID) | ORAL | Status: DC

## 2012-02-26 MED ORDER — ZOLPIDEM TARTRATE 10 MG PO TABS: 10.0000 mg | ORAL_TABLET | Freq: Every evening | ORAL | Status: DC | PRN

## 2012-02-26 NOTE — Progress Notes (Signed)
  Subjective:    Patient ID: Abigail [name], female    DOB: [birth year], 51 y.o.   MRN: [id number]  HPI Patient Active Problem List  Diagnoses  . GERD  . DIVERTICULITIS, COLON  . SJOGREN'S SYNDROME  . CHOLECYSTECTOMY, HX OF  . Reactive airways  . Diverticulitis of large intestine with perforation  . ADD (attention deficit disorder)  . Depression with anxiety  . Insomnia  Here for followup for ADD Current medications Adderall 5 mg 2 or 3 times a day depending on schedule She has new educational commitment and has needed more medication recently She has recently started back on Zoloft By Dr. Hal Hope as symptoms have emerged during this time of great stress when she is facing abdominal surgery for recurrent diverticulitis. This is scheduled at [facility] in the near future/Her surgical referral was arranged by her rheumatologist at [facility]     Review of SystemsStable at this point/Her problem list should include hypertension     Objective:   Physical ExamBlood pressure elevated HEENT clear Neurological intact        Assessment & Plan:  ADD  Adderall 5 mg 3 times a day #90 for next 3 months

## 2012-02-27 DIAGNOSIS — I10 Essential (primary) hypertension: Secondary | ICD-10-CM | POA: Insufficient documentation

## 2012-02-29 DIAGNOSIS — I10 Essential (primary) hypertension: Secondary | ICD-10-CM | POA: Insufficient documentation

## 2012-02-29 DIAGNOSIS — F32A Depression, unspecified: Secondary | ICD-10-CM | POA: Insufficient documentation

## 2012-02-29 DIAGNOSIS — F329 Major depressive disorder, single episode, unspecified: Secondary | ICD-10-CM | POA: Insufficient documentation

## 2012-03-03 ENCOUNTER — Ambulatory Visit: Payer: Medicare Other | Admitting: Internal Medicine

## 2012-03-09 ENCOUNTER — Other Ambulatory Visit (HOSPITAL_COMMUNITY): Payer: Medicare Other

## 2012-03-11 HISTORY — PX: SIGMOIDECTOMY: SHX176

## 2012-05-04 ENCOUNTER — Telehealth: Payer: Self-pay | Admitting: *Deleted

## 2012-05-04 ENCOUNTER — Other Ambulatory Visit: Payer: Self-pay | Admitting: Physician Assistant

## 2012-05-04 ENCOUNTER — Ambulatory Visit (INDEPENDENT_AMBULATORY_CARE_PROVIDER_SITE_OTHER): Payer: Medicare Other | Admitting: Physician Assistant

## 2012-05-04 VITALS — BP 121/81 | HR 86 | Temp 98.5°F | Resp 16 | Ht 65.5 in | Wt 200.4 lb

## 2012-05-04 DIAGNOSIS — R5383 Other fatigue: Secondary | ICD-10-CM

## 2012-05-04 DIAGNOSIS — R5381 Other malaise: Secondary | ICD-10-CM

## 2012-05-04 DIAGNOSIS — M329 Systemic lupus erythematosus, unspecified: Secondary | ICD-10-CM

## 2012-05-04 LAB — POCT CBC
Granulocyte percent: 53.7 %G (ref 37–80)
HCT, POC: 37.3 % — AB (ref 37.7–47.9)
Hemoglobin: 11.8 g/dL — AB (ref 12.2–16.2)
Lymph, poc: 1.5 (ref 0.6–3.4)
MCH, POC: 26.6 pg — AB (ref 27–31.2)
MCHC: 31.6 g/dL — AB (ref 31.8–35.4)
MCV: 84.1 fL (ref 80–97)
MID (cbc): 0.4 (ref 0–0.9)
MPV: 10.1 fL (ref 0–99.8)
POC Granulocyte: 2.2 (ref 2–6.9)
POC LYMPH PERCENT: 36.2 %L (ref 10–50)
POC MID %: 10.1 %M (ref 0–12)
Platelet Count, POC: 244 10*3/uL (ref 142–424)
RBC: 4.43 M/uL (ref 4.04–5.48)
RDW, POC: 14.8 %
WBC: 4.1 10*3/uL — AB (ref 4.6–10.2)

## 2012-05-04 LAB — COMPREHENSIVE METABOLIC PANEL
ALT: 56 U/L — ABNORMAL HIGH (ref 0–35)
AST: 59 U/L — ABNORMAL HIGH (ref 0–37)
Albumin: 4.1 g/dL (ref 3.5–5.2)
Alkaline Phosphatase: 85 U/L (ref 39–117)
BUN: 10 mg/dL (ref 6–23)
CO2: 26 mEq/L (ref 19–32)
Calcium: 9.1 mg/dL (ref 8.4–10.5)
Chloride: 107 mEq/L (ref 96–112)
Creat: 0.75 mg/dL (ref 0.50–1.10)
Glucose, Bld: 93 mg/dL (ref 70–99)
Potassium: 4.2 mEq/L (ref 3.5–5.3)
Sodium: 140 mEq/L (ref 135–145)
Total Bilirubin: 0.3 mg/dL (ref 0.3–1.2)
Total Protein: 7.1 g/dL (ref 6.0–8.3)

## 2012-05-04 LAB — TSH: TSH: 1.986 u[IU]/mL (ref 0.350–4.500)

## 2012-05-04 LAB — POCT SEDIMENTATION RATE: POCT SED RATE: 35 mm/hr — AB (ref 0–22)

## 2012-05-04 NOTE — Telephone Encounter (Signed)
Per Rhoderick Moody, called [facility] and added-on TSH at 3:30 pm. Abigail [name], Marion Downer

## 2012-05-04 NOTE — Telephone Encounter (Signed)
Pt would like to know if we can add on a thyroid test?

## 2012-05-04 NOTE — Progress Notes (Signed)
  Subjective:    Patient ID: Abigail [name], female    DOB: 10-14-1961, 51 y.o.   MRN: [id number]  HPI Patient presents with 2 days of fatigue, lightheadedness, and 4 pound weight gain. She is concerned that she may be having a lupus flare and wants to have her potassium and sed rate checked. She is 1 month s/p cervical spine surgery and several months s/p sigmoidectomy. She is recovering well but has felt bad for the past 2 days. Was seen by her rheumatologist while in the hospital 1 month ago and is scheduled for a follow up at the end of July. She re-started her azathioprine 4 days ago. She also complains of achy joints, morning stiffness, and 1 episode of nausea last night.     Review of Systems  Constitutional: Positive for unexpected weight change (4 lb wight gain in 2 days). Negative for fever and chills.  Cardiovascular: Negative for chest pain.  Gastrointestinal: Positive for nausea. Negative for vomiting and abdominal pain.  Skin: Negative for rash.  Neurological: Positive for light-headedness.       Objective:   Physical Exam  Constitutional: She is oriented to person, place, and time. She appears well-developed and well-nourished.  HENT:  Head: Normocephalic and atraumatic.  Right Ear: Hearing, tympanic membrane, external ear and ear canal normal.  Left Ear: Hearing, tympanic membrane, external ear and ear canal normal.  Mouth/Throat: Uvula is midline, oropharynx is clear and moist and mucous membranes are normal. No oropharyngeal exudate.  Eyes: Conjunctivae are normal.  Neck: Normal range of motion.  Cardiovascular: Normal rate, regular rhythm and normal heart sounds.   Pulmonary/Chest: Effort normal and breath sounds normal.  Abdominal: Soft. Bowel sounds are normal.  Lymphadenopathy:    She has no cervical adenopathy.  Neurological: She is alert and oriented to person, place, and time.  Skin: Skin is warm and dry.     Psychiatric: She has a normal mood and affect.  Judgment and thought content normal.    Results for orders placed in visit on 05/04/12  POCT CBC      Component Value Range   WBC 4.1 (*) 4.6 - 10.2 K/uL   Lymph, poc 1.5  0.6 - 3.4   POC LYMPH PERCENT 36.2  10 - 50 %L   MID (cbc) 0.4  0 - 0.9   POC MID % 10.1  0 - 12 %M   POC Granulocyte 2.2  2 - 6.9   Granulocyte percent 53.7  37 - 80 %G   RBC 4.43  4.04 - 5.48 M/uL   Hemoglobin 11.8 (*) 12.2 - 16.2 g/dL   HCT, POC 40.9 (*) 81.1 - 47.9 %   MCV 84.1  80 - 97 fL   MCH, POC 26.6 (*) 27 - 31.2 pg   MCHC 31.6 (*) 31.8 - 35.4 g/dL   RDW, POC 91.4     Platelet Count, POC 244  142 - 424 K/uL   MPV 10.1  0 - 99.8 fL  POCT SEDIMENTATION RATE      Component Value Range   POCT SED RATE 35 (*) 0 - 22 mm/hr         Assessment & Plan:   1. Lupus  Will await results of CMET.  Based on sed rate this does not appear to be an acute flare of lupus. Monitor closely to ensure improvement of symptoms.  POCT SEDIMENTATION RATE  2. Fatigue  POCT CBC, Comprehensive metabolic panel

## 2012-05-04 NOTE — Addendum Note (Signed)
Addended by: Nelva Nay on: 05/04/2012 03:25 PM   Modules accepted: Orders

## 2012-05-04 NOTE — Telephone Encounter (Signed)
Done

## 2012-05-24 ENCOUNTER — Ambulatory Visit (INDEPENDENT_AMBULATORY_CARE_PROVIDER_SITE_OTHER): Payer: Medicare Other | Admitting: Physician Assistant

## 2012-05-24 ENCOUNTER — Encounter: Payer: Self-pay | Admitting: Physician Assistant

## 2012-05-24 VITALS — BP 137/82 | HR 72 | Temp 98.2°F | Resp 16 | Wt 197.4 lb

## 2012-05-24 DIAGNOSIS — H109 Unspecified conjunctivitis: Secondary | ICD-10-CM

## 2012-05-24 DIAGNOSIS — H1089 Other conjunctivitis: Secondary | ICD-10-CM

## 2012-05-24 MED ORDER — MOXIFLOXACIN HCL 0.5 % OP SOLN: 1.0000 [drp] | Freq: Three times a day (TID) | OPHTHALMIC | Status: AC

## 2012-05-24 NOTE — Patient Instructions (Signed)
Clean ALL the surfaces in your home and car.Marland KitchenMarland KitchenI like the Chlorox wipes.

## 2012-05-24 NOTE — Progress Notes (Signed)
Subjective:    Patient ID: Abigail [name], female    DOB: 12/17/1960, 51 y.o.   MRN: [id number]  HPI This 50 y.o. Female presents for evaluation of pink eye.  She took care of an infant last week with green drainage from both eyes.  Then 5 days ago developed left eye irritation, drainage, and redness.  The drainage is now green and thick.  She denies decreased visual acuity, photophobia, dizziness, nausea.  No URI-type symptoms.   Past Medical History  Diagnosis Date  . Hypertension   . Asthma   . Lupus   . Migraine   . GERD (gastroesophageal reflux disease)   . Melanoma   . Complication of anesthesia     Sjogrens Syndrome  . Sjogren's syndrome   . Shortness of breath     "related to the asthma"  . Vasculitis     "autoimmune"  . Diverticulitis   . Gastritis   . Duodenitis   . Pancreatitis   . Depression   . Fibromyalgia   . Perforation bowel     "microperforation"   Prior to Admission medications   Medication Sig Start Date End Date Taking? Authorizing Provider  albuterol (PROVENTIL HFA;VENTOLIN HFA) 108 (90 BASE) MCG/ACT inhaler Inhale 2 puffs into the lungs every 6 (six) hours as needed. For shortness of breath. 01/10/12  Yes Hettie Roselli S Rilla Buckman, PA-C  azaTHIOprine (IMURAN) 50 MG tablet  01/05/12  Yes Historical Provider, MD  azelastine (ASTELIN) 137 MCG/SPRAY nasal spray Place 2 sprays into the nose daily as needed. For stuffy nose. 02/23/12  Yes Dois Davenport, MD  divalproex (DEPAKOTE) 500 MG DR tablet Take 1 tablet (500 mg total) by mouth daily. 02/23/12  Yes Dois Davenport, MD  LORazepam (ATIVAN) 1 MG tablet 1 twice daily as needed 02/23/12  Yes Dois Davenport, MD  losartan (COZAAR) 100 MG tablet Take 1 tablet (100 mg total) by mouth daily. 02/23/12 02/22/13 Yes Dois Davenport, MD  potassium chloride SA (K-DUR,KLOR-CON) 10 MEQ tablet Take 1 tablet (10 mEq total) by mouth daily. 02/23/12 02/22/13 Yes Dois Davenport, MD  ranitidine (ZANTAC) 75 MG tablet Take 75 mg by mouth  daily.     Yes Historical Provider, MD  sertraline (ZOLOFT) 100 MG tablet Take 1 tablet (100 mg total) by mouth daily. 02/23/12 02/22/13 Yes Dois Davenport, MD  sertraline (ZOLOFT) 25 MG tablet 1/2 daily for 2 weeks; then 1 daily for 2 weeks then 2 daily 02/23/12  Yes Dois Davenport, MD  tretinoin (RETIN-A) 0.1 % cream Apply topically at bedtime. 02/23/12 02/22/13 Yes Dois Davenport, MD  zolpidem (AMBIEN) 10 MG tablet Take 1 tablet (10 mg total) by mouth at bedtime as needed for sleep. 02/26/12 02/25/13 Yes Tonye Pearson, MD  amphetamine-dextroamphetamine (ADDERALL, 5MG ,) 5 MG tablet Take 1 tablet (5 mg total) by mouth 3 (three) times daily. 02/26/12 03/27/12  Tonye Pearson, MD    Allergies  Allergen Reactions  . Meperidine Hcl Other (See Comments)    tachycardia  . Doxycycline Hives  . Latex Other (See Comments)    Breathing problems.  . Promethazine Hcl Other (See Comments)    "uncontrolled vomiting"  . Sulfonamide Derivatives Hives  . Tetracyclines & Related Rash    History   Social History  . Marital Status: Married    Number of Children: 2   Social History Main Topics  . Smoking status: Never Smoker   . Smokeless tobacco: Never Used  . Alcohol  Use: Yes     01/20/12 once/month  . Drug Use: No  . Sexually Active: Yes   Family History  Problem Relation Age of Onset  . Colon cancer Paternal Uncle   . Colon polyps Sister   . Colon polyps Brother   . Colon polyps Mother    Review of Systems As above.    Objective:   Physical Exam  Constitutional: She is oriented to person, place, and time. Vital signs are normal. She appears well-developed and well-nourished. She is active. No distress.  HENT:  Head: Normocephalic and atraumatic.  Right Ear: External ear normal.  Left Ear: External ear normal.  Nose: Nose normal.  Eyes: EOM and lids are normal. Pupils are equal, round, and reactive to light. Right eye exhibits no discharge. Left eye exhibits no discharge. Right  conjunctiva is not injected. Right conjunctiva has no hemorrhage. Left conjunctiva is injected (mildly). Left conjunctiva has no hemorrhage. No scleral icterus.  Fundoscopic exam:      The right eye shows no arteriolar narrowing, no AV nicking, no exudate, no hemorrhage and no papilledema. The right eye shows red reflex.The right eye shows no venous pulsations.      The left eye shows no arteriolar narrowing, no AV nicking, no exudate, no hemorrhage and no papilledema. The left eye shows red reflex.The left eye shows no venous pulsations. Neck: Neck supple. No thyromegaly present.  Cardiovascular: Normal rate.   Pulmonary/Chest: Effort normal.  Neurological: She is alert and oriented to person, place, and time.  Skin: Skin is warm and dry.  Psychiatric: She has a normal mood and affect.      Assessment & Plan:   1. Conjunctivitis, bacterial  moxifloxacin (VIGAMOX) 0.5 % ophthalmic solution   Patient Instructions  Clean ALL the surfaces in your home and car.Marland KitchenMarland KitchenI like the Chlorox wipes.

## 2012-05-28 ENCOUNTER — Telehealth: Payer: Self-pay

## 2012-05-28 MED ORDER — MOXIFLOXACIN HCL 0.5 % OP SOLN: 1.0000 [drp] | Freq: Three times a day (TID) | OPHTHALMIC | Status: AC

## 2012-05-28 NOTE — Telephone Encounter (Signed)
Perhaps they mean ocuflox? If so, ok to send in ocuflox 1-2 gtt q 2-4 hours for 2 days, then 1-2 gtt qid for 5 days. #1 no RF

## 2012-05-28 NOTE — Telephone Encounter (Signed)
Done and sent to pharmacy. 

## 2012-05-28 NOTE — Telephone Encounter (Signed)
Olegario Messier from [facility] is calling to see if we could send over an rx for aquamox instead of the vigamox. Best# [phone]

## 2012-05-28 NOTE — Telephone Encounter (Signed)
Patient lost Vigamox eye drops from last visit. Can you please call in a new prescription to [facility].

## 2012-05-29 ENCOUNTER — Telehealth: Payer: Self-pay | Admitting: Family Medicine

## 2012-05-29 NOTE — Telephone Encounter (Signed)
Spoke with patient advised her to pick RX up

## 2012-07-15 ENCOUNTER — Ambulatory Visit (INDEPENDENT_AMBULATORY_CARE_PROVIDER_SITE_OTHER): Payer: Medicare Other | Admitting: Physician Assistant

## 2012-07-15 VITALS — BP 128/88 | HR 94 | Temp 98.5°F | Resp 16

## 2012-07-15 DIAGNOSIS — R209 Unspecified disturbances of skin sensation: Secondary | ICD-10-CM

## 2012-07-15 DIAGNOSIS — M545 Low back pain, unspecified: Secondary | ICD-10-CM

## 2012-07-15 DIAGNOSIS — R202 Paresthesia of skin: Secondary | ICD-10-CM

## 2012-07-15 DIAGNOSIS — R21 Rash and other nonspecific skin eruption: Secondary | ICD-10-CM

## 2012-07-15 LAB — THYROID PANEL WITH TSH
Free Thyroxine Index: 3.2 (ref 1.0–3.9)
T3 Uptake: 26.6 % (ref 22.5–37.0)
T4, Total: 12.2 ug/dL (ref 5.0–12.5)
TSH: 1.925 u[IU]/mL (ref 0.350–4.500)

## 2012-07-15 NOTE — Progress Notes (Signed)
  Subjective:    Patient ID: Abigail [name], female    DOB: [birth year], 51 y.o.   MRN: [id number]  HPI 51 year old female presents with 2 concerns. She would like to have 2 places on her back biopsied.  She has a history of melanoma and atypical nevus and so would like to have these 2 places removed and sent for biopsy.  Complains that they have been itching and bothering her for "a few weeks."  Also complains of a feeling in both hands like they are swelling but they are not actually swollen. No pain, weakness, or other complaints.  She is here because she would like to have thyroid antibodies checked.      Review of Systems  All other systems reviewed and are negative.       Objective:   Physical Exam  Constitutional: She is oriented to person, place, and time. She appears well-developed and well-nourished.  HENT:  Head: Normocephalic and atraumatic.  Right Ear: External ear normal.  Left Ear: External ear normal.  Eyes: Conjunctivae are normal.  Neck: Normal range of motion.  Cardiovascular: Normal rate.   Pulmonary/Chest: Effort normal.  Neurological: She is alert and oriented to person, place, and time.  Skin:     Psychiatric: She has a normal mood and affect. Her behavior is normal. Judgment and thought content normal.    Shave biopsy obtained and sent for pathology. Hemostasis obtained with drysol. Patient tolerated procedure well.       Assessment & Plan:   1. Paresthesias  Thyroid antibodies, Thyroid Panel With TSH  2. Rash and nonspecific skin eruption  Dermatology pathology  Will await labs and pathology to determine treatment plan. Follow up as needed.

## 2012-07-16 LAB — THYROID ANTIBODIES
Thyroglobulin Ab: <20 U/mL (ref <40.0)
Thyroperoxidase Ab SerPl-aCnc: <10 IU/mL (ref <35.0)

## 2012-08-03 ENCOUNTER — Encounter: Payer: Medicare Other | Admitting: Family Medicine

## 2012-08-04 ENCOUNTER — Other Ambulatory Visit: Payer: Self-pay | Admitting: Dermatology

## 2012-08-04 DIAGNOSIS — D239 Other benign neoplasm of skin, unspecified: Secondary | ICD-10-CM | POA: Insufficient documentation

## 2012-08-10 ENCOUNTER — Encounter: Payer: Medicare Other | Admitting: Family Medicine

## 2012-08-17 ENCOUNTER — Ambulatory Visit (INDEPENDENT_AMBULATORY_CARE_PROVIDER_SITE_OTHER): Payer: Medicare Other | Admitting: Family Medicine

## 2012-08-17 ENCOUNTER — Encounter: Payer: Self-pay | Admitting: Family Medicine

## 2012-08-17 VITALS — BP 133/92 | HR 82 | Temp 97.7°F | Resp 16

## 2012-08-17 DIAGNOSIS — N951 Menopausal and female climacteric states: Secondary | ICD-10-CM

## 2012-08-17 DIAGNOSIS — M329 Systemic lupus erythematosus, unspecified: Secondary | ICD-10-CM

## 2012-08-17 DIAGNOSIS — K769 Liver disease, unspecified: Secondary | ICD-10-CM

## 2012-08-17 DIAGNOSIS — I10 Essential (primary) hypertension: Secondary | ICD-10-CM

## 2012-08-17 DIAGNOSIS — R232 Flushing: Secondary | ICD-10-CM

## 2012-08-17 DIAGNOSIS — R945 Abnormal results of liver function studies: Secondary | ICD-10-CM

## 2012-08-17 MED ORDER — ESTRADIOL 1 MG PO TABS: 1.0000 mg | ORAL_TABLET | Freq: Every day | ORAL | Status: DC

## 2012-08-17 NOTE — Patient Instructions (Addendum)
1. Hot flashes  estradiol (ESTRACE) 1 MG tablet  2. SLE (systemic lupus erythematosus)    3. Liver function abnormality    4. Essential hypertension, benign

## 2012-08-17 NOTE — Progress Notes (Signed)
[address]   [place], [place]  [address]   [phone]  Subjective:    Patient ID: Abigail [name], female    DOB: 09-Oct-1961, 51 y.o.   MRN: [id number]  HPIThis 51 y.o. female presents to establish care.  Previous PCP Abigail [name], Abigail [name] in past year; [employer] has been PCP since 1980s.  Abigail [name]'s sister.  Resigned from [employer]; worked at [employer] x 3 years.    Last physical unknown.   Pap smear 2012 no cervix; hysterectomy 2004.   Mammogram 2012.   Colonoscopy 2010.   TDAP 2009.  Influenza vaccine 08/2012.   Zostavax never; currently taking immunosuppressant.   Pneumovax x 2 in past several years.   Eye exam 05/2012 [facility].   Dental exam [facility]; 06/2012.    1.  Needs physician/PCP:   previous patient of Dr. Hal [name].  Presenting to establish.  Rheumatology follows every four months; rheumatology wants patient seen by PCP alternating every four months.    2.  SLE:  S/p evaluation by Abigail [name]. 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  Rheumatology wants to wean Imuran due to elevated LFTs.  Pt thinks liver function elevation due to weight.  Stopped Imuran for sigmoidectomy in 2013 and Creatinine increased so patient really wants to continue Imuran.  To be followed every 4 months.  Has been followed by Abigail [name] for years at [facility].  Will need repeat labs in 8 weeks faxed to him.    3. LFTs elevated:  Rheumatology wants to wean Imuran due to elevated LFTs but pt does not want to wean Imuran due to renal benefits.  Patient plans to work on exercise, weight loss.    4.  Gyn exam: followed by Abigail [name]; prescribed Estradiol in 2011 but patient eventually stopped medication.    No family hx of heart disease; no family hx of breast cancer.  Post hysterectomy for fibroids/DUB.  Dying of hot flashes currently.  Up all night long.  Not sure if hot  flashes stress related; pt did recently also weaned Zoloft.  Requesting rx for estradiol or estrogen-testosterone combination until evaluated by gyn in two months.  Recurrent symptoms two weeks ago.   No blood clot history in family.  Gaining weight. Miserable with hot flashes.  Previous Estradiol 1mg  daily.    5. HTN: compliance with Losartan daily; does not check blood pressure at home.  Denies chest pain, palpitations, shortness of breath, leg swelling.    6.  Migraines: followed by Abigail [name] of neurology; has weaned off of Depakote due to elevated LFTs.  7.  Depression: has weaned off of Zoloft; taking Ativan once daily. Using Ambien just as needed.  Trying to simplify medications and not take any medication that is no longer warranted.   Other providers:  Abigail [name]/Neurologist for migraines, Abigail [name] is rheumatology Abigail [name], Abigail [name]/GI, Derm/Abigail [name], Gyn/Abigail [name]  Review of Systems  Constitutional: Negative for fever, chills, diaphoresis, fatigue and unexpected weight change.  Cardiovascular: Negative for chest pain, palpitations and leg swelling.  Musculoskeletal: Positive for joint swelling and arthralgias.  Neurological: Negative for dizziness, tremors, weakness, light-headedness and headaches.  Psychiatric/Behavioral: Negative for dysphoric mood. The patient is not nervous/anxious.         Past Medical History  Diagnosis Date  . Lupus   . Migraine   . Complication of anesthesia     Sjogrens  Syndrome  . Sjogren's syndrome   . Shortness of breath     "related to the asthma"  . Vasculitis     "autoimmune"  . Diverticulitis   . Gastritis   . Duodenitis   . Pancreatitis   . Fibromyalgia   . Perforation bowel     "microperforation"  . Asthma   . Melanoma 2008    Back; Abigail [name].  . Depression   . GERD (gastroesophageal reflux disease)   . Hypertension   . Urticaria     Past Surgical History  Procedure Date  . Melanoma excision   . Tmj arthroplasty 1970  . Appendectomy   .  Cholecystectomy   . Pancreas surgery     sphincterotomy  . Abdominoplasty   . Liver biopsy   . Breast surgery   . Anterior cervical decomp/discectomy fusion 10/29/2011    Procedure: ANTERIOR CERVICAL DECOMPRESSION/DISCECTOMY FUSION 2 LEVELS;  Surgeon: Karn Cassis;  Location: [facility];  Service: Neurosurgery;  Laterality: N/A;  Cervical five-six,Cervical six-seven nterior cervical decompression/diskectomy, fusion, plate  . Sigmoidectomy 03/2012    [facility] at [facility]  . Abdominal hysterectomy     Partial; Ovaries intact.  DUB/fibroids.  . Colonoscopy 11/11/2008    Prior to Admission medications   Medication Sig Start Date End Date Taking? Authorizing Provider  albuterol (PROVENTIL HFA;VENTOLIN HFA) 108 (90 BASE) MCG/ACT inhaler Inhale 2 puffs into the lungs every 6 (six) hours as needed. For shortness of breath. 01/10/12  Yes Chelle S Jeffery, PA-C  amphetamine-dextroamphetamine (ADDERALL) 5 MG tablet Take 5 mg by mouth 3 (three) times daily.   Yes Historical Provider, MD  azaTHIOprine (IMURAN) 50 MG tablet  01/05/12  Yes Historical Provider, MD  azelastine (ASTELIN) 137 MCG/SPRAY nasal spray Place 2 sprays into the nose daily as needed. For stuffy nose. 02/23/12  Yes Dois Davenport, MD  LORazepam (ATIVAN) 1 MG tablet 1 twice daily as needed 02/23/12  Yes Dois Davenport, MD  losartan (COZAAR) 100 MG tablet Take 1 tablet (100 mg total) by mouth daily. 02/23/12 02/22/13 Yes Dois Davenport, MD  tretinoin (RETIN-A) 0.1 % cream Apply topically at bedtime. 02/23/12 02/22/13 Yes Dois Davenport, MD  zolpidem (AMBIEN) 10 MG tablet Take 1 tablet (10 mg total) by mouth at bedtime as needed for sleep. 02/26/12 02/25/13 Yes Tonye Pearson, MD  amphetamine-dextroamphetamine (ADDERALL, 5MG ,) 5 MG tablet Take 1 tablet (5 mg total) by mouth 3 (three) times daily. 02/26/12 03/27/12  Tonye Pearson, MD  divalproex (DEPAKOTE) 500 MG DR tablet Take 1 tablet (500 mg total) by mouth daily. 02/23/12   Dois Davenport,  MD  estradiol (ESTRACE) 1 MG tablet Take 1 tablet (1 mg total) by mouth daily. 08/17/12   Ethelda Chick, MD  potassium chloride SA (K-DUR,KLOR-CON) 10 MEQ tablet Take 1 tablet (10 mEq total) by mouth daily. 02/23/12 02/22/13  Dois Davenport, MD  ranitidine (ZANTAC) 75 MG tablet Take 75 mg by mouth daily.      Historical Provider, MD  sertraline (ZOLOFT) 100 MG tablet Take 1 tablet (100 mg total) by mouth daily. 02/23/12 02/22/13  Dois Davenport, MD  sertraline (ZOLOFT) 25 MG tablet 1/2 daily for 2 weeks; then 1 daily for 2 weeks then 2 daily 02/23/12   Dois Davenport, MD    Allergies  Allergen Reactions  . Meperidine Hcl Other (See Comments)    tachycardia  . Doxycycline Hives  . Latex Other (See Comments)    Breathing  problems.  . Promethazine Hcl Other (See Comments)    "uncontrolled vomiting"  . Sulfonamide Derivatives Hives  . Tetracyclines & Related Rash    History   Social History  . Marital Status: Married    Spouse Name: N/A    Number of Children: N/A  . Years of Education: N/A   Occupational History  . Not on file.   Social History Main Topics  . Smoking status: Never Smoker   . Smokeless tobacco: Never Used  . Alcohol Use: Yes     01/20/12 once/month  . Drug Use: No  . Sexually Active: Yes   Other Topics Concern  . Not on file   Social History Narrative   Marital status: married x 28 years to [place]; happily married; no abuse   Children: 2 children (44 yo son, 43 yo daughter); no grandchildren   Lives with: husband, daughter   Employment:  [employer] 2009-2013.  Resigned 08/2012.  Bachelor degrees in communication.     Tobacco:  None   Alcohol:  Once weekly; wine   Drugs: none   Exercise:  none          Family History  Problem Relation Age of Onset  . Colon cancer Paternal Uncle   . Colon polyps Sister   . Depression Sister     suicide  . Colon polyps Brother   . Colon polyps Mother   . Dementia Mother   . Heart disease Father 58    CHF  . Macular degeneration  Father     Objective:   Physical Exam  Nursing note and vitals reviewed. Constitutional: She is oriented to person, place, and time. She appears well-developed and well-nourished. No distress.  Eyes: Conjunctivae normal and EOM are normal. Pupils are equal, round, and reactive to light.  Neck: Normal range of motion. Neck supple. No thyromegaly present.  Cardiovascular: Normal rate, regular rhythm, normal heart sounds and intact distal pulses.   No murmur heard. Pulmonary/Chest: Effort normal and breath sounds normal.  Lymphadenopathy:    She has no cervical adenopathy.  Neurological: She is alert and oriented to person, place, and time.  Skin: Skin is warm and dry. No rash noted. She is not diaphoretic.  Psychiatric: She has a normal mood and affect. Her behavior is normal. Judgment and thought content normal.       Assessment & Plan:   1. Hot flashes  estradiol (ESTRACE) 1 MG tablet  2. SLE (systemic lupus erythematosus)    3. Liver function abnormality    4. Essential hypertension, benign       1.  SLE:  New to this provider; diagnosed in 2012; followed every four months by [facility]. Abigail [name]/Rheumatology Abigail [name].  Maintained on Imuran.  +renal involvement.  Will warrant repeat labs in 8 weeks.   2.  HTN: controlled; no change in management at this time. 3.  LFTs elevated: persistent.  Rheumatology concerned due to Imuran but patient desires to continue Imuran for renal benefits.  Plans to work on exercise, weight loss.  Pt has also stopped Depakote which is hepatically cleared.  4.  Menopausal hot flashes: new.  Onset two months ago.  Pt reports recent TSH normal.  Rx for Estradiol 1mg  daily provided until follow-up with gynecology.  No family history of early CAD, breast cancer, clotting disorder/DVT/PE.  However, recent diagnosis in past year of SLE; thus warrants antiphospholipid ab testing and also warrants discussion with rheumatology if she is an HRT candidate.  If testing positive,  then not a candidate for HRT.   5.  Depression: improved; has weaned Zoloft. 6.  Migraines: stable; followed by Abigail [name]; has weaned Depakote due to hepatic clearance. 7.  Insomnia: stable; using Ambien sparingly. 8.  ADD: stable; not sure if will warrant daily medication since resigning from current position at [employer].

## 2012-09-04 ENCOUNTER — Ambulatory Visit (INDEPENDENT_AMBULATORY_CARE_PROVIDER_SITE_OTHER): Payer: Medicare Other | Admitting: Family Medicine

## 2012-09-04 VITALS — BP 128/88 | HR 83 | Temp 98.0°F | Resp 18

## 2012-09-04 DIAGNOSIS — J309 Allergic rhinitis, unspecified: Secondary | ICD-10-CM

## 2012-09-04 DIAGNOSIS — G4700 Insomnia, unspecified: Secondary | ICD-10-CM

## 2012-09-04 DIAGNOSIS — R42 Dizziness and giddiness: Secondary | ICD-10-CM

## 2012-09-04 DIAGNOSIS — H9209 Otalgia, unspecified ear: Secondary | ICD-10-CM

## 2012-09-04 DIAGNOSIS — H9201 Otalgia, right ear: Secondary | ICD-10-CM

## 2012-09-04 LAB — COMPREHENSIVE METABOLIC PANEL
ALT: 56 U/L — ABNORMAL HIGH (ref 0–35)
Albumin: 4.3 g/dL (ref 3.5–5.2)
CO2: 22 mEq/L (ref 19–32)
Calcium: 9.3 mg/dL (ref 8.4–10.5)
Chloride: 106 mEq/L (ref 96–112)
Glucose, Bld: 94 mg/dL (ref 70–99)
Potassium: 4.2 mEq/L (ref 3.5–5.3)
Sodium: 137 mEq/L (ref 135–145)
Total Bilirubin: 0.4 mg/dL (ref 0.3–1.2)
Total Protein: 7.3 g/dL (ref 6.0–8.3)

## 2012-09-04 LAB — POCT URINALYSIS DIPSTICK
Bilirubin, UA: NEGATIVE
Blood, UA: NEGATIVE
Nitrite, UA: NEGATIVE
Protein, UA: NEGATIVE
pH, UA: 5.5

## 2012-09-04 LAB — POCT CBC
Granulocyte percent: 60.2 %G (ref 37–80)
HCT, POC: 42.2 % (ref 37.7–47.9)
Hemoglobin: 13.4 g/dL (ref 12.2–16.2)
Lymph, poc: 1.1 (ref 0.6–3.4)
MPV: 9.8 fL (ref 0–99.8)
POC Granulocyte: 2 (ref 2–6.9)
POC MID %: 8 %M (ref 0–12)
RBC: 4.94 M/uL (ref 4.04–5.48)

## 2012-09-04 LAB — POCT UA - MICROSCOPIC ONLY
Casts, Ur, LPF, POC: NEGATIVE
Yeast, UA: NEGATIVE

## 2012-09-04 LAB — TSH: TSH: 2.29 u[IU]/mL (ref 0.350–4.500)

## 2012-09-04 MED ORDER — MECLIZINE HCL 25 MG PO TABS: 25.0000 mg | ORAL_TABLET | Freq: Three times a day (TID) | ORAL | Status: DC | PRN

## 2012-09-04 MED ORDER — LORAZEPAM 1 MG PO TABS: ORAL_TABLET | ORAL | Status: DC

## 2012-09-04 MED ORDER — FLUTICASONE PROPIONATE 50 MCG/ACT NA SUSP: 2.0000 | Freq: Every day | NASAL | Status: DC

## 2012-09-04 NOTE — Progress Notes (Signed)
[address]   [place], [place]  [address]   [phone]  Subjective:    Patient ID: Abigail [name], female    DOB: [birth year], 51 y.o.   MRN: [id number]  HPIThis 51 y.o. female presents for evaluation of vertigo/dizziness.  Acute onset of dizziness this morning upon awakening.  R ear pain onset for past week.  No rhinorrhea, nasal congestion, PND.  No migraine/HA today.  Started when opened eyes this morning.  Room spinning.  Comes in waves; duration of severe dizziness 30-60 seconds.  Will settle down and recur.  +nausea; no vomiting.  +tinnitus R ear.  No hearing loss.  No diplopia, blurred vision.  No syncope.  No numbness, tingling, no focal weakness.  Takes Ativan; completely out of Ativan; missed usual dose last night; curious if dizziness could be from missed dose of Ativan.  Usually takes Ativan 1/2 to 1 mg qhs.  Mother died five days ago; lots of stressors dealing with family members and with grief of loss.    No chest pain, no palpitations, shortness of breath, no leg swelling or pain.  No other skipped medications in past week; no excessive intake of medications.  No worsening alcohol intake; rarely every drinks alcohol.   No rash on skin.  Did have cold sore; took Valtrex this week for first time in ten years.  No history of vertigo.  Turning head or rolling over in bed does not trigger dizziness.      Review of Systems  Constitutional: Negative for fever, chills, diaphoresis and fatigue.  HENT: Positive for ear pain, mouth sores and tinnitus. Negative for hearing loss, congestion, sore throat, rhinorrhea, sneezing, trouble swallowing, neck pain, neck stiffness, dental problem, voice change, postnasal drip, sinus pressure and ear discharge.   Eyes: Negative for photophobia, pain, redness and visual disturbance.  Respiratory: Negative for cough, choking, chest tightness, shortness of breath and wheezing.   Cardiovascular: Negative for chest pain, palpitations and leg swelling.    Gastrointestinal: Positive for nausea. Negative for vomiting, abdominal pain and diarrhea.  Skin: Negative for rash.  Neurological: Positive for dizziness. Negative for tremors, seizures, syncope, facial asymmetry, speech difficulty, weakness, light-headedness, numbness and headaches.        Past Medical History  Diagnosis Date  . Lupus   . Migraine   . Complication of anesthesia     Sjogrens Syndrome  . Sjogren's syndrome   . Shortness of breath     "related to the asthma"  . Vasculitis     "autoimmune"  . Diverticulitis   . Gastritis   . Duodenitis   . Pancreatitis   . Fibromyalgia   . Perforation bowel     "microperforation"  . Asthma   . Melanoma 2008    Back; [place].  . Depression   . GERD (gastroesophageal reflux disease)   . Hypertension   . Urticaria     Past Surgical History  Procedure Date  . Melanoma excision   . Tmj arthroplasty 1970  . Appendectomy   . Cholecystectomy   . Pancreas surgery     sphincterotomy  . Abdominoplasty   . Liver biopsy   . Breast surgery   . Anterior cervical decomp/discectomy fusion 10/29/2011    Procedure: ANTERIOR CERVICAL DECOMPRESSION/DISCECTOMY FUSION 2 LEVELS;  Surgeon: Karn Cassis;  Location: [facility];  Service: Neurosurgery;  Laterality: N/A;  Cervical five-six,Cervical six-seven nterior cervical decompression/diskectomy, fusion, plate  . Sigmoidectomy 03/2012    done at [facility]  .  Abdominal hysterectomy     Partial; Ovaries intact.  DUB/fibroids.  . Colonoscopy 11/11/2008    Prior to Admission medications   Medication Sig Start Date End Date Taking? Authorizing Provider  albuterol (PROVENTIL HFA;VENTOLIN HFA) 108 (90 BASE) MCG/ACT inhaler Inhale 2 puffs into the lungs every 6 (six) hours as needed. For shortness of breath. 01/10/12  Yes Chelle S Jeffery, PA-C  amphetamine-dextroamphetamine (ADDERALL) 5 MG tablet Take 5 mg by mouth 3 (three) times daily.   Yes Historical Provider, MD  azaTHIOprine (IMURAN) 50 MG  tablet  01/05/12  Yes Historical Provider, MD  azelastine (ASTELIN) 137 MCG/SPRAY nasal spray Place 2 sprays into the nose daily as needed. For stuffy nose. 02/23/12  Yes Dois Davenport, MD  divalproex (DEPAKOTE) 500 MG DR tablet Take 1 tablet (500 mg total) by mouth daily. 02/23/12  Yes Dois Davenport, MD  estradiol (ESTRACE) 1 MG tablet Take 1 tablet (1 mg total) by mouth daily. 08/17/12  Yes Ethelda Chick, MD  LORazepam (ATIVAN) 1 MG tablet 1 twice daily as needed 09/04/12  Yes Ethelda Chick, MD  losartan (COZAAR) 100 MG tablet Take 1 tablet (100 mg total) by mouth daily. 02/23/12 02/22/13 Yes Dois Davenport, MD  ranitidine (ZANTAC) 75 MG tablet Take 75 mg by mouth daily.     Yes Historical Provider, MD  tretinoin (RETIN-A) 0.1 % cream Apply topically at bedtime. 02/23/12 02/22/13 Yes Dois Davenport, MD  zolpidem (AMBIEN) 10 MG tablet Take 1 tablet (10 mg total) by mouth at bedtime as needed for sleep. 02/26/12 02/25/13 Yes Tonye Pearson, MD  amphetamine-dextroamphetamine (ADDERALL, 5MG ,) 5 MG tablet Take 1 tablet (5 mg total) by mouth 3 (three) times daily. 02/26/12 03/27/12  Tonye Pearson, MD  fluticasone (FLONASE) 50 MCG/ACT nasal spray Place 2 sprays into the nose daily. 09/04/12   Ethelda Chick, MD  meclizine (ANTIVERT) 25 MG tablet Take 1 tablet (25 mg total) by mouth 3 (three) times daily as needed for dizziness or nausea. 09/04/12   Ethelda Chick, MD    Allergies  Allergen Reactions  . Meperidine Hcl Other (See Comments)    tachycardia  . Doxycycline Hives  . Latex Other (See Comments)    Breathing problems.  . Promethazine Hcl Other (See Comments)    "uncontrolled vomiting"  . Sulfonamide Derivatives Hives  . Tetracyclines & Related Rash    History   Social History  . Marital Status: Married    Spouse Name: N/A    Number of Children: N/A  . Years of Education: N/A   Occupational History  . Not on file.   Social History Main Topics  . Smoking status: Never  Smoker   . Smokeless tobacco: Never Used  . Alcohol Use: Yes     01/20/12 once/month  . Drug Use: No  . Sexually Active: Yes   Other Topics Concern  . Not on file   Social History Narrative   Marital status: married x 28 years to [place]; happily married; no abuse   Children: 2 children (38 yo son, 72 yo daughter); no grandchildren   Lives with: husband, daughter   Employment:  [employer] 2009-2013.  Resigned 08/2012.  Bachelor degrees in communication.     Tobacco:  None   Alcohol:  Once weekly; wine   Drugs: none   Exercise:  none          Family History  Problem Relation Age of Onset  . Colon cancer Paternal Uncle   .  Colon polyps Sister   . Depression Sister     suicide  . Colon polyps Brother   . Colon polyps Mother   . Dementia Mother   . Heart disease Father 16    CHF  . Macular degeneration Father     Objective:   Physical Exam  Constitutional: She is oriented to person, place, and time. She appears well-developed and well-nourished. She appears distressed.       MILD DISTRESS; LYING ON EXAMINATION TABLE.  HENT:  Head: Normocephalic and atraumatic.  Right Ear: Hearing, external ear and ear canal normal. Tympanic membrane mobility is abnormal.  Left Ear: Hearing, tympanic membrane, external ear and ear canal normal.  Nose: Nose normal.  Mouth/Throat: Oropharynx is clear and moist.       SINGLE VESICLE VERMILION BORDER R.  Eyes: Conjunctivae normal and EOM are normal. Pupils are equal, round, and reactive to light.  Neck: Normal range of motion. Neck supple. No JVD present. No thyromegaly present.  Cardiovascular: Normal rate, regular rhythm, normal heart sounds and intact distal pulses.  Exam reveals no gallop and no friction rub.   No murmur heard. Pulmonary/Chest: Effort normal and breath sounds normal. No respiratory distress. She has no wheezes. She has no rales.  Lymphadenopathy:    She has no cervical adenopathy.  Neurological: She is alert and oriented to person,  place, and time. She has normal strength and normal reflexes. She displays no tremor. No cranial nerve deficit or sensory deficit. She exhibits normal muscle tone. She displays a negative Romberg sign. Coordination and gait normal.       ABLE TO AMBULATE TO RESTROOM UNASSISTED.  DIX-HALLPIKE NEGATIVE; NO NYSTAGMUS; NO DIZZINESS TRIGGERED WITH MANEUVER.  Skin: Skin is warm and dry. No rash noted. She is not diaphoretic.  Psychiatric: She has a normal mood and affect. Her behavior is normal. Judgment and thought content normal.    Results for orders placed in visit on 09/04/12  POCT CBC      Component Value Range   WBC 3.4 (*) 4.6 - 10.2 K/uL   Lymph, poc 1.1  0.6 - 3.4   POC LYMPH PERCENT 31.8  10 - 50 %L   MID (cbc) 0.3  0 - 0.9   POC MID % 8.0  0 - 12 %M   POC Granulocyte 2.0  2 - 6.9   Granulocyte percent 60.2  37 - 80 %G   RBC 4.94  4.04 - 5.48 M/uL   Hemoglobin 13.4  12.2 - 16.2 g/dL   HCT, POC 16.1  09.6 - 47.9 %   MCV 85.5  80 - 97 fL   MCH, POC 27.1  27 - 31.2 pg   MCHC 31.8  31.8 - 35.4 g/dL   RDW, POC 04.5     Platelet Count, POC 281  142 - 424 K/uL   MPV 9.8  0 - 99.8 fL  POCT URINALYSIS DIPSTICK      Component Value Range   Color, UA yellow     Clarity, UA clear     Glucose, UA neg     Bilirubin, UA neg     Ketones, UA neg     Spec Grav, UA 1.020     Blood, UA neg     pH, UA 5.5     Protein, UA neg     Urobilinogen, UA 0.2     Nitrite, UA neg     Leukocytes, UA Negative    POCT UA - MICROSCOPIC  ONLY      Component Value Range   WBC, Ur, HPF, POC neg     RBC, urine, microscopic neg     Bacteria, U Microscopic trace     Mucus, UA neg     Epithelial cells, urine per micros 0-1     Crystals, Ur, HPF, POC neg     Casts, Ur, LPF, POC neg     Yeast, UA neg         Assessment & Plan:   1. Dizziness  POCT CBC, Comprehensive metabolic panel, TSH, POCT urinalysis dipstick, POCT UA - Microscopic Only, LORazepam (ATIVAN) 1 MG tablet, meclizine (ANTIVERT) 25 MG  tablet  2. Insomnia  LORazepam (ATIVAN) 1 MG tablet  3. Allergic rhinitis  fluticasone (FLONASE) 50 MCG/ACT nasal spray  4. Otalgia of right ear       1.  Dizziness:  New.  Ddx includes acute labrynthitis, stress reaction, early Zoster infection, BPV, medication withdrawal from missed Ativan dose.  Obtain labs.  Supportive treatment with Meclizine PRN; advised to take Ativan 1 mg 1/2 upon return to home.  RTC for acute worsening or for no improvement in 4-7 days.  Neurologically intact in office.   2.  Insomnia: Stable; with missed Ativan dose last night; advised to take 1/2 Ativan dose this morning.  3.  Allergic Rhinitis:  Worsening; decreased mobility of R TM; start Astelin daily; rx for Flonase sent to pharmacy. 4. Otalgia R ear:  New.  Ddx eustachian tube dysfunction, early Zoster considering vesicle R vermilion border.  Treat with Astelin, Flonase. RTC for development of rash R facial region.    Meds ordered this encounter  Medications  . LORazepam (ATIVAN) 1 MG tablet    Sig: 1 twice daily as needed    Dispense:  60 tablet    Refill:  3  . fluticasone (FLONASE) 50 MCG/ACT nasal spray    Sig: Place 2 sprays into the nose daily.    Dispense:  16 g    Refill:  6  . meclizine (ANTIVERT) 25 MG tablet    Sig: Take 1 tablet (25 mg total) by mouth 3 (three) times daily as needed for dizziness or nausea.    Dispense:  30 tablet    Refill:  0

## 2012-09-04 NOTE — Patient Instructions (Signed)
1. Dizziness  POCT CBC, Comprehensive metabolic panel, TSH, POCT urinalysis dipstick, POCT UA - Microscopic Only, LORazepam (ATIVAN) 1 MG tablet, meclizine (ANTIVERT) 25 MG tablet  2. Insomnia  LORazepam (ATIVAN) 1 MG tablet  3. Allergic rhinitis  fluticasone (FLONASE) 50 MCG/ACT nasal spray  4. Otalgia of right ear

## 2012-09-05 NOTE — Progress Notes (Signed)
Reviewed and agree.

## 2012-09-08 ENCOUNTER — Encounter: Payer: Self-pay | Admitting: Family Medicine

## 2012-09-08 DIAGNOSIS — D239 Other benign neoplasm of skin, unspecified: Secondary | ICD-10-CM

## 2012-09-09 NOTE — Progress Notes (Signed)
Reviewed and agree.

## 2012-09-11 ENCOUNTER — Other Ambulatory Visit: Payer: Self-pay | Admitting: Family Medicine

## 2012-09-11 DIAGNOSIS — J309 Allergic rhinitis, unspecified: Secondary | ICD-10-CM

## 2012-09-11 MED ORDER — FLUTICASONE PROPIONATE 50 MCG/ACT NA SUSP: 2.0000 | Freq: Every day | NASAL | Status: DC

## 2012-09-30 ENCOUNTER — Telehealth: Payer: Self-pay

## 2012-09-30 NOTE — Telephone Encounter (Signed)
PATIENT IS REQUESTING HER PRESCRIPTION BE WRITTEN FOR ADDERALL. SHE STATES THE WAY IT WAS WRITTEN, HER INSURANCE WILL NOT COVER IT. SHE SAID IT WAS WRITTEN FOR ADDERALL 5MG  AND FOR HER TO TAKE IT 3 TIMES A DAY. IT SHOULD BE WRITTEN ADDERALL 10MG  AND SHE SHOULD TAKE IT 2 TIMES A DAY. BEST PHONE [phone] (HOME)     MBC

## 2012-09-30 NOTE — Telephone Encounter (Signed)
Please advise on medication renewal, she wants increased dose. She is due for follow up with you on this also.

## 2012-10-01 NOTE — Telephone Encounter (Signed)
Ok to do 10mg  bid#60 F/u 12/13 or 1/14 I won't be in to sign til Monday/ in the event that you need it brfore then, then have a PA sign it

## 2012-10-02 ENCOUNTER — Ambulatory Visit (INDEPENDENT_AMBULATORY_CARE_PROVIDER_SITE_OTHER): Payer: Medicare Other | Admitting: Physician Assistant

## 2012-10-02 VITALS — BP 128/86 | HR 100 | Temp 98.3°F | Resp 17

## 2012-10-02 DIAGNOSIS — F988 Other specified behavioral and emotional disorders with onset usually occurring in childhood and adolescence: Secondary | ICD-10-CM

## 2012-10-02 DIAGNOSIS — I10 Essential (primary) hypertension: Secondary | ICD-10-CM

## 2012-10-02 DIAGNOSIS — C439 Malignant melanoma of skin, unspecified: Secondary | ICD-10-CM | POA: Insufficient documentation

## 2012-10-02 MED ORDER — AMPHETAMINE-DEXTROAMPHETAMINE 10 MG PO TABS: 10.0000 mg | ORAL_TABLET | Freq: Two times a day (BID) | ORAL | Status: DC

## 2012-10-02 NOTE — Telephone Encounter (Signed)
Spoke with patient and let her know that her prescription was ready for her at the front desk.  She will come by and pick it up.

## 2012-10-02 NOTE — Patient Instructions (Signed)
Don't check your blood pressure more than once daily.  Vary the times you check it (sometimes morning, sometimes evening).  Record the readings.  Turn off the the "freak out" switch in your brain.  Keep making healthy living choices.

## 2012-10-02 NOTE — Progress Notes (Signed)
Subjective:    Patient ID: Abigail [name], female    DOB: [birth year], 51 y.o.   MRN: [id number]  HPI  This 51 y.o. female presents for evaluation of HTN.  Reports BP has been going high over the past several weeks.  At 3 am today awoke "with a splitting HA" and BP was 190/100. No associated CP, SOB.  Recent dx of vertigo, which is much improved, but not resolved.  She has follow-up with Dr. Katrinka Blazing scheduled for about 2-3 weeks from now.  Is exercising regularly, and following Weight Watchers, but struggling with getting to a healthy weight.  Feels like she's retaining fluids. States "We must figure out why I'm gaining, we must."  However, she refused being weighed here today, stating, "I just can't take it."  She needs to pick up the Rx for Adderall she'd called for, but notes that her insurance will not pay for it if the instructions are for TID, only BID.  She takes 5 mg TID, so needs an RX for 10 mg, and she'll break them in-half, and take them TID.  Recent labs reviewed, which included TSH and thyroid antibodies, all of which were normal.  Past Medical History  Diagnosis Date  . Lupus   . Migraine   . Complication of anesthesia     Sjogrens Syndrome  . Sjogren's syndrome   . Shortness of breath     "related to the asthma"  . Vasculitis     "autoimmune"  . Diverticulitis   . Gastritis   . Duodenitis   . Pancreatitis   . Fibromyalgia   . Perforation bowel     "microperforation"  . Asthma   . Melanoma 2008    Back; Tafeen.  . Depression   . GERD (gastroesophageal reflux disease)   . Hypertension   . Urticaria     Past Surgical History  Procedure Date  . Melanoma excision   . Tmj arthroplasty 1970  . Appendectomy   . Cholecystectomy   . Pancreas surgery     sphincterotomy  . Abdominoplasty   . Liver biopsy   . Breast surgery   . Anterior cervical decomp/discectomy fusion 10/29/2011    Procedure: ANTERIOR CERVICAL DECOMPRESSION/DISCECTOMY FUSION 2 LEVELS;  Surgeon:  Karn Cassis;  Location: [facility];  Service: Neurosurgery;  Laterality: N/A;  Cervical five-six,Cervical six-seven nterior cervical decompression/diskectomy, fusion, plate  . Sigmoidectomy 03/2012    [facility]  . Abdominal hysterectomy     Partial; Ovaries intact.  DUB/fibroids.  . Colonoscopy 11/11/2008    Prior to Admission medications   Medication Sig Start Date End Date Taking? Authorizing Provider  albuterol (PROVENTIL HFA;VENTOLIN HFA) 108 (90 BASE) MCG/ACT inhaler Inhale 2 puffs into the lungs every 6 (six) hours as needed. For shortness of breath. 01/10/12  Yes Narciso Stoutenburg S Cashis Rill, PA-C  amphetamine-dextroamphetamine (ADDERALL) 10 MG tablet Take 1 tablet (5 mg total) by mouth 3 (three) times daily. 10/02/12  Yes Cobe Viney S Hugh Garrow, PA-C  azaTHIOprine (IMURAN) 50 MG tablet Take 50 mg by mouth 2 (two) times daily.  01/05/12  Yes Historical Provider, MD  azelastine (ASTELIN) 137 MCG/SPRAY nasal spray Place 2 sprays into the nose daily as needed. For stuffy nose. 02/23/12  Yes Dois Davenport, MD  divalproex (DEPAKOTE) 500 MG DR tablet Take 1 tablet (500 mg total) by mouth daily. 02/23/12  Yes Dois Davenport, MD  estradiol (ESTRACE) 1 MG tablet Take 1 tablet (1 mg total) by mouth daily. 08/17/12  Yes Ethelda Chick, MD  fluticasone [facility]) 50 MCG/ACT nasal spray Place 2 sprays into the nose daily. 09/11/12  Yes Ethelda Chick, MD  LORazepam (ATIVAN) 1 MG tablet 1 twice daily as needed 09/04/12  Yes Ethelda Chick, MD  losartan (COZAAR) 100 MG tablet Take 1 tablet (100 mg total) by mouth daily. 02/23/12 02/22/13 Yes Dois Davenport, MD  ranitidine (ZANTAC) 75 MG tablet Take 75 mg by mouth daily.     Yes Historical Provider, MD  SUMAtriptan (IMITREX) 100 MG tablet  07/29/12  Yes Historical Provider, MD  tretinoin (RETIN-A) 0.1 % cream Apply topically at bedtime. 02/23/12 02/22/13 Yes Dois Davenport, MD  zolpidem (AMBIEN) 10 MG tablet Take 1 tablet (10 mg total) by mouth at bedtime as needed for  sleep. 02/26/12 02/25/13 Yes Tonye Pearson, MD  meclizine (ANTIVERT) 25 MG tablet Take 1 tablet (25 mg total) by mouth 3 (three) times daily as needed for dizziness or nausea. 09/04/12   Ethelda Chick, MD    Allergies  Allergen Reactions  . Meperidine Hcl Other (See Comments)    tachycardia  . Doxycycline Hives  . Latex Other (See Comments)    Breathing problems.  . Promethazine Hcl Other (See Comments)    "uncontrolled vomiting"  . Sulfonamide Derivatives Hives  . Tetracyclines & Related Rash    History   Social History  . Marital Status: Married    Spouse Name: Nadine Counts    Number of Children: 2  . Years of Education: N/A   Occupational History  . currently not working    Social History Main Topics  . Smoking status: Never Smoker   . Smokeless tobacco: Never Used  . Alcohol Use: Yes     Comment: 01/20/12 once/month  . Drug Use: No  . Sexually Active: Yes -- Female partner(s)   Other Topics Concern  . Not on file   Social History Narrative   Marital status: married x 28 years to [place]; happily married; no abuse   Children: 2 children (42 yo son, 11 yo daughter); no grandchildren   Lives with: husband, daughter   Employment:  [employer] 2009-2013.  Resigned 08/2012.  Bachelor degrees in communication.     Tobacco:  None   Alcohol:  Once weekly; wine   Drugs: none   Exercise:  none          Family History  Problem Relation Age of Onset  . Colon cancer Paternal Uncle   . Colon polyps Sister   . Depression Sister     suicide  . Colon polyps Brother   . Colon polyps Mother   . Dementia Mother   . Heart disease Father 75    CHF  . Macular degeneration Father     Review of Systems As above.    Objective:   Physical Exam Blood pressure 128/86, pulse 100, temperature 98.3 F (36.8 C), temperature source Oral, resp. rate 17, SpO2 95.00%. There is no height or weight on file to calculate BMI. Well-developed, well nourished WF who is awake, alert and oriented, in NAD, but a  little anxious. HEENT: [place]/AT, sclera and conjunctiva are clear.   Neck: supple, non-tender, no lymphadenopathy, thyromegaly. Heart: RRR, no murmur Lungs: normal effort, CTA Extremities: no cyanosis, clubbing or edema. Skin: warm and dry. Psychologic: good mood and appropriate affect, a little anxious, normal speech and behavior.     Assessment & Plan:   1. Hypertension  Check BP not more than daily.  Bring a log for review with Dr. Katrinka Blazing, along with dietary and exercise log.  I believe her anxiety may be driving this.  2. ADD (attention deficit disorder)  amphetamine-dextroamphetamine (ADDERALL) 10 MG tablet

## 2012-10-02 NOTE — Telephone Encounter (Signed)
Signed at desk 

## 2012-10-02 NOTE — Telephone Encounter (Signed)
Can a PA print/sign Rx for Adderall 10mg  BID #60 for pt per Dr Merla Riches? I have pended it.

## 2012-10-06 ENCOUNTER — Other Ambulatory Visit: Payer: Self-pay | Admitting: Radiology

## 2012-10-06 MED ORDER — ZOLPIDEM TARTRATE 5 MG PO TABS: 5.0000 mg | ORAL_TABLET | Freq: Every evening | ORAL | Status: DC | PRN

## 2012-10-06 NOTE — Telephone Encounter (Signed)
Please advise on Ambien renewal, patient has been taking 10mg  dose, computer recommends 5mg  dose, I pended the 5mg , if you want 10 mg, please change Rx, sign and I can call it in. Amy

## 2012-10-07 ENCOUNTER — Telehealth: Payer: Self-pay | Admitting: Radiology

## 2012-10-07 NOTE — Telephone Encounter (Signed)
Called in the Ambien

## 2012-10-11 HISTORY — PX: OTHER SURGICAL HISTORY: SHX169

## 2012-10-16 ENCOUNTER — Telehealth: Payer: Self-pay

## 2012-10-16 NOTE — Telephone Encounter (Signed)
Patient would like to to put in orders for labs to be done today. She has appointment with Dr. Katrinka Blazing on Monday.

## 2012-10-16 NOTE — Telephone Encounter (Signed)
Can someone order labs for pt, or does Dr Katrinka Blazing need to do it herself?

## 2012-10-16 NOTE — Telephone Encounter (Signed)
Dr. Katrinka Blazing will need to do it-we don't know what she intends to order.

## 2012-10-18 ENCOUNTER — Ambulatory Visit (INDEPENDENT_AMBULATORY_CARE_PROVIDER_SITE_OTHER): Payer: Medicare Other | Admitting: Family Medicine

## 2012-10-18 VITALS — BP 140/91 | HR 78 | Temp 98.4°F | Resp 18 | Ht 64.0 in | Wt 210.0 lb

## 2012-10-18 DIAGNOSIS — R7989 Other specified abnormal findings of blood chemistry: Secondary | ICD-10-CM

## 2012-10-18 DIAGNOSIS — M329 Systemic lupus erythematosus, unspecified: Secondary | ICD-10-CM

## 2012-10-18 DIAGNOSIS — E876 Hypokalemia: Secondary | ICD-10-CM

## 2012-10-18 DIAGNOSIS — R42 Dizziness and giddiness: Secondary | ICD-10-CM

## 2012-10-18 DIAGNOSIS — I10 Essential (primary) hypertension: Secondary | ICD-10-CM | POA: Insufficient documentation

## 2012-10-18 DIAGNOSIS — Z780 Asymptomatic menopausal state: Secondary | ICD-10-CM | POA: Insufficient documentation

## 2012-10-18 DIAGNOSIS — R002 Palpitations: Secondary | ICD-10-CM

## 2012-10-18 LAB — POCT CBC
Hemoglobin: 14.1 g/dL (ref 12.2–16.2)
MCH, POC: 26.6 pg — AB (ref 27–31.2)
MPV: 10.2 fL (ref 0–99.8)
POC MID %: 8.8 %M (ref 0–12)
RBC: 5.31 M/uL (ref 4.04–5.48)
WBC: 3.9 10*3/uL — AB (ref 4.6–10.2)

## 2012-10-18 LAB — POCT URINALYSIS DIPSTICK
Bilirubin, UA: NEGATIVE
Blood, UA: NEGATIVE
Glucose, UA: NEGATIVE
Leukocytes, UA: NEGATIVE
Nitrite, UA: NEGATIVE

## 2012-10-18 LAB — COMPREHENSIVE METABOLIC PANEL
ALT: 42 U/L — ABNORMAL HIGH (ref 0–35)
AST: 41 U/L — ABNORMAL HIGH (ref 0–37)
Albumin: 4.4 g/dL (ref 3.5–5.2)
Alkaline Phosphatase: 79 U/L (ref 39–117)
Glucose, Bld: 93 mg/dL (ref 70–99)
Potassium: 4.3 mEq/L (ref 3.5–5.3)
Sodium: 137 mEq/L (ref 135–145)
Total Protein: 7 g/dL (ref 6.0–8.3)

## 2012-10-18 LAB — MAGNESIUM: Magnesium: 2.1 mg/dL (ref 1.5–2.5)

## 2012-10-18 MED ORDER — HYDROCHLOROTHIAZIDE 12.5 MG PO TABS: 12.5000 mg | ORAL_TABLET | Freq: Every day | ORAL | Status: DC

## 2012-10-18 NOTE — Assessment & Plan Note (Signed)
Persistent; repeat LFTs today and fax to rheumatology.

## 2012-10-18 NOTE — Progress Notes (Signed)
[address]   [place], [place]  [address]   [phone]  Subjective:    Patient ID: Abigail [name], female    DOB: 12/03/1960, 51 y.o.   MRN: [id number]  HPIThis 51 y.o. female presents for evaluation of HTN.  BP worsened three or four weeks ago.  Presented two weeks ago; BP at office was normal.  No changes in management.  Home BP 154/112, 149/101.  Checked BP at sister's house and diastolic elevated over 100.  Awoke on day of last visit with headache; usually will get HA with elevated blood pressure.  Also having muscle aching, started taking potassium supplement; feels well but has HA.  Much more active than when working.  Paying more attention to food choices.  Eating out less.Feels swollen.  Has been on diuretic in past; took HCTZ in past.  Changed from HCTZ due to side effect.  SULFA allergy.  Redecoratoring entire house.  +palpitations at times; every other day.  Will cough to calm down fluttering. Fluttering is new.  Much less caffeine.  Decreasing diet soda intake.  Previously took Triamterene-HCTZ with hypokalemia thus switched to Cozaar.  Would like diuretic added to Cozaar.  Palpitations seem to have improved with starting K supplementation.  2.  Elevated LFTs: due for eight week follow-up LFTs to be faxed to rheumatology.  3.  Hot flashes:  Improved/resolved with Estrace daily.  S/p recent follow-up with gynecology for annual exam.  S/p lupus cardiolipin testing by rheumatology several times in past and always negative.    4.  Dizziness: 90% improved; will suffer with brief episodes of dizziness; will have one episode per day; will have dizziness for 5-10 seconds.   Review of Systems  Constitutional: Negative for fever, chills, diaphoresis and fatigue.  Respiratory: Negative for shortness of breath and wheezing.   Cardiovascular: Positive for palpitations and leg swelling. Negative for chest pain.  Gastrointestinal: Negative for nausea.  Neurological: Positive for headaches.  Negative for dizziness, tremors, seizures, syncope, facial asymmetry, speech difficulty, weakness, light-headedness and numbness.        Past Medical History  Diagnosis Date  . Lupus   . Migraine   . Complication of anesthesia     Sjogrens Syndrome  . Sjogren's syndrome   . Shortness of breath     "related to the asthma"  . Vasculitis     "autoimmune"  . Diverticulitis   . Gastritis   . Duodenitis   . Pancreatitis   . Fibromyalgia   . Perforation bowel     "microperforation"  . Asthma   . Melanoma 2008    Back; [place].  . Depression   . GERD (gastroesophageal reflux disease)   . Hypertension   . Urticaria     Past Surgical History  Procedure Date  . Melanoma excision   . Tmj arthroplasty 1970  . Appendectomy   . Cholecystectomy   . Pancreas surgery     sphincterotomy  . Abdominoplasty   . Liver biopsy   . Breast surgery   . Anterior cervical decomp/discectomy fusion 10/29/2011    Procedure: ANTERIOR CERVICAL DECOMPRESSION/DISCECTOMY FUSION 2 LEVELS;  Surgeon: Karn Cassis;  Location: [facility];  Service: Neurosurgery;  Laterality: N/A;  Cervical five-six,Cervical six-seven nterior cervical decompression/diskectomy, fusion, plate  . Sigmoidectomy 03/2012    [facility]  . Abdominal hysterectomy     Partial; Ovaries intact.  DUB/fibroids.  . Colonoscopy 11/11/2008    Prior to Admission medications   Medication Sig  Start Date End Date Taking? Authorizing Provider  albuterol (PROVENTIL HFA;VENTOLIN HFA) 108 (90 BASE) MCG/ACT inhaler Inhale 2 puffs into the lungs every 6 (six) hours as needed. For shortness of breath. 01/10/12  Yes Chelle S Jeffery, PA-C  amphetamine-dextroamphetamine (ADDERALL) 10 MG tablet Take 1 tablet (10 mg total) by mouth 2 (two) times daily. 10/02/12  Yes Eleanore Delia Chimes, PA-C  azaTHIOprine (IMURAN) 50 MG tablet Take 50 mg by mouth 2 (two) times daily.  01/05/12  Yes Historical Provider, MD  divalproex (DEPAKOTE) 500 MG DR tablet Take 1  tablet (500 mg total) by mouth daily. 02/23/12  Yes Dois Davenport, MD  estradiol (ESTRACE) 1 MG tablet Take 1 tablet (1 mg total) by mouth daily. 08/17/12  Yes Ethelda Chick, MD  LORazepam (ATIVAN) 1 MG tablet 1 twice daily as needed 09/04/12  Yes Ethelda Chick, MD  losartan (COZAAR) 100 MG tablet Take 1 tablet (100 mg total) by mouth daily. 02/23/12 02/22/13 Yes Dois Davenport, MD  potassium citrate (UROCIT-K) 10 MEQ (1080 MG) SR tablet Take 10 mEq by mouth daily.   Yes Historical Provider, MD  tretinoin (RETIN-A) 0.1 % cream Apply topically at bedtime. 02/23/12 02/22/13 Yes Dois Davenport, MD  zolpidem (AMBIEN) 5 MG tablet Take 10 mg by mouth at bedtime as needed. Patient has 10 mg tablets. Cuts these in half. 10/06/12  Yes Tonye Pearson, MD  azelastine (ASTELIN) 137 MCG/SPRAY nasal spray Place 2 sprays into the nose daily as needed. For stuffy nose. 02/23/12   Dois Davenport, MD  fluticasone (FLONASE) 50 MCG/ACT nasal spray Place 2 sprays into the nose daily. 09/11/12   Ethelda Chick, MD    Allergies  Allergen Reactions  . Meperidine Hcl Other (See Comments)    tachycardia  . Doxycycline Hives  . Latex Other (See Comments)    Breathing problems.  . Promethazine Hcl Other (See Comments)    "uncontrolled vomiting"  . Sulfonamide Derivatives Hives  . Tetracyclines & Related Rash    History   Social History  . Marital Status: Married    Spouse Name: Nadine Counts    Number of Children: 2  . Years of Education: N/A   Occupational History  .  [employer]   Social History Main Topics  . Smoking status: Never Smoker   . Smokeless tobacco: Never Used  . Alcohol Use: Yes     Comment: 01/20/12 once/month  . Drug Use: No  . Sexually Active: Yes -- Female partner(s)   Other Topics Concern  . Not on file   Social History Narrative   Marital status: married x 28 years to [place]; happily married; no abuse   Children: 2 children (65 yo son, 42 yo daughter); no grandchildren   Lives with:  husband, daughter   Employment:  [employer] 2009-2013.  Resigned 08/2012.  Bachelor degrees in communication.     Tobacco:  None   Alcohol:  Once weekly; wine   Drugs: none   Exercise:  none          Family History  Problem Relation Age of Onset  . Colon cancer Paternal Uncle   . Colon polyps Sister   . Depression Sister     suicide  . Colon polyps Brother   . Colon polyps Mother   . Dementia Mother   . Heart disease Father 95    CHF  . Macular degeneration Father     Objective:   Physical Exam  Nursing note  and vitals reviewed. Constitutional: She is oriented to person, place, and time. She appears well-developed and well-nourished. No distress.  Eyes: Conjunctivae normal are normal. Pupils are equal, round, and reactive to light.  Neck: Normal range of motion. Neck supple. No JVD present. No thyromegaly present.  Cardiovascular: Normal rate and regular rhythm.  Exam reveals no gallop and no friction rub.   Murmur heard. Pulmonary/Chest: Effort normal and breath sounds normal. She has no wheezes. She has no rales.  Musculoskeletal: She exhibits no edema.  Lymphadenopathy:    She has no cervical adenopathy.  Neurological: She is alert and oriented to person, place, and time. No cranial nerve deficit. She exhibits normal muscle tone. Coordination normal.  Skin: She is not diaphoretic.  Psychiatric: She has a normal mood and affect. Her behavior is normal. Judgment and thought content normal.   EKG:  NSR. +PVC   Results for orders placed in visit on 10/18/12  POCT CBC      Component Value Range   WBC 3.9 (*) 4.6 - 10.2 K/uL   Lymph, poc 1.4  0.6 - 3.4   POC LYMPH PERCENT 35.6  10 - 50 %L   MID (cbc) 0.3  0 - 0.9   POC MID % 8.8  0 - 12 %M   POC Granulocyte 2.2  2 - 6.9   Granulocyte percent 55.6  37 - 80 %G   RBC 5.31  4.04 - 5.48 M/uL   Hemoglobin 14.1  12.2 - 16.2 g/dL   HCT, POC 40.9  81.1 - 47.9 %   MCV 87.7  80 - 97 fL   MCH, POC 26.6 (*) 27 - 31.2 pg   MCHC 30.3 (*)  31.8 - 35.4 g/dL   RDW, POC 91.4     Platelet Count, POC 251  142 - 424 K/uL   MPV 10.2  0 - 99.8 fL  POCT URINALYSIS DIPSTICK      Component Value Range   Color, UA yellow     Clarity, UA clear     Glucose, UA neg     Bilirubin, UA neg     Ketones, UA neg     Spec Grav, UA 1.020     Blood, UA neg     pH, UA 6.0     Protein, UA neg     Urobilinogen, UA 0.2     Nitrite, UA neg     Leukocytes, UA Negative         Assessment & Plan:   1. Essential hypertension, benign  POCT CBC, POCT UA - Microscopic Only, POCT urinalysis dipstick, Magnesium, EKG 12-Lead  2. Palpitations  POCT CBC, POCT UA - Microscopic Only, POCT urinalysis dipstick, Magnesium, EKG 12-Lead  3. Hypokalemia  POCT CBC, POCT UA - Microscopic Only, POCT urinalysis dipstick, Magnesium, EKG 12-Lead  4.  Elevated LFTs 5. Menopausal Hot Flashes 6. SLE 7. Dizziness

## 2012-10-18 NOTE — Assessment & Plan Note (Signed)
Improved vasomotor instability with Estrace.

## 2012-10-18 NOTE — Assessment & Plan Note (Signed)
Much improved from 08/2012 visit.  Neurologically intact in office.

## 2012-10-18 NOTE — Assessment & Plan Note (Signed)
Stable.  Obtain ESR.  Will obtain LFTs for rheumatology.

## 2012-10-18 NOTE — Assessment & Plan Note (Signed)
Chronic issue; obtain potassium and magnesium levels today; recent palpitations which frequently occurs with hypokalemia.

## 2012-10-18 NOTE — Assessment & Plan Note (Signed)
New.  Onset in past 3-4 weeks; improved with addition of K supplementation; EKG with single PVC.  If persists, will warrant referral to cardiology for Holter monitor and 2D-echo.

## 2012-10-18 NOTE — Assessment & Plan Note (Signed)
Worsening.  Add HCTZ 12.5mg  daily.  Continue Losartan 100mg  daily.  Obtain labs.  Monitor K level closely due to history of hypokalemia.

## 2012-10-18 NOTE — Patient Instructions (Addendum)
1. Essential hypertension, benign  POCT CBC, POCT urinalysis dipstick, Magnesium, EKG 12-Lead, Comprehensive metabolic panel  2. Palpitations  POCT CBC, POCT urinalysis dipstick, Magnesium, EKG 12-Lead, Comprehensive metabolic panel  3. Hypokalemia  POCT CBC, POCT urinalysis dipstick, Magnesium, EKG 12-Lead, Comprehensive metabolic panel  4. Elevated LFTs    5. Menopause    6. SLE (systemic lupus erythematosus)  POCT SEDIMENTATION RATE

## 2012-10-19 NOTE — Telephone Encounter (Signed)
Patient evaluated in office 10/18/12; labs obtained then.  No further orders warranted at this time. KMS

## 2012-10-19 NOTE — Progress Notes (Signed)
Reviewed and agree.

## 2012-10-20 ENCOUNTER — Ambulatory Visit: Payer: Medicare Other | Admitting: Family Medicine

## 2012-11-02 ENCOUNTER — Ambulatory Visit (INDEPENDENT_AMBULATORY_CARE_PROVIDER_SITE_OTHER): Payer: Medicare Other | Admitting: Family Medicine

## 2012-11-02 VITALS — BP 113/74 | HR 89 | Temp 98.2°F | Resp 16 | Ht 65.0 in | Wt 213.0 lb

## 2012-11-02 DIAGNOSIS — I10 Essential (primary) hypertension: Secondary | ICD-10-CM

## 2012-11-02 DIAGNOSIS — G4733 Obstructive sleep apnea (adult) (pediatric): Secondary | ICD-10-CM

## 2012-11-02 DIAGNOSIS — R002 Palpitations: Secondary | ICD-10-CM

## 2012-11-02 DIAGNOSIS — R7989 Other specified abnormal findings of blood chemistry: Secondary | ICD-10-CM

## 2012-11-02 LAB — COMPREHENSIVE METABOLIC PANEL
Alkaline Phosphatase: 88 U/L (ref 39–117)
BUN: 12 mg/dL (ref 6–23)
Glucose, Bld: 105 mg/dL — ABNORMAL HIGH (ref 70–99)
Total Bilirubin: 0.3 mg/dL (ref 0.3–1.2)

## 2012-11-02 NOTE — Progress Notes (Signed)
[address]   [place], [place]  [address]   [phone]  Subjective:    Patient ID: Abigail [name], female    DOB: [birth year], 51 y.o.   MRN: [id number]  HPIThis 51 y.o. female presents for evaluation of the following:  1. HTN:  Three week follow-up; management at last visit included adding HCTZ 12.5mg .  History of hypokalemia with diuretic use so wants K level checked.  Taking of KCL daily.  Puffiness in hands resolved with HCTZ.  Very sensitive to elevated blood pressure; very uncomfortable.  Loving not working; volunteering a ton.    2. Palpitations:  Resolved since last visit.    3. 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  Plans to walk to [facility] instead of driving.  4.  Migraines: s/p follow-up with Dr. Sandria Manly; also suffering with excessive fatigue; sleeping all the time.  Scheduled sleep study by [facility]; +severe OSA.  Using husband's CPAP machine.  Not sure when getting machine.  4.  OSA severe:  S/p sleep study; awaiting delivery of CPAP machine.    Review of Systems  Constitutional: Positive for fatigue. Negative for fever, chills and diaphoresis.  Respiratory: Positive for apnea. Negative for shortness of breath, wheezing and stridor.   Cardiovascular: Negative for chest pain, palpitations and leg swelling.  Neurological: Negative for dizziness, tremors, seizures, facial asymmetry, speech difficulty, weakness, light-headedness and numbness.       Objective:   Physical Exam  Nursing note and vitals reviewed. Constitutional: She is oriented to person, place, and time. She appears well-developed and well-nourished. No distress.  HENT:  Mouth/Throat: Oropharynx is clear and moist.  Eyes: Conjunctivae and EOM are normal. Pupils are equal, round, and reactive to light.  Neck: Normal range of motion. Neck supple. No JVD present. No thyromegaly present.  Cardiovascular: Normal rate, regular rhythm, normal heart sounds and intact  distal pulses.  Exam reveals no gallop and no friction rub.   No murmur heard. Pulmonary/Chest: Effort normal and breath sounds normal. She has no wheezes. She has no rales.  Lymphadenopathy:    She has no cervical adenopathy.  Neurological: She is alert and oriented to person, place, and time.  Skin: She is not diaphoretic.  Psychiatric: She has a normal mood and affect. Her behavior is normal.           Past Medical History  Diagnosis Date  . Lupus   . Migraine   . Complication of anesthesia     Sjogrens Syndrome  . Sjogren's syndrome   . Shortness of breath     "related to the asthma"  . Vasculitis     "autoimmune"  . Diverticulitis   . Gastritis   . Duodenitis   . Pancreatitis   . Fibromyalgia   . Perforation bowel     "microperforation"  . Asthma   . Melanoma 2008    Back; Tafe[place].  . Depression   . GERD (gastroesophageal reflux disease)   . Hypertension   . Urticaria     Past Surgical History  Procedure Date  . Melanoma excision   . Tmj arthroplasty 1970  . Appendectomy   . Cholecystectomy   . Pancreas surgery     sphincterotomy  . Abdominoplasty   . Liver biopsy   . Breast surgery   . Anterior cervical decomp/discectomy fusion 10/29/2011    Procedure: ANTERIOR CERVICAL DECOMPRESSION/DISCECTOMY FUSION 2 LEVELS;  Surgeon: Karn Cassis;  Location: [facility];  Service: Neurosurgery;  Laterality: N/A;  Cervical five-six,Cervical six-seven nterior cervical decompression/diskectomy, fusion, plate  . Sigmoidectomy 03/2012    done at [facility]  . Abdominal hysterectomy     Partial; Ovaries intact.  DUB/fibroids.  . Colonoscopy 11/11/2008    Prior to Admission medications   Medication Sig Start Date End Date Taking? Authorizing Provider  albuterol (PROVENTIL HFA;VENTOLIN HFA) 108 (90 BASE) MCG/ACT inhaler Inhale 2 puffs into the lungs every 6 (six) hours as needed. For shortness of breath. 01/10/12  Yes Chelle S Jeffery, PA-C  amphetamine-dextroamphetamine  (ADDERALL) 10 MG tablet Take 1 tablet (10 mg total) by mouth 2 (two) times daily. 10/02/12  Yes Eleanore Delia Chimes, PA-C  azaTHIOprine (IMURAN) 50 MG tablet Take 50 mg by mouth 2 (two) times daily.  01/05/12  Yes Historical Provider, MD  azelastine (ASTELIN) 137 MCG/SPRAY nasal spray Place 2 sprays into the nose daily as needed. For stuffy nose. 02/23/12  Yes Dois Davenport, MD  divalproex (DEPAKOTE) 500 MG DR tablet Take 1 tablet (500 mg total) by mouth daily. 02/23/12  Yes Dois Davenport, MD  estradiol (ESTRACE) 1 MG tablet Take 1 tablet (1 mg total) by mouth daily. 08/17/12  Yes Ethelda Chick, MD  fluticasone (FLONASE) 50 MCG/ACT nasal spray Place 2 sprays into the nose daily. 09/11/12  Yes Ethelda Chick, MD  hydrochlorothiazide (HYDRODIURIL) 12.5 MG tablet Take 1 tablet (12.5 mg total) by mouth daily. 10/18/12  Yes Ethelda Chick, MD  LORazepam (ATIVAN) 1 MG tablet 1 twice daily as needed 09/04/12  Yes Ethelda Chick, MD  losartan (COZAAR) 100 MG tablet Take 1 tablet (100 mg total) by mouth daily. 02/23/12 02/22/13 Yes Dois Davenport, MD  potassium citrate (UROCIT-K) 10 MEQ (1080 MG) SR tablet Take 10 mEq by mouth daily.   Yes Historical Provider, MD  tretinoin (RETIN-A) 0.1 % cream Apply topically at bedtime. 02/23/12 02/22/13 Yes Dois Davenport, MD  zolpidem (AMBIEN) 5 MG tablet Take 10 mg by mouth at bedtime as needed. Patient has 10 mg tablets. Cuts these in half. 10/06/12  Yes Tonye Pearson, MD    Allergies  Allergen Reactions  . Meperidine Hcl Other (See Comments)    tachycardia  . Doxycycline Hives  . Latex Other (See Comments)    Breathing problems.  . Promethazine Hcl Other (See Comments)    "uncontrolled vomiting"  . Sulfonamide Derivatives Hives  . Tetracyclines & Related Rash    History   Social History  . Marital Status: Married    Spouse Name: Nadine Counts    Number of Children: 2  . Years of Education: N/A   Occupational History  .  [employer]   Social History Main  Topics  . Smoking status: Never Smoker   . Smokeless tobacco: Never Used  . Alcohol Use: Yes     Comment: 01/20/12 once/month  . Drug Use: No  . Sexually Active: Yes -- Female partner(s)   Other Topics Concern  . Not on file   Social History Narrative   Marital status: married x 28 years to [place]; happily married; no abuse   Children: 2 children (29 yo son, 35 yo daughter); no grandchildren   Lives with: husband, daughter   Employment:  [employer] 2009-2013.  Resigned 08/2012.  Bachelor degrees in communication.     Tobacco:  None   Alcohol:  Once weekly; wine   Drugs: none   Exercise:  none  Family History  Problem Relation Age of Onset  . Colon cancer Paternal Uncle   . Colon polyps Sister   . Depression Sister     suicide  . Colon polyps Brother   . Colon polyps Mother   . Dementia Mother   . Heart disease Father 57    CHF  . Macular degeneration Father     Assessment & Plan:   1. Essential hypertension, benign  Comprehensive metabolic panel  2. Palpitations    3. Elevated liver function tests    4. OSA severe   1. HTN: improved/controlled; no change in medication at this time; obtain labs. 2. Palpitations: Improved/resolved.   3.  Elevated LFTs: persistent; repeat today. 4.  OSA: New.  S/p sleep study per neurology; awaiting delivery of CPAP machine.

## 2012-11-02 NOTE — Patient Instructions (Addendum)
1. Essential hypertension, benign  Comprehensive metabolic panel  2. Palpitations    3. Elevated liver function tests

## 2012-11-10 ENCOUNTER — Ambulatory Visit (INDEPENDENT_AMBULATORY_CARE_PROVIDER_SITE_OTHER): Payer: Medicare Other | Admitting: Physician Assistant

## 2012-11-10 VITALS — BP 118/74 | HR 81 | Temp 97.4°F | Resp 16 | Ht 65.0 in | Wt 213.2 lb

## 2012-11-10 DIAGNOSIS — M3500 Sicca syndrome, unspecified: Secondary | ICD-10-CM

## 2012-11-10 DIAGNOSIS — G2589 Other specified extrapyramidal and movement disorders: Secondary | ICD-10-CM

## 2012-11-10 NOTE — Progress Notes (Signed)
Patient presents for ferritin level, ordered by her neurologist for evaluation of restless leg symptoms.

## 2012-11-12 ENCOUNTER — Ambulatory Visit: Payer: Medicare Other

## 2012-11-13 ENCOUNTER — Ambulatory Visit (INDEPENDENT_AMBULATORY_CARE_PROVIDER_SITE_OTHER): Payer: Medicare Other | Admitting: Emergency Medicine

## 2012-11-13 ENCOUNTER — Encounter: Payer: Self-pay | Admitting: Family Medicine

## 2012-11-13 ENCOUNTER — Encounter: Payer: Self-pay | Admitting: Physician Assistant

## 2012-11-13 ENCOUNTER — Ambulatory Visit: Payer: Medicare Other

## 2012-11-13 ENCOUNTER — Telehealth: Payer: Self-pay | Admitting: Radiology

## 2012-11-13 ENCOUNTER — Other Ambulatory Visit: Payer: Self-pay | Admitting: *Deleted

## 2012-11-13 VITALS — BP 117/79 | HR 111 | Temp 99.0°F | Resp 18

## 2012-11-13 DIAGNOSIS — J029 Acute pharyngitis, unspecified: Secondary | ICD-10-CM

## 2012-11-13 DIAGNOSIS — R059 Cough, unspecified: Secondary | ICD-10-CM

## 2012-11-13 DIAGNOSIS — J111 Influenza due to unidentified influenza virus with other respiratory manifestations: Secondary | ICD-10-CM

## 2012-11-13 DIAGNOSIS — R509 Fever, unspecified: Secondary | ICD-10-CM

## 2012-11-13 DIAGNOSIS — R05 Cough: Secondary | ICD-10-CM

## 2012-11-13 LAB — POCT CBC
HCT, POC: 41.1 % (ref 37.7–47.9)
Hemoglobin: 13.3 g/dL (ref 12.2–16.2)
Lymph, poc: 1 (ref 0.6–3.4)
MCH, POC: 27.8 pg (ref 27–31.2)
MCHC: 32.1 g/dL (ref 31.8–35.4)
MPV: 10 fL (ref 0–99.8)
POC Granulocyte: 4.9 (ref 2–6.9)
POC LYMPH PERCENT: 16.4 %L (ref 10–50)
POC MID %: 6.8 %M (ref 0–12)
RDW, POC: 14.5 %
WBC: 6.4 10*3/uL (ref 4.6–10.2)

## 2012-11-13 LAB — POCT SEDIMENTATION RATE: POCT SED RATE: 50 mm/hr — AB (ref 0–22)

## 2012-11-13 MED ORDER — OSELTAMIVIR PHOSPHATE 75 MG PO CAPS: 75.0000 mg | ORAL_CAPSULE | Freq: Two times a day (BID) | ORAL | Status: DC

## 2012-11-13 MED ORDER — ONDANSETRON 4 MG PO TBDP: 4.0000 mg | ORAL_TABLET | Freq: Three times a day (TID) | ORAL | Status: DC | PRN

## 2012-11-13 MED ORDER — HYDROCODONE-HOMATROPINE 5-1.5 MG/5ML PO SYRP: 5.0000 mL | ORAL_SOLUTION | Freq: Three times a day (TID) | ORAL | Status: DC | PRN

## 2012-11-13 NOTE — Patient Instructions (Addendum)
Influenza, Adult Influenza ("the flu") is a viral infection of the respiratory tract. It occurs more often in winter months because people spend more time in close contact with one another. Influenza can make you feel very sick. Influenza easily spreads from person to person (contagious). CAUSES   Influenza is caused by a virus that infects the respiratory tract. You can catch the virus by breathing in droplets from an infected person's cough or sneeze. You can also catch the virus by touching something that was recently contaminated with the virus and then touching your mouth, nose, or eyes. SYMPTOMS   Symptoms typically last 4 to 10 days and may include:  Fever.   Chills.   Headache, body aches, and muscle aches.   Sore throat.   Chest discomfort and cough.   Poor appetite.   Weakness or feeling tired.   Dizziness.   Nausea or vomiting.  DIAGNOSIS   Diagnosis of influenza is often made based on your history and a physical exam. A nose or throat swab test can be done to confirm the diagnosis. RISKS AND COMPLICATIONS You may be at risk for a more severe case of influenza if you smoke cigarettes, have diabetes, have chronic heart disease (such as heart failure) or lung disease (such as asthma), or if you have a weakened immune system. Elderly people and pregnant women are also at risk for more serious infections. The most common complication of influenza is a lung infection (pneumonia). Sometimes, this complication can require emergency medical care and may be life-threatening. PREVENTION   An annual influenza vaccination (flu shot) is the best way to avoid getting influenza. An annual flu shot is now routinely recommended for all adults in the [place] TREATMENT   In mild cases, influenza goes away on its own. Treatment is directed at relieving symptoms. For more severe cases, your caregiver may prescribe antiviral medicines to shorten the sickness. Antibiotic medicines are not effective,  because the infection is caused by a virus, not by bacteria. HOME CARE INSTRUCTIONS  Only take over-the-counter or prescription medicines for pain, discomfort, or fever as directed by your caregiver.   Use a cool mist humidifier to make breathing easier.   Get plenty of rest until your temperature returns to normal. This usually takes 3 to 4 days.   Drink enough fluids to keep your urine clear or pale yellow.   Cover your mouth and nose when coughing or sneezing, and wash your hands well to avoid spreading the virus.   Stay home from work or school until your fever has been gone for at least 1 full day.  SEEK MEDICAL CARE IF:    You have chest pain or a deep cough that worsens or produces more mucus.   You have nausea, vomiting, or diarrhea.  SEEK IMMEDIATE MEDICAL CARE IF:    You have difficulty breathing, shortness of breath, or your skin or nails turn bluish.   You have severe neck pain or stiffness.   You have a severe headache, facial pain, or earache.   You have a worsening or recurring fever.   You have nausea or vomiting that cannot be controlled.  MAKE SURE YOU:  Understand these instructions.   Will watch your condition.   Will get help right away if you are not doing well or get worse.  Document Released: 10/25/2000 Document Revised: 04/28/2012 Document Reviewed: 01/27/2012 [employer] Patient Information 2013 [employer].    

## 2012-11-13 NOTE — Progress Notes (Signed)
  Subjective:    Patient ID: Abigail [name], female    DOB: [birth year], 52 y.o.   MRN: [id number]  HPI patient here with onset 4 days ago of sore throat head congestion ear pain. She also suffers from swollen glands in her neck. She has had a cough but it has been essentially nonproductive. She has had aching in her muscles. She is currently on Imuran as an immunosuppressive. She suffers from Sjgren syndrome.    Review of Systems     Objective:   Physical Exam patient appears ill but not toxic. She has a temperature of 99. There is mild redness of the conjunctiva. The TMs are clear. The turbinates are red and swollen throat is slightly red there is no edema noted no exudate noted. There is no definite cervical adenopathy noted chest was clear to auscultation and percussion. Cardiac exam reveals a regular rate and rhythm [facility] reading (PRIMARY) by  Dr. [name] NAD   Results for orders placed in visit on 11/13/12  POCT CBC      Component Value Range   WBC 6.4  4.6 - 10.2 K/uL   Lymph, poc 1.0  0.6 - 3.4   POC LYMPH PERCENT 16.4  10 - 50 %L   MID (cbc) 0.4  0 - 0.9   POC MID % 6.8  0 - 12 %M   POC Granulocyte 4.9  2 - 6.9   Granulocyte percent 76.8  37 - 80 %G   RBC 4.78  4.04 - 5.48 M/uL   Hemoglobin 13.3  12.2 - 16.2 g/dL   HCT, POC 78.2  95.6 - 47.9 %   MCV 86.7  80 - 97 fL   MCH, POC 27.8  27 - 31.2 pg   MCHC 32.1  31.8 - 35.4 g/dL   RDW, POC 21.3     Platelet Count, POC 215  142 - 424 K/uL   MPV 10.0  0 - 99.8 fL  POCT INFLUENZA A/B      Component Value Range   Influenza A, POC Positive     Influenza B, POC Negative    POCT RAPID STREP A (OFFICE)      Component Value Range   Rapid Strep A Screen Negative  Negative         Assessment & Plan:  Patient presents with flulike symptoms. We'll go ahead and check CBC flu test strep test and chest x-ray. Patient appears somewhat dry. We'll go ahead and treat with IV fluids start Tamiflu even though his leg due to her  immunocompromised status. She is positive for influenza A.

## 2012-11-13 NOTE — Telephone Encounter (Signed)
Have sent ODT zofran for her per Dr [name].

## 2012-11-14 ENCOUNTER — Emergency Department (HOSPITAL_COMMUNITY)
Admission: EM | Admit: 2012-11-14 | Discharge: 2012-11-14 | Discharge status: Against Medical Advice | Payer: Medicare Other

## 2012-11-14 ENCOUNTER — Encounter: Payer: Self-pay | Admitting: Family Medicine

## 2012-11-14 ENCOUNTER — Other Ambulatory Visit: Payer: Self-pay | Admitting: Emergency Medicine

## 2012-11-14 ENCOUNTER — Ambulatory Visit: Payer: Medicare Other

## 2012-11-14 ENCOUNTER — Encounter (HOSPITAL_COMMUNITY): Payer: Self-pay | Admitting: *Deleted

## 2012-11-14 ENCOUNTER — Encounter: Payer: Self-pay | Admitting: Emergency Medicine

## 2012-11-14 ENCOUNTER — Ambulatory Visit (INDEPENDENT_AMBULATORY_CARE_PROVIDER_SITE_OTHER): Payer: Medicare Other | Admitting: Emergency Medicine

## 2012-11-14 ENCOUNTER — Observation Stay (HOSPITAL_COMMUNITY)
Admission: AD | Admit: 2012-11-14 | Discharge: 2012-11-16 | Disposition: A | Discharge status: Home / Self Care | Payer: Medicare Other | Source: Ambulatory Visit | Attending: Family Medicine | Admitting: Family Medicine

## 2012-11-14 VITALS — BP 111/87 | HR 120 | Temp 99.4°F | Resp 19

## 2012-11-14 DIAGNOSIS — R0602 Shortness of breath: Secondary | ICD-10-CM

## 2012-11-14 DIAGNOSIS — R42 Dizziness and giddiness: Secondary | ICD-10-CM

## 2012-11-14 DIAGNOSIS — R0902 Hypoxemia: Secondary | ICD-10-CM

## 2012-11-14 DIAGNOSIS — R0989 Other specified symptoms and signs involving the circulatory and respiratory systems: Secondary | ICD-10-CM

## 2012-11-14 DIAGNOSIS — J683 Other acute and subacute respiratory conditions due to chemicals, gases, fumes and vapors: Secondary | ICD-10-CM | POA: Diagnosis present

## 2012-11-14 DIAGNOSIS — J09X2 Influenza due to identified novel influenza A virus with other respiratory manifestations: Principal | ICD-10-CM | POA: Insufficient documentation

## 2012-11-14 DIAGNOSIS — R059 Cough, unspecified: Secondary | ICD-10-CM

## 2012-11-14 DIAGNOSIS — R05 Cough: Secondary | ICD-10-CM

## 2012-11-14 DIAGNOSIS — I10 Essential (primary) hypertension: Secondary | ICD-10-CM | POA: Diagnosis present

## 2012-11-14 DIAGNOSIS — R232 Flushing: Secondary | ICD-10-CM

## 2012-11-14 DIAGNOSIS — H6690 Otitis media, unspecified, unspecified ear: Secondary | ICD-10-CM | POA: Diagnosis present

## 2012-11-14 DIAGNOSIS — J309 Allergic rhinitis, unspecified: Secondary | ICD-10-CM

## 2012-11-14 DIAGNOSIS — R0981 Nasal congestion: Secondary | ICD-10-CM

## 2012-11-14 DIAGNOSIS — M329 Systemic lupus erythematosus, unspecified: Secondary | ICD-10-CM | POA: Diagnosis present

## 2012-11-14 DIAGNOSIS — J101 Influenza due to other identified influenza virus with other respiratory manifestations: Secondary | ICD-10-CM | POA: Diagnosis present

## 2012-11-14 DIAGNOSIS — J111 Influenza due to unidentified influenza virus with other respiratory manifestations: Secondary | ICD-10-CM

## 2012-11-14 DIAGNOSIS — G4700 Insomnia, unspecified: Secondary | ICD-10-CM | POA: Diagnosis present

## 2012-11-14 DIAGNOSIS — K219 Gastro-esophageal reflux disease without esophagitis: Secondary | ICD-10-CM | POA: Diagnosis present

## 2012-11-14 DIAGNOSIS — J45909 Unspecified asthma, uncomplicated: Secondary | ICD-10-CM | POA: Insufficient documentation

## 2012-11-14 DIAGNOSIS — M3500 Sicca syndrome, unspecified: Secondary | ICD-10-CM | POA: Diagnosis present

## 2012-11-14 DIAGNOSIS — F341 Dysthymic disorder: Secondary | ICD-10-CM | POA: Insufficient documentation

## 2012-11-14 DIAGNOSIS — F418 Other specified anxiety disorders: Secondary | ICD-10-CM | POA: Diagnosis present

## 2012-11-14 LAB — CBC
Hemoglobin: 12.6 g/dL (ref 12.0–15.0)
MCHC: 34.6 g/dL (ref 30.0–36.0)

## 2012-11-14 LAB — POCT CBC
Hemoglobin: 13.6 g/dL (ref 12.2–16.2)
Lymph, poc: 1 (ref 0.6–3.4)
MCHC: 32.3 g/dL (ref 31.8–35.4)
MPV: 10 fL (ref 0–99.8)
POC Granulocyte: 4.8 (ref 2–6.9)
POC MID %: 7.1 %M (ref 0–12)
RDW, POC: 15.3 %

## 2012-11-14 LAB — BASIC METABOLIC PANEL
BUN: 7 mg/dL (ref 6–23)
Creatinine, Ser: 0.75 mg/dL (ref 0.50–1.10)
GFR calc Af Amer: >90 mL/min (ref >90)
GFR calc non Af Amer: >90 mL/min (ref >90)
Potassium: 4.4 mEq/L (ref 3.5–5.1)

## 2012-11-14 LAB — CREATININE, SERUM
Creatinine, Ser: 0.79 mg/dL (ref 0.50–1.10)
GFR calc non Af Amer: >90 mL/min (ref >90)

## 2012-11-14 MED ORDER — ONDANSETRON HCL 4 MG PO TABS
4.0000 mg | ORAL_TABLET | Freq: Four times a day (QID) | ORAL | Status: DC | PRN
  Administered 2012-11-14 – 2012-11-15 (×2): 4 mg via ORAL
  Filled 2012-11-14 (×2): qty 1

## 2012-11-14 MED ORDER — DIVALPROEX SODIUM 500 MG PO DR TAB
500.0000 mg | DELAYED_RELEASE_TABLET | Freq: Every day | ORAL | Status: DC
  Administered 2012-11-14 – 2012-11-15 (×2): 500 mg via ORAL
  Filled 2012-11-14 (×3): qty 1

## 2012-11-14 MED ORDER — ONDANSETRON HCL 4 MG/2ML IJ SOLN
4.0000 mg | Freq: Four times a day (QID) | INTRAMUSCULAR | Status: DC | PRN
  Administered 2012-11-15: 4 mg via INTRAVENOUS
  Filled 2012-11-14: qty 2

## 2012-11-14 MED ORDER — ALBUTEROL SULFATE (5 MG/ML) 0.5% IN NEBU
2.5000 mg | INHALATION_SOLUTION | RESPIRATORY_TRACT | Status: DC | PRN
  Administered 2012-11-14 – 2012-11-15 (×7): 2.5 mg via RESPIRATORY_TRACT
  Filled 2012-11-14 (×4): qty 0.5

## 2012-11-14 MED ORDER — ESTRADIOL 1 MG PO TABS
1.0000 mg | ORAL_TABLET | Freq: Every day | ORAL | Status: DC
  Administered 2012-11-15 – 2012-11-16 (×2): 1 mg via ORAL
  Filled 2012-11-14 (×2): qty 1

## 2012-11-14 MED ORDER — HYDROCODONE-HOMATROPINE 5-1.5 MG/5ML PO SYRP
5.0000 mL | ORAL_SOLUTION | ORAL | Status: DC | PRN
  Administered 2012-11-14 – 2012-11-15 (×5): 5 mL via ORAL
  Filled 2012-11-14 (×6): qty 5

## 2012-11-14 MED ORDER — AZATHIOPRINE 50 MG PO TABS
50.0000 mg | ORAL_TABLET | Freq: Two times a day (BID) | ORAL | Status: DC
  Filled 2012-11-14 (×5): qty 1

## 2012-11-14 MED ORDER — FLUTICASONE PROPIONATE 50 MCG/ACT NA SUSP
2.0000 | Freq: Every day | NASAL | Status: DC
  Administered 2012-11-14 – 2012-11-15 (×2): 2 via NASAL
  Filled 2012-11-14: qty 16

## 2012-11-14 MED ORDER — LORAZEPAM 1 MG PO TABS
1.0000 mg | ORAL_TABLET | Freq: Two times a day (BID) | ORAL | Status: DC
  Administered 2012-11-16: 1 mg via ORAL
  Filled 2012-11-14 (×2): qty 1

## 2012-11-14 MED ORDER — ALBUTEROL SULFATE (5 MG/ML) 0.5% IN NEBU
INHALATION_SOLUTION | RESPIRATORY_TRACT | Status: AC
  Filled 2012-11-14: qty 0.5

## 2012-11-14 MED ORDER — HYDROCHLOROTHIAZIDE 25 MG PO TABS
12.5000 mg | ORAL_TABLET | Freq: Every day | ORAL | Status: DC
  Administered 2012-11-15: 12.5 mg via ORAL
  Filled 2012-11-14 (×3): qty 0.5

## 2012-11-14 MED ORDER — ACETAMINOPHEN 325 MG PO TABS
650.0000 mg | ORAL_TABLET | Freq: Four times a day (QID) | ORAL | Status: DC | PRN
  Administered 2012-11-14 – 2012-11-15 (×4): 650 mg via ORAL
  Filled 2012-11-14 (×5): qty 2

## 2012-11-14 MED ORDER — OSELTAMIVIR PHOSPHATE 75 MG PO CAPS
75.0000 mg | ORAL_CAPSULE | Freq: Two times a day (BID) | ORAL | Status: DC
  Administered 2012-11-14 – 2012-11-16 (×4): 75 mg via ORAL
  Filled 2012-11-14 (×5): qty 1

## 2012-11-14 MED ORDER — SODIUM CHLORIDE 0.9 % IV BOLUS (SEPSIS)
1000.0000 mL | Freq: Once | INTRAVENOUS | Status: AC
  Administered 2012-11-14: 1000 mL via INTRAVENOUS

## 2012-11-14 MED ORDER — ALBUTEROL SULFATE HFA 108 (90 BASE) MCG/ACT IN AERS
2.0000 | INHALATION_SPRAY | Freq: Four times a day (QID) | RESPIRATORY_TRACT | Status: DC | PRN
  Filled 2012-11-14: qty 6.7

## 2012-11-14 MED ORDER — LOSARTAN POTASSIUM 50 MG PO TABS
100.0000 mg | ORAL_TABLET | Freq: Every day | ORAL | Status: DC
  Administered 2012-11-15 – 2012-11-16 (×2): 100 mg via ORAL
  Filled 2012-11-14 (×2): qty 2

## 2012-11-14 MED ORDER — ZOLPIDEM TARTRATE 5 MG PO TABS: 10.0000 mg | ORAL_TABLET | Freq: Every evening | ORAL | Status: DC | PRN

## 2012-11-14 MED ORDER — SODIUM CHLORIDE 0.9 % IV SOLN
INTRAVENOUS | Status: DC
  Administered 2012-11-15: 125 mL/h via INTRAVENOUS
  Administered 2012-11-16: 1000 mL via INTRAVENOUS

## 2012-11-14 MED ORDER — ACETAMINOPHEN 650 MG RE SUPP: 650.0000 mg | Freq: Four times a day (QID) | RECTAL | Status: DC | PRN

## 2012-11-14 MED ORDER — HEPARIN SODIUM (PORCINE) 5000 UNIT/ML IJ SOLN
5000.0000 [IU] | Freq: Three times a day (TID) | INTRAMUSCULAR | Status: DC
  Administered 2012-11-14 – 2012-11-16 (×6): 5000 [IU] via SUBCUTANEOUS
  Filled 2012-11-14 (×9): qty 1

## 2012-11-14 MED ORDER — POTASSIUM CITRATE ER 10 MEQ (1080 MG) PO TBCR
10.0000 meq | EXTENDED_RELEASE_TABLET | Freq: Every day | ORAL | Status: DC
  Filled 2012-11-14 (×2): qty 1

## 2012-11-14 NOTE — Progress Notes (Signed)
Difficult IV stick per pt. -requests IV team.

## 2012-11-14 NOTE — Progress Notes (Signed)
  Subjective:    Patient ID: Abigail [name], female    DOB: Nov 01, 1961, 52 y.o.   MRN: [id number]  HPI Patient comes into day with complaints of a continuous cough that last for about 20 minutes at a time with yellow phlegm. She also is short of breath just by sitting around doing nothing.When she blow her nose she blows out yellowish mucus with some blood in it. Yesterday she was vomiting and the Zofran that she had gotten didn't help none.Patient also states that she had gotten in the shower to help loosen some of her sinuses and congestion in her chest it help some but symptoms returned after she got out of shower. She was seen recently and DX with the flu   Review of Systems     Objective:   Physical Exam patient is ill but not toxic. She has some irritation to the conjunctiva bilaterally. Her nose is significantly congested. Throat is clear lungs are clear to auscultation. Her cardiac exam reveals tachycardia when I walked her down the hall pulse ox decreased to 91% Results for orders placed in visit on 11/14/12  POCT CBC      Component Value Range   WBC 6.2  4.6 - 10.2 K/uL   Lymph, poc 1.0  0.6 - 3.4   POC LYMPH PERCENT 15.4  10 - 50 %L   MID (cbc) 0.4  0 - 0.9   POC MID % 7.1  0 - 12 %M   POC Granulocyte 4.8  2 - 6.9   Granulocyte percent 77.5  37 - 80 %G   RBC 4.87  4.04 - 5.48 M/uL   Hemoglobin 13.6  12.2 - 16.2 g/dL   HCT, POC 08.6  57.8 - 47.9 %   MCV 86.4  80 - 97 fL   MCH, POC 27.9  27 - 31.2 pg   MCHC 32.3  31.8 - 35.4 g/dL   RDW, POC 46.9     Platelet Count, POC 247  142 - 424 K/uL   MPV 10.0  0 - 99.8 fL   [facility] reading (PRIMARY) by  Dr. [name] there is no pneumonia seen. Waters' view is normal        Assessment & Plan:    Patient here with flu a. She is having increasing difficulty with cough and decrease in her pulse ox.. 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

## 2012-11-14 NOTE — Progress Notes (Addendum)
Entered in Error

## 2012-11-14 NOTE — Patient Instructions (Addendum)
Go straight to admitting. Therefore page Dr. Ermalinda Memos [phone]

## 2012-11-15 LAB — BASIC METABOLIC PANEL
GFR calc Af Amer: >90 mL/min (ref >90)
GFR calc non Af Amer: >90 mL/min (ref >90)
Potassium: 3.8 mEq/L (ref 3.5–5.1)
Sodium: 137 mEq/L (ref 135–145)

## 2012-11-15 LAB — CBC
Hemoglobin: 12.1 g/dL (ref 12.0–15.0)
MCHC: 33.3 g/dL (ref 30.0–36.0)
Platelets: 192 10*3/uL (ref 150–400)
RDW: 13.8 % (ref 11.5–15.5)

## 2012-11-15 MED ORDER — HYDROCODONE-HOMATROPINE 5-1.5 MG/5ML PO SYRP
5.0000 mL | ORAL_SOLUTION | ORAL | Status: DC
  Administered 2012-11-15 – 2012-11-16 (×6): 5 mL via ORAL
  Filled 2012-11-15 (×6): qty 5

## 2012-11-15 MED ORDER — AMOXICILLIN 500 MG PO CAPS
500.0000 mg | ORAL_CAPSULE | Freq: Two times a day (BID) | ORAL | Status: DC
  Administered 2012-11-16 (×2): 500 mg via ORAL
  Filled 2012-11-15 (×4): qty 1

## 2012-11-15 MED ORDER — DOCUSATE SODIUM 100 MG PO CAPS
100.0000 mg | ORAL_CAPSULE | Freq: Two times a day (BID) | ORAL | Status: DC | PRN
  Administered 2012-11-16: 100 mg via ORAL
  Filled 2012-11-15: qty 1

## 2012-11-15 NOTE — Progress Notes (Signed)
Pt re-evaluated at bedside this afternoon. Still with many subjective complaints; new ear pain, mostly on right, though pt states Afrin nasal spray helped considerably with ear pain and congestion. Still with desats setting off O2 monitor alarm (89%), when on room air alone. Pt also complaining of bilateral eye redness/itching and "gunkiness." Pt has brought her home CPAP machine in. She states she is not necessarily uncomfortable with going home, but states that she is concerned about how she will feel and is very anxious about her O2 sats going up and down.  On exam, pt remains ill-appearing with a slightly anxious affect and also appears very tired. O2 sats on room air mostly in 92-95 range, though some desats with talking that improve with coughing/deep breathing. Bilateral TM's are red, also with some redness of external ear canals.  Subjective pain worse on right side until otoscope speculum inserted into left ear; then pain worse on left  Left TM erythema slightly worse than right, also with mild-moderate bulging.  Plan:  Will monitor overnight and continue Tamiflu and supportive care with IVF, Tylenol for fevers, etc.  Ordered for pt to use home CPAP machine/settings. Will also order incentive spirometry while awake.  TM appearance likely reflects superimposed viral otitis media, but will cover with amoxicillin 500 mg q12.  Discussed with Dr. Sheffield Slider and pt's nurse.  Bobbye Morton, MD PGY-1, [facility] Family Medicine FPTS Intern pager: [phone]

## 2012-11-15 NOTE — Progress Notes (Signed)
Utilization Review Completed.Abigail [name] T1/03/2013   

## 2012-11-15 NOTE — Progress Notes (Signed)
I examined this patient and discussed the care plan with Dr Casper Harrison and the [facility] team and agree with assessment and plan as documented in the progress note above. Currently antibiotics other than the Tamiflu are not indicated.

## 2012-11-15 NOTE — Progress Notes (Signed)
Family Medicine Teaching Service Daily Progress Note Intern Pager: [phone]  Patient name: Abigail [name] Medical record number: [id number] Date of birth: Apr 05, 1961 Age: 52 y.o. Gender: female  Primary Care Provider: Lucilla Edin, MD  Subjective: Pt seen at bedside this morning. Per pt and night RN, pt had significant productive cough overnight and required breathing treatments x2. Pt still subjectively feels very poorly and is very concerned about her O2 sats (even at 90-92 on room air). States she is still having fevers off and on and her cough in particular bothers her. Otherwise denies N/V or chest/abdominal pain.  Objective: Temp:  [99 F (37.2 C)-100 F (37.8 C)] 99.2 F (37.3 C) (01/05 0721) Pulse Rate:  [85-100] 100  (01/05 0721) Resp:  [18-20] 18  (01/05 0721) BP: (122-132)/(69-74) 122/69 mmHg (01/05 0721) SpO2:  [94 %-100 %] 99 % (01/05 0721) Exam: General: adult female, appears ill but in NAD, cooperative with exam Cardiovascular: slightly increased rate but regular rhythm, no murmur appreciated Respiratory: CTAB, good air movement bilaterally but significant cough especially with deep breathing Abdomen: soft, nontender, BS+ Extremities: moves extremities equally/spontaneously, warm/well perfused, no LE edema  Laboratory:  Lab 11/15/12 0550 11/14/12 1216 11/14/12 0828  WBC 4.9 6.1 6.2  HGB 12.1 12.6 13.6  HCT 36.3 36.4 42.1  PLT 192 200 --    Lab 11/15/12 0550 11/14/12 1600 11/14/12 1216  NA 137 132* --  K 3.8 4.4 --  CL 101 94* --  CO2 25 28 --  BUN 7 7 --  CREATININE 0.79 0.75 0.79  CALCIUM 8.4 9.0 --  PROT -- -- --  BILITOT -- -- --  ALKPHOS -- -- --  ALT -- -- --  AST -- -- --  GLUCOSE 109* 109* --   Imaging/Diagnostic Tests: CXR, 1/4 @0900  Findings: The heart size is normal. Both lungs are clear. No  evidence of pleural effusion. No mass or lymphadenopathy  identified. Cervical spine fusion hardware again noted.  IMPRESSION:  No active  cardiopulmonary disease.  Sinus xray, 1/4 @0900  Findings: Water's view shows no evidence of sinus air fluid levels.  No definite mucosal thickening seen. No bone abnormality  identified. Tiny radiodensities seen projecting over the inferior  right orbit. This may represent artifact however tiny metallic  radiopaque foreign body cannot be excluded.  IMPRESSION:  1. No radiographic evidence of sinusitis.  2. Question tiny right orbital metallic foreign body versus  artifact.  Assessment and Plan: Khara Renaud is a 52 y/o female with PMH of SLE, Sjogren's syndrome, HTN, Asthma and depression presenting with uncontrollable cough and dyspnea, desatting to the low 90's while ambulating at urgent care. Positive for influenza A at urgent care. Admitted 1/4.  #Cough and Dyspnea: Likely due to influenza A as above, possibly exacerbated by relatively immunosuppressed state (on treatment for SLE, Sjogren's, as below). Low-grade elevated temps, WBC WNL, mild tachycardia (likely from mild distress). CXR without consolidation or other suspicious features.  - Still with subjective complaints, though O2 sats >92 on RA this morning. - Continue Tamiflu; with productive cough, will consider doxycycline for empiric sinusitis/atypical PNA coverage - PRN humidified O2 to keep sats < 90% with weaning as tolerated, PRN albuterol nebs - Changed Hycodan PRN to scheduled for cough, Tylenol for fever/headache - Otherwise supportive care. Continue to monitor fever curve and O2 requirement .  #Asthma: no wheezing and non-labored on exam, likely at baseline.  - will monitor closely as flu may exacerbate symptoms; will consider short course of  steroids - PRN albuterol nebs as above  #SLE, Sjogren's: Current flare per of sjogren's per patient.  - ESR 50 on 11/13/2012  - Will continue imuran and monitor for need of systemic steroids.   #HTN: Well controlled on home meds - continue home HCTZ, Losartan; monitor    #Depression with Anxiety: Appears to be at baseline. Anxiety possibly contributing to subjective SOB with good O2 sats. - Continue home ativan and depakote, monitor symptoms  FEN/GI: Heart healthy diet, IVF NS at 125 mL/h PPx: subQ heparin Dispo: Management as above. Possible D/C home today. Code Status: Full  Delquan Poucher, Cristal Deer, MD 11/15/2012, 10:46 AM

## 2012-11-15 NOTE — H&P (Signed)
I examined this patient and discussed the care plan with Merrell and the FPTS team and agree with assessment and plan as documented in the admission note above.  

## 2012-11-15 NOTE — H&P (Signed)
Family Medicine Teaching [facility] Admission History and Physical Service Pager: [phone]  Patient name: Abigail [name] Medical record number: [id number] Date of birth: 1961-06-01 Age: 52 y.o. Gender: female  Primary Care Provider: Lucilla Edin, MD  Chief Complaint: cough and dsypnea  History of Present Illness: Abigail [name] is a 52 y.o. year old female presenting with flu from [facility]. She started 4 days ago with a severe sore throat. The following day she developed a cough that have progressed to uncontrollable coughing fits causing dyspnea. Since that time she has had weakness, frontal headache, light and noise sensitivity, and congestion. She visited urgent care on 1/3 where she was given IVF with some improvement and started on tamiflu. Overnight she worsened, had multiple epsiodes of emesis (NBNB), and became very weak. At home she has been using albuterol, tussinex, nasal saline, tylenol, motrin, and a humidifier. She has had decreased PO intake over the last 2-3  Days only able to keep down crackers, water, and some coca cola. She denies rash, dysuria, diarrhea or constipation, and her usual migraine headache.   Of note she has multiple collagen vascular diseases, specifically Lupus, Sjogren's Syndrome, And autoimmune vasculitis, for which she is on chronic immunosuppression therapy with imuran (which she notes she hasn't taken for three days). She is not having any known flares except for some facial swelling over her salivary glands that is causing facial pain, which she comments she has occasionally. Further she states that her usual WBC is 3-4, due to imuran use, and is elevated for her at 6.    Patient Active Problem List  Diagnosis  . GERD  . DIVERTICULITIS, COLON  . SJOGREN'S SYNDROME  . CHOLECYSTECTOMY, HX OF  . Reactive airways  . Diverticulitis of large intestine with perforation  . ADD (attention deficit disorder)  . Depression with anxiety  . Insomnia    . Hypertension  . Depression  . HTN (hypertension)  . SLE (systemic lupus erythematosus)  . Dysplastic nevus  . Melanoma  . Menopause  . Elevated LFTs  . Hypokalemia  . Palpitations  . Essential hypertension, benign  . Dizziness   Past Medical History: Past Medical History  Diagnosis Date  . Lupus   . Migraine   . Complication of anesthesia     Sjogrens Syndrome  . Sjogren's syndrome   . Shortness of breath     "related to the asthma"  . Vasculitis     "autoimmune"  . Diverticulitis   . Gastritis   . Duodenitis   . Pancreatitis   . Fibromyalgia   . Perforation bowel     "microperforation"  . Asthma   . Melanoma 2008    Back; [place].  . Depression   . GERD (gastroesophageal reflux disease)   . Hypertension   . Urticaria    Past Surgical History: Past Surgical History  Procedure Date  . Melanoma excision   . Tmj arthroplasty 1970  . Appendectomy   . Cholecystectomy   . Pancreas surgery     sphincterotomy  . Abdominoplasty   . Liver biopsy   . Breast surgery   . Anterior cervical decomp/discectomy fusion 10/29/2011    Procedure: ANTERIOR CERVICAL DECOMPRESSION/DISCECTOMY FUSION 2 LEVELS;  Surgeon: Karn Cassis;  Location: [facility];  Service: Neurosurgery;  Laterality: N/A;  Cervical five-six,Cervical six-seven nterior cervical decompression/diskectomy, fusion, plate  . Sigmoidectomy 03/2012    done at [facility]  . Abdominal hysterectomy     Partial; Ovaries intact.  DUB/fibroids.  . Colonoscopy 11/11/2008   Social History: History  Substance Use Topics  . Smoking status: Never Smoker   . Smokeless tobacco: Never Used  . Alcohol Use: Yes     Comment: 01/20/12 once/month   For any additional social history documentation, please refer to relevant sections of EMR.  Family History: Family History  Problem Relation Age of Onset  . Colon cancer Paternal Uncle   . Colon polyps Sister   . Depression Sister     suicide  . Colon polyps Brother   .  Colon polyps Mother   . Dementia Mother   . Heart disease Father 48    CHF  . Macular degeneration Father    Allergies: Allergies  Allergen Reactions  . Meperidine Hcl Other (See Comments)    tachycardia  . Shrimp (Shellfish Allergy) Shortness Of Breath  . Doxycycline Hives  . Latex Other (See Comments)    Breathing problems.  . Promethazine Hcl Other (See Comments)    "uncontrolled vomiting"  . Sulfonamide Derivatives Hives  . Tetracyclines & Related Rash   No current facility-administered medications on file prior to encounter.   Current Outpatient Prescriptions on File Prior to Encounter  Medication Sig Dispense Refill  . albuterol (PROVENTIL HFA;VENTOLIN HFA) 108 (90 BASE) MCG/ACT inhaler Inhale 2 puffs into the lungs every 6 (six) hours as needed. For shortness of breath.  1 Inhaler  2  . amphetamine-dextroamphetamine (ADDERALL) 10 MG tablet Take 1 tablet (10 mg total) by mouth 2 (two) times daily.  60 tablet  0  . azaTHIOprine (IMURAN) 50 MG tablet Take 50 mg by mouth 2 (two) times daily.       Marland Kitchen azelastine (ASTELIN) 137 MCG/SPRAY nasal spray Place 2 sprays into the nose daily as needed. For stuffy nose.  30 mL  6  . chlorpheniramine-carbetapentane (TUSSI-12) 4-30 MG/5ML SUSP Take by mouth every 12 (twelve) hours as needed.      . divalproex (DEPAKOTE) 500 MG DR tablet Take 1 tablet (500 mg total) by mouth daily.  180 tablet  2  . estradiol (ESTRACE) 1 MG tablet Take 1 tablet (1 mg total) by mouth daily.  30 tablet  5  . fluticasone (FLONASE) 50 MCG/ACT nasal spray Place 2 sprays into the nose daily.  16 g  6  . hydrochlorothiazide (HYDRODIURIL) 12.5 MG tablet Take 1 tablet (12.5 mg total) by mouth daily.  90 tablet  3  . HYDROcodone-homatropine (HYCODAN) 5-1.5 MG/5ML syrup Take 5 mLs by mouth every 8 (eight) hours as needed for cough.  120 mL  0  . LORazepam (ATIVAN) 1 MG tablet 1 twice daily as needed  60 tablet  3  . losartan (COZAAR) 100 MG tablet Take 1 tablet (100 mg  total) by mouth daily.  90 tablet  3  . ondansetron (ZOFRAN ODT) 4 MG disintegrating tablet Take 1-2 tablets (4-8 mg total) by mouth every 8 (eight) hours as needed for nausea.  20 tablet  0  . oseltamivir (TAMIFLU) 75 MG capsule Take 1 capsule (75 mg total) by mouth 2 (two) times daily.  10 capsule  0  . potassium citrate (UROCIT-K) 10 MEQ (1080 MG) SR tablet Take 10 mEq by mouth daily.      Marland Kitchen tretinoin (RETIN-A) 0.1 % cream Apply topically at bedtime.  45 g  0  . zolpidem (AMBIEN) 5 MG tablet Take 10 mg by mouth at bedtime as needed. Patient has 10 mg tablets. Cuts these in half.  Review Of Systems: Per HPI, Otherwise 12 point review of systems was performed and was unremarkable.  Physical Exam: BP 123/73  Pulse 99  Temp 99.9 F (37.7 C) (Oral)  Resp 20  SpO2 94%  Exam: Gen: NAD, alert, cooperative with exam, ill appearing with deep cough HEENT: NCAT, EOMI, PERRL, R sided maxillary sinus pain with palpation, tachy mucous membranes, no cervical lymphadenopathy or parotid gland tenderness on palpation CV: RRR, good S1/S2, no murmur Resp: CTABL, no wheezes, non-labored Abd: SNTND, BS present, no guarding or organomegaly Ext: No edema, 2+ DP pulses Neuro: Alert and oriented, No gross deficits Skin: No rash on face, lower legs, arms.    Labs and Imaging: CBC BMET   Lab 11/14/12 1216  WBC 6.1  HGB 12.6  HCT 36.4  PLT 200   No results found for this basename: NA,K,CL,CO2,BUN,CREATININE,GLUCOSE,CALCIUM in the last 168 hours  ESR-50 11/13/2012  DG Sinuses 11/14/2012 IMPRESSION:  1. No radiographic evidence of sinusitis.  2. Question tiny right orbital metallic foreign body versus  artifact.  DG Chest 2 view 11/14/2012 IMPRESSION:  No active cardiopulmonary disease.  A/P Markel Mergenthaler is a 52 y/o female with PMH of SLE, Sjogren's syndrome, HTN, Asthma and depression presenting with uncontrollable cough and dyspnea, desatting to the low 90's while ambulating at urgent care. As  she tested positive for influenza A at urgent care I believe these symptoms can be easily attributed to the flu. It's likely that her coarse is more severe as she is immunosuppressed and so we will treat her supportively and monitor her for worsening respiratory status and comorbidity exacerbation.    Cough and Dyspnea: Likely due to influenza A as above, exacerbated by possible immunosuppressed state. Afebrile currently, WBC WNL, mild tachycardia (likely from mild distress). CXR without consolidation or other suspicious features. - PRN humidified O2 to keep sats < 90%, PRN albuterol nebs - Hycodan for cough, continue tamiflu - monitor fever curve and O2 requirement  Asthma: no wheezing and non-labored on exam, likely at baseline.  - will monitor closely as flu can easily exacerbate - PRN albuterol nebs as above, monitor  SLE, Sjogren's: Current flare per of sjogren's per patient.  - ESR 50 on 11/13/2012 - Will continue imuran and monitor for need of systemic steroids.   HTN: Well controlled today - continue home HCTZ, Losartan - Monitor   Depression with Anxiety: Appears to be at baseline - Continue home ativan and depakote - Monitor  FEN/GI: Tolerating little PO - 1L NS bolus  - heart healthy diet - IV NS at 123mL/hr PPx: subq Heparin Dispo: Med surg, home when symptomatically improved.    Kevin Fenton, MD 11/14/2012, 12:51 PM  --------------------------------------------------------  Upper Level Addendum  Pt seen and examined w/ Dr. Ermalinda Memos. Agree w/ HPI, PE, and A/P as outlined above w/ additional comments made in blue  Shelly Flatten, MD Family Medicine PGY-2 11/14/2012, 5:50 PM

## 2012-11-16 MED ORDER — AMOXICILLIN 500 MG PO CAPS: 500.0000 mg | ORAL_CAPSULE | Freq: Two times a day (BID) | ORAL | Status: DC

## 2012-11-16 MED ORDER — HYDROCHLOROTHIAZIDE 12.5 MG PO CAPS
12.5000 mg | ORAL_CAPSULE | Freq: Every day | ORAL | Status: DC
  Filled 2012-11-16: qty 1

## 2012-11-16 MED ORDER — DSS 100 MG PO CAPS: 100.0000 mg | ORAL_CAPSULE | Freq: Two times a day (BID) | ORAL | Status: DC | PRN

## 2012-11-16 MED ORDER — ACETAMINOPHEN 325 MG PO TABS: 650.0000 mg | ORAL_TABLET | Freq: Four times a day (QID) | ORAL | Status: DC | PRN

## 2012-11-16 MED ORDER — ALBUTEROL SULFATE (5 MG/ML) 0.5% IN NEBU: 2.5000 mg | INHALATION_SOLUTION | RESPIRATORY_TRACT | Status: DC | PRN

## 2012-11-16 NOTE — Progress Notes (Signed)
Case discussed with resident team, and I agree with plan for discharge for today.  Paula Compton, MD

## 2012-11-16 NOTE — Progress Notes (Signed)
CARE MANAGEMENT NOTE 11/16/2012  Patient:  Abigail [name], Abigail [name]   Account Number:  [id number]  Date Initiated:  11/16/2012  Documentation initiated by:  Vance Peper  Subjective/Objective Assessment:   52 yr old female admited with influenza.     Action/Plan:   CM spoke with patient concerning her request for oxygen and a hospital bed. Patient doesnt meet guidelines for either.She states her husband will buy a bed.  sats are in the 90's, doesnt qualify for oxygen.Given private duty list for Aide.   Anticipated DC Date:  11/16/2012   Anticipated DC Plan:  HOME/SELF CARE         Choice offered to / List presented to:     DME arranged  NEBULIZER/MEDS  NEBULIZER MACHINE      DME agency  [facility]        Status of service:  Completed, signed off Medicare Important Message given?   (If response is "NO", the following Medicare IM given date fields will be blank) Date Medicare IM given:   Date Additional Medicare IM given:    Discharge Disposition:  HOME/SELF CARE  Per UR Regulation:    If discussed at Long Length of Stay Meetings, dates discussed:    Comments:

## 2012-11-16 NOTE — Progress Notes (Signed)
Family Medicine Teaching Service Daily Progress Note Intern Pager: [phone]  Patient name: Abigail [name] Medical record number: [id number] Date of birth: [birth year] Age: 51 y.o. Gender: female  Primary Care Provider: Lucilla Edin, MD  Subjective: Pt seen at bedside this morning. Per pt and night RN, pt had another "rough night," many desats to 88-89. Pt states, "my CPAP wasn't set up, I didn't sleep." Pt states she wants to go home and requests home O2, a nebulizer machine, and a hospital bed. She states she wonders why she is "just on amoxicillin for her ears given her history of extensive infections and really big antibiotics." She remains very concerned about her O2 sats and respiratory status.  Objective: Temp:  [99.7 F (37.6 C)-102.2 F (39 C)] 99.7 F (37.6 C) (01/06 0721) Pulse Rate:  [93-103] 94  (01/06 0721) Resp:  [18] 18  (01/06 0721) BP: (129-137)/(71-76) 137/76 mmHg (01/06 0721) SpO2:  [97 %-99 %] 99 % (01/06 0721) Exam: General: adult female, appears ill but in NAD, cooperative with exam Cardiovascular: slightly increased rate but regular rhythm, no murmur appreciated Respiratory: some scattered coarse breath sounds, good air movement bilaterally but significant cough especially with deep breathing; generally unchanged Abdomen: soft, nontender, BS+ Extremities: moves extremities equally/spontaneously, warm/well perfused, no LE edema  Laboratory:  Lab 11/15/12 0550 11/14/12 1216 11/14/12 0828  WBC 4.9 6.1 6.2  HGB 12.1 12.6 13.6  HCT 36.3 36.4 42.1  PLT 192 200 --    Lab 11/15/12 0550 11/14/12 1600 11/14/12 1216  NA 137 132* --  K 3.8 4.4 --  CL 101 94* --  CO2 25 28 --  BUN 7 7 --  CREATININE 0.79 0.75 0.79  CALCIUM 8.4 9.0 --  PROT -- -- --  BILITOT -- -- --  ALKPHOS -- -- --  ALT -- -- --  AST -- -- --  GLUCOSE 109* 109* --   Imaging/Diagnostic Tests: CXR, 1/4 @0900  Findings: The heart size is normal. Both lungs are clear. No  evidence of pleural  effusion. No mass or lymphadenopathy  identified. Cervical spine fusion hardware again noted.  IMPRESSION:  No active cardiopulmonary disease.  Sinus xray, 1/4 @0900  Findings: Water's view shows no evidence of sinus air fluid levels.  No definite mucosal thickening seen. No bone abnormality  identified. Tiny radiodensities seen projecting over the inferior  right orbit. This may represent artifact however tiny metallic  radiopaque foreign body cannot be excluded.  IMPRESSION:  1. No radiographic evidence of sinusitis.  2. Question tiny right orbital metallic foreign body versus  artifact.  Assessment and Plan: Abigail [name] is a 52 y/o female with PMH of SLE, Sjogren's syndrome, HTN, Asthma and depression presenting with uncontrollable cough and dyspnea, desatting to the low 90's while ambulating at urgent care. Positive for influenza A at urgent care. Admitted 1/4.  #Cough and Dyspnea: Likely due to influenza A as above, possibly exacerbated by relatively immunosuppressed state (on treatment for SLE, Sjogren's, as below). Low-grade elevated temps, one fever overnight 1/5-6 to 102 improved with Tylenol. WBC WNL, mild tachycardia (likely from mild distress). CXR without consolidation or other suspicious features.  - Still with many subjective complaints and medication/equipment requests per subjective  -discussed with case management  -orders placed for home O2, neb machine, and hospital bed; pt to possibly pay out of pocket - Continue Tamiflu, amoxicillin for presumptive otitis media - PRN humidified O2 to keep sats < 90% with weaning as tolerated, PRN albuterol nebs - Changed  Hycodan PRN to scheduled for cough, Tylenol for fever/headache - Otherwise supportive care. Continue to monitor fever curve and O2 requirement .  #Asthma: no wheezing and non-labored on exam, likely at baseline.  - will monitor closely as flu may exacerbate symptoms; considered short burst of steroids, but no  wheezing on exam - PRN albuterol nebs as above - home nebulizer machine ordered, as above; will prescribe home nebulizer medication  #SLE, Sjogren's: Current flare per of sjogren's per patient.  - ESR 50 on 11/13/2012  - Will continue imuran and monitor for need of systemic steroids.   #HTN: Well controlled on home meds - continue home HCTZ, Losartan; monitor   #Depression with Anxiety: Appears to be at baseline. Anxiety possibly contributing to subjective SOB with good O2 sats. - Continue home ativan and depakote, monitor symptoms  FEN/GI: Heart healthy diet, IVF NS at 125 mL/h PPx: subQ heparin Dispo: Management as above. Discharge home today. Code Status: Full  Kevyn Wengert, Cristal Deer, MD 11/16/2012, 8:25 AM

## 2012-11-16 NOTE — Progress Notes (Signed)
Patient did not wear CPAP during the night.

## 2012-11-16 NOTE — Discharge Summary (Signed)
Family Medicine Teaching [facility] Discharge Summary  Patient name: Abigail [name] Medical record number: [id number] Date of birth: 10-06-1961 Age: 52 y.o. Gender: female Date of Admission: 11/14/2012  Date of Discharge: 1/6 Admitting Physician: Tobin Chad, MD  Primary Care Provider: Lucilla Edin, MD  Indication for Hospitalization: shortness of breath, dyspnea/hypoxia, and positive outpt rapid flu test Discharge Diagnoses:  Influenza A Acute otitis media Reactive airways Sjogren's syndrome SLE HTN GERD Depression with anxiety Insomnia  Consultations: none  Significant Labs and Imaging:  Rapid flu: positive for influenza A (POC, prior to admission) Na 132 > 137 K 4.4 > 3.8 Cl 94 > 101 CO2 28 > 25 BUN 7 > 7 Cr 0.75 > 0.79 Ca 9.0 > 8.4 Glu 109 > 109 WBC 6.1 > 4.9 Hb 12.6 > 12.1 Plt 200 > 192  CXR, 1/4 @0900  Findings: The heart size is normal. Both lungs are clear. No  evidence of pleural effusion. No mass or lymphadenopathy  identified. Cervical spine fusion hardware again noted.  IMPRESSION:  No active cardiopulmonary disease.  DG Sinus, 1/4 @0900  Findings: Water's view shows no evidence of sinus air fluid levels.  No definite mucosal thickening seen. No bone abnormality  identified. Tiny radiodensities seen projecting over the inferior  right orbit. This may represent artifact however tiny metallic  radiopaque foreign body cannot be excluded.  IMPRESSION:  1. No radiographic evidence of sinusitis.  2. Question tiny right orbital metallic foreign body versus  artifact.   Procedures: none  Brief Hospital Course: Abigail [name] is a 52 y/o female with PMH of SLE, Sjogren's syndrome, HTN, Asthma and depression presenting with uncontrollable cough and dyspnea, desatting to the low 90's while ambulating at urgent care. Positive for influenza A at urgent care. Admitted 1/4. Pt had only mild improvement of symptoms though did have less dyspnea with  supportive therapy. Pt's fevers trending improved at time of discharge. See below by problem list.  #Cough and Dyspnea: Likely due to influenza A as above, possibly exacerbated by relatively immunosuppressed state (on treatment for SLE, Sjogren's, as below). Low-grade elevated temps, one fever overnight 1/5-6 to 102 improved with Tylenol. WBC WNL, mild tachycardia (likely from mild distress). CXR without consolidation or other suspicious features.  - Still with many subjective complaints and medication/ homeequipment requests at time of discharge   -discussed with case management   -orders placed for home O2, neb machine, and hospital bed prior to discharge - Continue Tamiflu; pt started on amoxicillin for presumptive otitis media on 1/5 to continue after discharge - Pt was treated with PRN humidified O2 to keep sats < 90% and albuterol nebulizers PRN  -home O2 and nebulizer ordered prior to discharge  -pt instructed to continue asthma medications, otherwise - Changed Hycodan PRN to scheduled for cough and Rx provided at discharge. - Advised Tylenol for fever/headache and to push fluids   #Asthma: no wheezing and non-labored on exam, likely at baseline.  - monitored closely as flu may exacerbate symptoms; considered short burst of steroids, but no wheezing on exam  - PRN albuterol nebs as above  - home nebulizer machine ordered, as above, and prescribed home nebulizer medication   #SLE, Sjogren's: Current flare per of sjogren's per patient.  - ESR 50 on 11/13/2012  - Continued Imuran  #HTN: Well controlled on home meds  - continued home HCTZ, Losartan; monitor   #Depression with Anxiety: Appears to be at baseline. Anxiety possibly contributing to subjective SOB with good O2  sats.  - Continue home ativan and depakote, monitor symptoms  Discharge Medications:    Medication List     As of 11/18/2012 10:49 AM    TAKE these medications         acetaminophen 325 MG tablet   Commonly known  as: TYLENOL   Take 2 tablets (650 mg total) by mouth every 6 (six) hours as needed for pain or fever.      albuterol 108 (90 BASE) MCG/ACT inhaler   Commonly known as: PROVENTIL HFA;VENTOLIN HFA   Inhale 2 puffs into the lungs every 6 (six) hours as needed. For shortness of breath.      albuterol (5 MG/ML) 0.5% nebulizer solution   Commonly known as: PROVENTIL   Take 0.5 mLs (2.5 mg total) by nebulization every 4 (four) hours as needed for wheezing or shortness of breath.      amoxicillin 500 MG capsule   Commonly known as: AMOXIL   Take 1 capsule (500 mg total) by mouth 2 (two) times daily. Take one dose tonight 1/6 and then twice per day for four more days.      amphetamine-dextroamphetamine 10 MG tablet   Commonly known as: ADDERALL   Take 10 mg by mouth 2 (two) times daily as needed. For ADHD      azaTHIOprine 50 MG tablet   Commonly known as: IMURAN   Take 50 mg by mouth 2 (two) times daily.      azelastine 137 MCG/SPRAY nasal spray   Commonly known as: ASTELIN   Place 2 sprays into the nose daily as needed. For stuffy nose.      divalproex 500 MG DR tablet   Commonly known as: DEPAKOTE   Take 1 tablet (500 mg total) by mouth daily.      DSS 100 MG Caps   Take 100 mg by mouth 2 (two) times daily as needed.      estradiol 1 MG tablet   Commonly known as: ESTRACE   Take 1 tablet (1 mg total) by mouth daily.      fluticasone 50 MCG/ACT nasal spray   Commonly known as: FLONASE   Place 2 sprays into the nose daily.      hydrochlorothiazide 12.5 MG tablet   Commonly known as: HYDRODIURIL   Take 1 tablet (12.5 mg total) by mouth daily.      HYDROcodone-homatropine 5-1.5 MG/5ML syrup   Commonly known as: HYCODAN   Take 5 mLs by mouth every 8 (eight) hours as needed for cough.      LORazepam 1 MG tablet   Commonly known as: ATIVAN   Take 1 mg by mouth 2 (two) times daily as needed. For anxiety      losartan 100 MG tablet   Commonly known as: COZAAR   Take 1 tablet  (100 mg total) by mouth daily.      ondansetron 4 MG disintegrating tablet   Commonly known as: ZOFRAN-ODT   Take 1-2 tablets (4-8 mg total) by mouth every 8 (eight) hours as needed for nausea.      oseltamivir 75 MG capsule   Commonly known as: TAMIFLU   Take 1 capsule (75 mg total) by mouth 2 (two) times daily.      potassium citrate 10 MEQ (1080 MG) SR tablet   Commonly known as: UROCIT-K   Take 10 mEq by mouth daily.      ranitidine 150 MG tablet   Commonly known as: ZANTAC   Take 150 mg  by mouth 2 (two) times daily as needed. For GERD      tretinoin 0.1 % cream   Commonly known as: RETIN-A   Apply topically at bedtime.      zolpidem 10 MG tablet   Commonly known as: AMBIEN   Take 10 mg by mouth at bedtime as needed. For insomnia       Disposition: discharge home  Issues for Follow Up:  1. Flu - Subjective complaints throughout admission, though some objective improvement. Home O2 and nebulizer ordered, as above. Recommended completing course of Tamiflu and supportive care (Tylenol, push fluids, etc). Follow up as appropriate. 2. ?otitis media - Discharged with amoxicillin. Most likely viral, especially given positive flu status, but primary team felt it was reasonable to cover with abx. F/u any further symptoms and/or any necessarily further treatment. 3. Chronic issues (SLE, Sjogren's, etc) - Follow up and treat as appropriate.  Outstanding Results: none  Discharge Instructions: Please refer to Patient Instructions section of EMR for full details.  Patient was counseled important signs and symptoms that should prompt return to medical care, changes in medications, dietary instructions, activity restrictions, and follow up appointments.       Follow-up Information    Follow up with DAUB, STEVE A, MD. Schedule an appointment as soon as possible for a visit in 1 week.   Contact information:   [address] [place] [address] [phone]         Discharge  Condition: stable  [address], [place], MD 11/18/2012, 10:49 AM

## 2012-11-18 ENCOUNTER — Encounter: Payer: Self-pay | Admitting: Internal Medicine

## 2012-11-18 ENCOUNTER — Telehealth: Payer: Self-pay

## 2012-11-18 DIAGNOSIS — H6690 Otitis media, unspecified, unspecified ear: Secondary | ICD-10-CM | POA: Diagnosis present

## 2012-11-18 DIAGNOSIS — J101 Influenza due to other identified influenza virus with other respiratory manifestations: Secondary | ICD-10-CM | POA: Diagnosis present

## 2012-11-18 MED ORDER — AMOXICILLIN 500 MG PO CAPS: 1000.0000 mg | ORAL_CAPSULE | Freq: Three times a day (TID) | ORAL | Status: DC

## 2012-11-18 NOTE — Telephone Encounter (Signed)
On chart review, pt was started on amox 500mg  BID while admitted on 11/15/12, discharge instructions indicate she was to take this through 11/20/12.  This dose may be undertreating otitis media/sinusitis.  Will increase the dose and extend duration six more days - for a total of 10 days of treatment.  Pt should take 2 pills, three times daily with food through 11/24/12.

## 2012-11-18 NOTE — Telephone Encounter (Signed)
I think Amox is probably the best to cover sinuses, will advise patient please advise on cough meds.

## 2012-11-18 NOTE — Telephone Encounter (Signed)
PT WAS IN THE HOSPITAL AND WAS GIVEN 1000 MG AMOXICILLIN FOR A SINUS AND EAR INFECTION.   SHE THINKS SHE NEEDS A STRONG ANTIBIOTIC.   COULD WE CALL HER IN SOMETHING ELSE TO [facility].  ALSO THE COUGH MEDICINE WITH HYDROCODONE IS NOT HELPING HER AT ALL WITH THE COUGH.  ANY OTHER SUGGESTIONS?  [phone]

## 2012-11-19 MED ORDER — BENZONATATE 200 MG PO CAPS: 200.0000 mg | ORAL_CAPSULE | Freq: Three times a day (TID) | ORAL | Status: DC | PRN

## 2012-11-19 NOTE — Telephone Encounter (Signed)
Can you advise on the cough meds? Amox sent for her so she can complete full course.

## 2012-11-19 NOTE — Telephone Encounter (Signed)
Thanks, I have called patient to advise. Sent the Tessalon in for her.

## 2012-11-19 NOTE — Telephone Encounter (Signed)
If she is not allergic to them we can call in Tessalon Perles 100 mg one to 2  3 times a day for cough #30

## 2012-11-23 ENCOUNTER — Encounter: Payer: Self-pay | Admitting: Family Medicine

## 2012-11-23 ENCOUNTER — Ambulatory Visit (INDEPENDENT_AMBULATORY_CARE_PROVIDER_SITE_OTHER): Payer: Medicare Other | Admitting: Family Medicine

## 2012-11-23 ENCOUNTER — Telehealth: Payer: Self-pay | Admitting: *Deleted

## 2012-11-23 VITALS — BP 108/76 | HR 101 | Temp 98.4°F | Resp 16 | Ht 64.5 in | Wt 209.0 lb

## 2012-11-23 DIAGNOSIS — R0602 Shortness of breath: Secondary | ICD-10-CM

## 2012-11-23 DIAGNOSIS — H6690 Otitis media, unspecified, unspecified ear: Secondary | ICD-10-CM

## 2012-11-23 DIAGNOSIS — H6693 Otitis media, unspecified, bilateral: Secondary | ICD-10-CM

## 2012-11-23 DIAGNOSIS — J111 Influenza due to unidentified influenza virus with other respiratory manifestations: Secondary | ICD-10-CM

## 2012-11-23 DIAGNOSIS — J101 Influenza due to other identified influenza virus with other respiratory manifestations: Secondary | ICD-10-CM

## 2012-11-23 DIAGNOSIS — M329 Systemic lupus erythematosus, unspecified: Secondary | ICD-10-CM

## 2012-11-23 MED ORDER — AZELASTINE HCL 0.1 % NA SOLN: 2.0000 | Freq: Two times a day (BID) | NASAL | Status: DC

## 2012-11-23 MED ORDER — POTASSIUM CITRATE ER 10 MEQ (1080 MG) PO TBCR: 10.0000 meq | EXTENDED_RELEASE_TABLET | Freq: Every day | ORAL | Status: DC

## 2012-11-23 NOTE — Telephone Encounter (Signed)
[facility] requesting a refill on potassium cl er 10 MEQ. Last refill 10/09/12

## 2012-11-23 NOTE — Progress Notes (Signed)
[address]   [place], [place]  [address]   [phone]  Subjective:    Patient ID: Abigail [name], female    DOB: 07-11-1961, 52 y.o.   MRN: [id number]  HPIThis 52 y.o. female presents for evaluation of transition into care:  1.  Dyspnea on exertion: Admitted by Dr. [name] due to dyspnea on exertion, shortness of breath with acute influenza infection.  Pulse oximetry dropped to low 90s with ambulation. Admitted for 48 hours.   Much improved; no further dyspnea on exertion.  Chest symptoms much improved.  Clinically improved.  2.  Influenza A infection:  Admitted two nights at [facility].  Completed five day course of Tamiflu.   Husband took Tamiflu for ten days for prophylaxis.  Doing much better.  No further fevers.   3.  Otitis Media R: diagnosed during hospitalization.    Has done everything she knows to do.  Amoxicillin for several days.  Unable to wear CPAP due to nasal congestion.  Completely stopped Imuran.  Intermittent R ear pain.  B ears R>L full with decreased hearing.  Blowing blood and green congestion out of nose.  L ear looked worse during admission; rx for Amoxicillin 500mg  bid x 5 days; increased to 2 pills three times daily for past 5 days.  Green discoloration improving.  Feels like a train in head.  Hot shower with intermittent relief.  Nasal saline PRN.  Afrin PRN.  Prescription Flonase PRN.  Hot steamy showers PRN.  Codeine cough medication; no Tussionex.  Unable to hear.     Review of Systems  Constitutional: Negative for fever, chills, diaphoresis and fatigue.  HENT: Positive for hearing loss, ear pain, congestion, rhinorrhea, postnasal drip and sinus pressure. Negative for sore throat, voice change and ear discharge.   Respiratory: Positive for cough. Negative for shortness of breath and wheezing.   Gastrointestinal: Negative for nausea, vomiting and diarrhea.  Skin: Negative for rash.    Past Medical History  Diagnosis Date  . Lupus   . Migraine   . Complication of  anesthesia     Sjogrens Syndrome  . Sjogren's syndrome   . Shortness of breath     "related to the asthma"  . Vasculitis     "autoimmune"  . Diverticulitis   . Gastritis   . Duodenitis   . Pancreatitis   . Fibromyalgia   . Perforation bowel     "microperforation"  . Asthma   . Melanoma 2008    Back; [place].  . Depression   . GERD (gastroesophageal reflux disease)   . Hypertension   . Urticaria     Past Surgical History  Procedure Date  . Melanoma excision   . Tmj arthroplasty 1970  . Appendectomy   . Cholecystectomy   . Pancreas surgery     sphincterotomy  . Abdominoplasty   . Liver biopsy   . Breast surgery   . Anterior cervical decomp/discectomy fusion 10/29/2011    Procedure: ANTERIOR CERVICAL DECOMPRESSION/DISCECTOMY FUSION 2 LEVELS;  Surgeon: Karn Cassis;  Location: [facility];  Service: Neurosurgery;  Laterality: N/A;  Cervical five-six,Cervical six-seven nterior cervical decompression/diskectomy, fusion, plate  . Sigmoidectomy 03/2012    done at [facility]  . Abdominal hysterectomy     Partial; Ovaries intact.  DUB/fibroids.  . Colonoscopy 11/11/2008    Prior to Admission medications   Medication Sig Start Date End Date Taking? Authorizing Provider  albuterol (PROVENTIL HFA;VENTOLIN HFA) 108 (90 BASE) MCG/ACT inhaler Inhale 2 puffs into  the lungs every 6 (six) hours as needed. For shortness of breath. 01/10/12  Yes Chelle S Jeffery, PA-C  amoxicillin (AMOXIL) 500 MG capsule Take 2 capsules (1,000 mg total) by mouth 3 (three) times daily. 11/18/12  Yes Eleanore E Debbra Riding, PA-C  amphetamine-dextroamphetamine (ADDERALL) 10 MG tablet Take 10 mg by mouth 2 (two) times daily as needed. For ADHD   Yes Historical Provider, MD  benzonatate (TESSALON) 200 MG capsule Take 1 capsule (200 mg total) by mouth 3 (three) times daily as needed for cough. 11/19/12  Yes Collene Gobble, MD  divalproex (DEPAKOTE) 500 MG DR tablet Take 1 tablet (500 mg total) by mouth daily. 02/23/12  Yes Dois Davenport, MD  estradiol (ESTRACE) 1 MG tablet Take 1 tablet (1 mg total) by mouth daily. 08/17/12  Yes Ethelda Chick, MD  fluticasone (FLONASE) 50 MCG/ACT nasal spray Place 2 sprays into the nose daily. 09/11/12  Yes Ethelda Chick, MD  hydrochlorothiazide (HYDRODIURIL) 12.5 MG tablet Take 1 tablet (12.5 mg total) by mouth daily. 10/18/12  Yes Ethelda Chick, MD  HYDROcodone-homatropine [facility]) 5-1.5 MG/5ML syrup Take 5 mLs by mouth every 8 (eight) hours as needed for cough. 11/13/12  Yes Collene Gobble, MD  LORazepam (ATIVAN) 1 MG tablet Take 1 mg by mouth 2 (two) times daily as needed. For anxiety   Yes Historical Provider, MD  losartan (COZAAR) 100 MG tablet Take 1 tablet (100 mg total) by mouth daily. 02/23/12 02/22/13 Yes Dois Davenport, MD  ondansetron (ZOFRAN ODT) 4 MG disintegrating tablet Take 1-2 tablets (4-8 mg total) by mouth every 8 (eight) hours as needed for nausea. 11/13/12  Yes Collene Gobble, MD  potassium citrate (UROCIT-K) 10 MEQ (1080 MG) SR tablet Take 1 tablet (10 mEq total) by mouth daily. 11/23/12  Yes Heather M Marte, PA-C  ranitidine (ZANTAC) 150 MG tablet Take 150 mg by mouth 2 (two) times daily as needed. For GERD   Yes Historical Provider, MD  tretinoin (RETIN-A) 0.1 % cream Apply topically at bedtime. 02/23/12 02/22/13 Yes Dois Davenport, MD  zolpidem (AMBIEN) 10 MG tablet Take 10 mg by mouth at bedtime as needed. For insomnia   Yes Historical Provider, MD  acetaminophen (TYLENOL) 325 MG tablet Take 2 tablets (650 mg total) by mouth every 6 (six) hours as needed for pain or fever. 11/16/12   Stephanie Coup Street, MD  albuterol (PROVENTIL) (5 MG/ML) 0.5% nebulizer solution Take 0.5 mLs (2.5 mg total) by nebulization every 4 (four) hours as needed for wheezing or shortness of breath. 11/16/12   Stephanie Coup Street, MD  azaTHIOprine (IMURAN) 50 MG tablet Take 50 mg by mouth 2 (two) times daily.  01/05/12   Historical Provider, MD  azelastine (ASTELIN) 137 MCG/SPRAY nasal spray Place 2  sprays into the nose daily as needed. For stuffy nose. 02/23/12   Dois Davenport, MD  azelastine (ASTELIN) 137 MCG/SPRAY nasal spray Place 2 sprays into the nose 2 (two) times daily. Use in each nostril as directed 11/23/12   Ethelda Chick, MD  docusate sodium 100 MG CAPS Take 100 mg by mouth 2 (two) times daily as needed. 11/16/12   Stephanie Coup Street, MD  oseltamivir (TAMIFLU) 75 MG capsule Take 1 capsule (75 mg total) by mouth 2 (two) times daily. 11/13/12   Collene Gobble, MD    Allergies  Allergen Reactions  . Meperidine Hcl Other (See Comments)    tachycardia  . Shrimp (Shellfish Allergy) Shortness  Of Breath  . Doxycycline Hives  . Latex Other (See Comments)    Breathing problems.  . Promethazine Hcl Other (See Comments)    "uncontrolled vomiting"  . Sulfonamide Derivatives Hives  . Tetracyclines & Related Rash    History   Social History  . Marital Status: Married    Spouse Name: Nadine Counts    Number of Children: 2  . Years of Education: N/A   Occupational History  .  [employer]   Social History Main Topics  . Smoking status: Never Smoker   . Smokeless tobacco: Never Used  . Alcohol Use: Yes     Comment: 01/20/12 once/month  . Drug Use: No  . Sexually Active: Yes -- Female partner(s)   Other Topics Concern  . Not on file   Social History Narrative   Marital status: married x 28 years to [place]; happily married; no abuse   Children: 2 children (73 yo son, 27 yo daughter); no grandchildren   Lives with: husband, daughter   Employment:  [employer] 2009-2013.  Resigned 08/2012.  Bachelor degrees in communication.     Tobacco:  None   Alcohol:  Once weekly; wine   Drugs: none   Exercise:  none          Family History  Problem Relation Age of Onset  . Colon cancer Paternal Uncle   . Colon polyps Sister   . Depression Sister     suicide  . Colon polyps Brother   . Colon polyps Mother   . Dementia Mother   . Heart disease Father 14    CHF  . Macular degeneration Father          Objective:   Physical Exam  Nursing note and vitals reviewed. Constitutional: She is oriented to person, place, and time. She appears well-developed and well-nourished. No distress.  HENT:  Head: Normocephalic and atraumatic.  Right Ear: External ear normal. Tympanic membrane is retracted. Tympanic membrane is not erythematous and not bulging.  Left Ear: External ear normal. Tympanic membrane is retracted. Tympanic membrane is not erythematous and not bulging.  Nose: Mucosal edema and rhinorrhea present.  Mouth/Throat: Oropharynx is clear and moist.  Eyes: Conjunctivae normal and EOM are normal. Pupils are equal, round, and reactive to light.  Neck: Normal range of motion. Neck supple.  Cardiovascular: Normal rate, regular rhythm and normal heart sounds.   Pulmonary/Chest: Effort normal and breath sounds normal. She has no wheezes. She has no rales.  Lymphadenopathy:    She has cervical adenopathy.  Neurological: She is alert and oriented to person, place, and time.  Skin: She is not diaphoretic.  Psychiatric: She has a normal mood and affect. Her behavior is normal.        Assessment & Plan:   1. Influenza A   2. Shortness of breath   3. Otitis media of both ears   4. SLE (systemic lupus erythematosus)      1. Influenza A: Improved; s/p Tamiflu x 5 days.  No further fever or respiratory distress. S/p admission x 48 hours. 2.  SOB: Improved/resolved.  Secondary to acute influenza infection; hospital records reviewed in detail during visit. 3.  Otitis Media B:  New to this provider.  Continue Amoxicillin for complete course.  Rx for Astelin nasal spray bid.  Start pseudoephedrine for next five days only; stop if suffers with intolerable side effects. Start Prednisone 30mg  daily x 3 days.  Continue nasal saline PRN.  To call in four days  if no improvement and will switch Amoxicillin. 4. SLE:  Stable; has stopped Imuran with acute influenza infection.    Meds ordered this  encounter  Medications  . azelastine (ASTELIN) 137 MCG/SPRAY nasal spray    Sig: Place 2 sprays into the nose 2 (two) times daily. Use in each nostril as directed    Dispense:  30 mL    Refill:  12

## 2012-11-23 NOTE — Patient Instructions (Addendum)
   START PSEUDOEPHEDRINE DAILY FOR NASAL CONGESTION. START ASTELIN NASAL SPRAY 2 SPRAYS EACH NOSTRIL TWICE DAILY. START PREDNISONE 30MG  DAILY FOR THREE DAYS. COMPLETE AMOXICILLIN THREE TIMES DAILY FOR NEXT FIVE DAYS. CONTINUE NASAL SALINE AS NEEDED.

## 2012-11-23 NOTE — Telephone Encounter (Signed)
Rx sent to pharmacy   

## 2012-11-26 ENCOUNTER — Telehealth: Payer: Self-pay

## 2012-11-26 ENCOUNTER — Telehealth: Payer: Self-pay | Admitting: Internal Medicine

## 2012-11-26 MED ORDER — LEVOFLOXACIN 750 MG PO TABS: 750.0000 mg | ORAL_TABLET | Freq: Every day | ORAL | Status: DC

## 2012-11-26 NOTE — Telephone Encounter (Signed)
I have left a long message on pt's mobile about not being able to find her OP report from [facility]. Is it under MY CHART? I asked her to call me or make an appointment if she is having any issues.

## 2012-11-26 NOTE — Telephone Encounter (Signed)
3. Otitis Media B: New to this provider. Continue Amoxicillin for complete course. Rx for Astelin nasal spray bid. Start pseudoephedrine for next five days only; stop if suffers with intolerable side effects. Start Prednisone 30mg  daily x 3 days. Continue nasal saline PRN. To call in four days if no improvement and will switch Amoxicillin. Above is your plan, please advise. Jonathon Tan

## 2012-11-26 NOTE — Telephone Encounter (Signed)
Patient's ear are still clogged, she is fatigued, and chest is clear. She would like Dr Katrinka Blazing to call her back.  Best [phone]

## 2012-11-26 NOTE — Telephone Encounter (Signed)
Pt reports that she has been taking the pseudoephedrine and the prednisone. She wanted Dr Katrinka Blazing to know that due to the way her body reacts she is having to taper off the prednisone, but she has enough to do this. She agreed to stop the Amox and to start Levaquin if it is not too expensive. If cost is too high, she will RTC for a shot of Rocephin and recheck first. Pt also wanted Dr Katrinka Blazing to know that her rheumatologist is aware that she is off all immunosuppressants and he is going to leave it that way until/unless it becomes necessary to restart them. I am sending in Rx for Levaquin.  Dr Katrinka Blazing, [place] Childes

## 2012-11-26 NOTE — Telephone Encounter (Signed)
Call --- is she any better?  Was she able to take Pseudoephedrine?  Was she able to take Prednisone?  If not improved much, please call in: Levaquin 750mg  one daily x 10 days #10 no refills.  She can stop Amoxicillin. KMS

## 2012-11-26 NOTE — Telephone Encounter (Signed)
Patient calling because she had a sigmoid resection due to perforation at [facility] on 03/30/12. She received the letter about her recall not being until later. She was not sure if she needs to schedule OV or if this will make a difference in her recall. ([facility] records in Care everywhere) please, advise.

## 2012-11-27 ENCOUNTER — Encounter: Payer: Self-pay | Admitting: Family Medicine

## 2012-11-27 NOTE — Telephone Encounter (Signed)
Noted.  Levaquin is now generic and should be more affordable.

## 2012-11-29 ENCOUNTER — Telehealth: Payer: Self-pay

## 2012-11-29 ENCOUNTER — Encounter: Payer: Self-pay | Admitting: Family Medicine

## 2012-11-29 ENCOUNTER — Ambulatory Visit (INDEPENDENT_AMBULATORY_CARE_PROVIDER_SITE_OTHER): Payer: Medicare Other | Admitting: Family Medicine

## 2012-11-29 VITALS — BP 101/69 | HR 100 | Temp 98.1°F | Resp 18 | Ht 64.5 in | Wt 209.0 lb

## 2012-11-29 DIAGNOSIS — J111 Influenza due to unidentified influenza virus with other respiratory manifestations: Secondary | ICD-10-CM

## 2012-11-29 DIAGNOSIS — J0100 Acute maxillary sinusitis, unspecified: Secondary | ICD-10-CM

## 2012-11-29 DIAGNOSIS — M329 Systemic lupus erythematosus, unspecified: Secondary | ICD-10-CM

## 2012-11-29 LAB — POCT CBC
HCT, POC: 42.7 % (ref 37.7–47.9)
Hemoglobin: 13.3 g/dL (ref 12.2–16.2)
Lymph, poc: 2 (ref 0.6–3.4)
MCHC: 31.1 g/dL — AB (ref 31.8–35.4)
MCV: 87.6 fL (ref 80–97)
POC LYMPH PERCENT: 34.4 %L (ref 10–50)
RDW, POC: 14.7 %
WBC: 5.7 10*3/uL (ref 4.6–10.2)

## 2012-11-29 MED ORDER — METHYLPREDNISOLONE ACETATE 80 MG/ML IJ SUSP
80.0000 mg | Freq: Once | INTRAMUSCULAR | Status: AC
  Administered 2012-11-29: 80 mg via INTRAMUSCULAR

## 2012-11-29 MED ORDER — ESTRADIOL 1 MG PO TABS: 1.0000 mg | ORAL_TABLET | Freq: Every day | ORAL | Status: DC

## 2012-11-29 NOTE — Progress Notes (Signed)
[address]   [place], [place]  [address]   [phone]  Subjective:    Patient ID: Abigail [name], female    DOB: Mar 09, 1961, 52 y.o.   MRN: [id number]  HPIThis 52 y.o. female presents for evaluation of acute sinusitis.  Evaluated one week ago; advised to complete Amoxicillin 1000mg  tid; advised to start Prednisone 30mg  daily x 3 days, start Pseudoephedrine, Astelin nasal spray.  Called three days ago, stating that no improvement.  Rx for Levaquin 750mg  daily provided.  Had to wean off of Prednisone due to side effects.  Sinus pressure has greatly worsened.  Blowing more congestion out of nose this morning; nasty colored.  Went to church but felt exhausted.  Still unable to hear.   No fever but +sweats.  Taking Pseudoephedrine four hour tablets; taking twice daily two tablets.  Taking Astelin bid.  Levaquin x 4 thus far.  Took Prednisone x 4 days because felt better on Prednisone.  No ear pain.  Unable to hear.  Sinus pressure much worse.  Very thick congestion and constant.  Feeling a little better currently.  Not coughing.  No sore throat.  Just miserable.  No true side effects to Prednisone but has had to use Prednisone on several occasions in past for SLE; tries to limit Prednisone use.     Review of Systems  Constitutional: Negative for fever, chills, diaphoresis and fatigue.  HENT: Positive for congestion, rhinorrhea, sneezing, voice change, postnasal drip and sinus pressure. Negative for ear pain, sore throat and trouble swallowing.   Respiratory: Negative for cough and shortness of breath.   Gastrointestinal: Negative for nausea, vomiting and diarrhea.  Skin: Negative for rash.  Neurological: Positive for headaches.    Past Medical History  Diagnosis Date  . Lupus   . Migraine   . Complication of anesthesia     Sjogrens Syndrome  . Sjogren's syndrome   . Shortness of breath     "related to the asthma"  . Vasculitis     "autoimmune"  . Diverticulitis   . Gastritis   .  Duodenitis   . Pancreatitis   . Fibromyalgia   . Perforation bowel     "microperforation"  . Asthma   . Melanoma 2008    Back; [place].  . Depression   . GERD (gastroesophageal reflux disease)   . Hypertension   . Urticaria     Past Surgical History  Procedure Date  . Melanoma excision   . Tmj arthroplasty 1970  . Appendectomy   . Cholecystectomy   . Pancreas surgery     sphincterotomy  . Abdominoplasty   . Liver biopsy   . Breast surgery   . Anterior cervical decomp/discectomy fusion 10/29/2011    Procedure: ANTERIOR CERVICAL DECOMPRESSION/DISCECTOMY FUSION 2 LEVELS;  Surgeon: Karn Cassis;  Location: [facility];  Service: Neurosurgery;  Laterality: N/A;  Cervical five-six,Cervical six-seven nterior cervical decompression/diskectomy, fusion, plate  . Sigmoidectomy 03/2012    done at [facility]  . Abdominal hysterectomy     Partial; Ovaries intact.  DUB/fibroids.  . Colonoscopy 11/11/2008    Prior to Admission medications   Medication Sig Start Date End Date Taking? Authorizing Provider  acetaminophen (TYLENOL) 325 MG tablet Take 2 tablets (650 mg total) by mouth every 6 (six) hours as needed for pain or fever. 11/16/12  Yes Stephanie Coup Street, MD  albuterol (PROVENTIL HFA;VENTOLIN HFA) 108 (90 BASE) MCG/ACT inhaler Inhale 2 puffs into the lungs every 6 (six) hours as needed.  For shortness of breath. 01/10/12  Yes Chelle S Jeffery, PA-C  albuterol (PROVENTIL) (5 MG/ML) 0.5% nebulizer solution Take 0.5 mLs (2.5 mg total) by nebulization every 4 (four) hours as needed for wheezing or shortness of breath. 11/16/12  Yes Stephanie Coup Street, MD  amphetamine-dextroamphetamine (ADDERALL) 10 MG tablet Take 10 mg by mouth 2 (two) times daily as needed. For ADHD   Yes Historical Provider, MD  azaTHIOprine (IMURAN) 50 MG tablet Take 50 mg by mouth 2 (two) times daily.  01/05/12  Yes Historical Provider, MD  azelastine (ASTELIN) 137 MCG/SPRAY nasal spray Place 2 sprays into the nose daily as  needed. For stuffy nose. 02/23/12  Yes Dois Davenport, MD  divalproex (DEPAKOTE) 500 MG DR tablet Take 1 tablet (500 mg total) by mouth daily. 02/23/12  Yes Dois Davenport, MD  docusate sodium 100 MG CAPS Take 100 mg by mouth 2 (two) times daily as needed. 11/16/12  Yes Stephanie Coup Street, MD  estradiol (ESTRACE) 1 MG tablet Take 1 tablet (1 mg total) by mouth daily. 08/17/12  Yes Ethelda Chick, MD  fluticasone (FLONASE) 50 MCG/ACT nasal spray Place 2 sprays into the nose daily. 09/11/12  Yes Ethelda Chick, MD  hydrochlorothiazide (HYDRODIURIL) 12.5 MG tablet Take 1 tablet (12.5 mg total) by mouth daily. 10/18/12  Yes Ethelda Chick, MD  HYDROcodone-homatropine [facility]) 5-1.5 MG/5ML syrup Take 5 mLs by mouth every 8 (eight) hours as needed for cough. 11/13/12  Yes Collene Gobble, MD  levofloxacin (LEVAQUIN) 750 MG tablet Take 1 tablet (750 mg total) by mouth daily. , for 10 days. 11/26/12  Yes Ethelda Chick, MD  LORazepam (ATIVAN) 1 MG tablet Take 1 mg by mouth 2 (two) times daily as needed. For anxiety   Yes Historical Provider, MD  losartan (COZAAR) 100 MG tablet Take 1 tablet (100 mg total) by mouth daily. 02/23/12 02/22/13 Yes Dois Davenport, MD  ondansetron (ZOFRAN ODT) 4 MG disintegrating tablet Take 1-2 tablets (4-8 mg total) by mouth every 8 (eight) hours as needed for nausea. 11/13/12  Yes Collene Gobble, MD  potassium chloride (K-DUR) 10 MEQ tablet  10/09/12  Yes Historical Provider, MD  potassium citrate (UROCIT-K) 10 MEQ (1080 MG) SR tablet Take 1 tablet (10 mEq total) by mouth daily. 11/23/12  Yes Heather M Marte, PA-C  ranitidine (ZANTAC) 150 MG tablet Take 150 mg by mouth 2 (two) times daily as needed. For GERD   Yes Historical Provider, MD  tretinoin (RETIN-A) 0.1 % cream Apply topically at bedtime. 02/23/12 02/22/13 Yes Dois Davenport, MD  zolpidem (AMBIEN) 10 MG tablet Take 10 mg by mouth at bedtime as needed. For insomnia   Yes Historical Provider, MD  amoxicillin (AMOXIL) 500 MG capsule  Take 2 capsules (1,000 mg total) by mouth 3 (three) times daily. 11/18/12   Eleanore Delia Chimes, PA-C  benzonatate (TESSALON) 200 MG capsule Take 1 capsule (200 mg total) by mouth 3 (three) times daily as needed for cough. 11/19/12   Collene Gobble, MD    Allergies  Allergen Reactions  . Meperidine Hcl Other (See Comments)    tachycardia  . Shrimp (Shellfish Allergy) Shortness Of Breath  . Doxycycline Hives  . Latex Other (See Comments)    Breathing problems.  . Promethazine Hcl Other (See Comments)    "uncontrolled vomiting"  . Sulfonamide Derivatives Hives  . Tetracyclines & Related Rash    History   Social History  . Marital Status: Married  Spouse Name: Nadine Counts    Number of Children: 2  . Years of Education: N/A   Occupational History  .  [employer]   Social History Main Topics  . Smoking status: Never Smoker   . Smokeless tobacco: Never Used  . Alcohol Use: Yes     Comment: 01/20/12 once/month  . Drug Use: No  . Sexually Active: Yes -- Female partner(s)   Other Topics Concern  . Not on file   Social History Narrative   Marital status: married x 28 years to [place]; happily married; no abuse   Children: 2 children (28 yo son, 1 yo daughter); no grandchildren   Lives with: husband, daughter   Employment:  [employer] 2009-2013.  Resigned 08/2012.  Bachelor degrees in communication.     Tobacco:  None   Alcohol:  Once weekly; wine   Drugs: none   Exercise:  none          Family History  Problem Relation Age of Onset  . Colon cancer Paternal Uncle   . Colon polyps Sister   . Depression Sister     suicide  . Colon polyps Brother   . Colon polyps Mother   . Dementia Mother   . Heart disease Father 75    CHF  . Macular degeneration Father        Objective:   Physical Exam  Nursing note and vitals reviewed. Constitutional: She is oriented to person, place, and time. She appears well-developed and well-nourished. No distress.  HENT:  Head: Normocephalic and atraumatic.  Right  Ear: External ear normal.  Left Ear: External ear normal.  Nose: Mucosal edema and rhinorrhea present. Right sinus exhibits maxillary sinus tenderness. Right sinus exhibits no frontal sinus tenderness. Left sinus exhibits maxillary sinus tenderness. Left sinus exhibits no frontal sinus tenderness.  Mouth/Throat: Oropharynx is clear and moist.  Eyes: Conjunctivae normal are normal. Pupils are equal, round, and reactive to light.  Neck: Normal range of motion. Neck supple.  Cardiovascular: Normal rate, regular rhythm and normal heart sounds.   No murmur heard. Pulmonary/Chest: Effort normal and breath sounds normal. She has no wheezes. She has no rales.  Lymphadenopathy:    She has no cervical adenopathy.  Neurological: She is alert and oriented to person, place, and time. No cranial nerve deficit. Coordination normal.  Skin: Skin is warm and dry. No rash noted. She is not diaphoretic.  Psychiatric: She has a normal mood and affect. Her behavior is normal.    DEPOMEDROL 80MG  IM ADMINISTERED.  Results for orders placed in visit on 11/29/12  POCT CBC      Component Value Range   WBC 5.7  4.6 - 10.2 K/uL   Lymph, poc 2.0  0.6 - 3.4   POC LYMPH PERCENT 34.4  10 - 50 %L   MID (cbc) 0.4  0 - 0.9   POC MID % 6.3  0 - 12 %M   POC Granulocyte 3.4  2 - 6.9   Granulocyte percent 59.3  37 - 80 %G   RBC 4.88  4.04 - 5.48 M/uL   Hemoglobin 13.3  12.2 - 16.2 g/dL   HCT, POC 14.7  82.9 - 47.9 %   MCV 87.6  80 - 97 fL   MCH, POC 27.3  27 - 31.2 pg   MCHC 31.1 (*) 31.8 - 35.4 g/dL   RDW, POC 56.2     Platelet Count, POC 350  142 - 424 K/uL   MPV 10.1  0 - 99.8 fL  GLUCOSE, POCT (MANUAL RESULT ENTRY)      Component Value Range   POC Glucose 81  70 - 99 mg/dl        Assessment & Plan:   1. Sinusitis, acute maxillary  POCT CBC, POCT glucose (manual entry), methylPREDNISolone acetate (DEPO-MEDROL) injection 80 mg  2. SLE (systemic lupus erythematosus)    3. Influenza       1.  Acute  Sinusitis:  Persistent/worsening.  Complete Levaquin 750mg  daily x 10 days.  S/p DepoMedrol 80mg  IM in office; restart Prednisone 40mg  daily x 4 days then 20 mg daily x 4 days, then 10mg  daily x 2 days, then 5mg  daily x 2 days. 2.  Influenza: improving; s/p admission. 3.  SLE:  Stable; has recently consulted with rheumatology; advised to hold all rheumatological medications with acute infection.

## 2012-11-29 NOTE — Telephone Encounter (Signed)
Pt called stated sinus infection worse . Please call pt back asap to discuss. [phone]

## 2012-11-29 NOTE — Patient Instructions (Addendum)
1. Sinusitis, acute maxillary  POCT CBC, POCT glucose (manual entry), methylPREDNISolone acetate (DEPO-MEDROL) injection 80 mg  2. SLE (systemic lupus erythematosus)    3. Influenza       1.  RESTART PREDNISONE 40MG  DAILY FOR FOUR DAYS THEN DECREASE TO 30MG  DAILY FOR TWO DAYS THEN 20MG  DAILY FOR TWO DAYS.  THEN WEAN AS APPROPRIATE.Marland Kitchen

## 2012-11-29 NOTE — Telephone Encounter (Signed)
90 day supply of estradiol sent in to [facility] with 4 refills.   Eula Listen, PA-C 11/29/2012 2:04 PM

## 2012-11-29 NOTE — Telephone Encounter (Signed)
Patient here now for office visit.  

## 2012-12-02 ENCOUNTER — Encounter: Payer: Self-pay | Admitting: Family Medicine

## 2012-12-05 ENCOUNTER — Telehealth: Payer: Self-pay

## 2012-12-05 ENCOUNTER — Other Ambulatory Visit: Payer: Self-pay | Admitting: Family Medicine

## 2012-12-05 MED ORDER — PREDNISONE 10 MG PO TABS: ORAL_TABLET | ORAL | Status: DC

## 2012-12-05 NOTE — Telephone Encounter (Signed)
KIM FROM [facility] IS CALLING IN REGARDS TO PT'S PREDNISONE, THE INSTRUCTIONS STATE TO TAKE AS DIRECTED BUT THE INSURANCE REQUIRES ACTUAL INSTRUCTIONS. BEST# [phone] OPTION 3 (KIM)

## 2012-12-07 NOTE — Telephone Encounter (Signed)
restart Prednisone 40mg  daily x 4 days then 20 mg daily x 4 days, then 10mg  daily x 2 days, then 5mg  daily x 2 days. Called pharmacy to advise.

## 2012-12-11 ENCOUNTER — Telehealth: Payer: Self-pay

## 2012-12-11 NOTE — Telephone Encounter (Signed)
Will pull chart

## 2012-12-11 NOTE — Telephone Encounter (Signed)
[name] CALLED AND SAID THAT HER INSURANCE COMPANY NEEDS A LETTER FROM DR DAUB STATING THAT IT WAS A MEDICAL NECESSITY THAT SHE BE TAKEN OUT BY AMBULANCE ON DOS 03/24/11.   PT'S PHONE [phone]

## 2012-12-14 NOTE — Telephone Encounter (Signed)
MR [id number]

## 2012-12-15 NOTE — Telephone Encounter (Signed)
Please write note to that effect. Patient was too ill to travel by private car to the hospital for admission .

## 2012-12-15 NOTE — Telephone Encounter (Signed)
Chart pulled and reviewed. Patient was here with high fevers, elevated sed rate, see chart note, I can write letter, please advise if okay.

## 2012-12-16 NOTE — Telephone Encounter (Signed)
Note provided, mailed to patient.

## 2012-12-25 ENCOUNTER — Encounter: Payer: Self-pay | Admitting: Family Medicine

## 2012-12-25 DIAGNOSIS — G4733 Obstructive sleep apnea (adult) (pediatric): Secondary | ICD-10-CM

## 2012-12-25 HISTORY — DX: Obstructive sleep apnea (adult) (pediatric): G47.33

## 2012-12-28 ENCOUNTER — Encounter (HOSPITAL_COMMUNITY): Payer: Self-pay

## 2012-12-28 ENCOUNTER — Emergency Department (HOSPITAL_COMMUNITY)
Admission: EM | Admit: 2012-12-28 | Discharge: 2012-12-28 | Discharge status: Against Medical Advice | Payer: Medicare Other | Attending: Emergency Medicine | Admitting: Emergency Medicine

## 2012-12-28 ENCOUNTER — Ambulatory Visit (INDEPENDENT_AMBULATORY_CARE_PROVIDER_SITE_OTHER): Payer: Medicare Other | Admitting: Internal Medicine

## 2012-12-28 ENCOUNTER — Encounter: Payer: Self-pay | Admitting: Internal Medicine

## 2012-12-28 VITALS — BP 158/95 | HR 97 | Temp 98.3°F | Resp 17 | Ht 65.0 in | Wt 210.0 lb

## 2012-12-28 DIAGNOSIS — J45909 Unspecified asthma, uncomplicated: Secondary | ICD-10-CM | POA: Insufficient documentation

## 2012-12-28 DIAGNOSIS — M329 Systemic lupus erythematosus, unspecified: Secondary | ICD-10-CM

## 2012-12-28 DIAGNOSIS — R002 Palpitations: Secondary | ICD-10-CM | POA: Insufficient documentation

## 2012-12-28 DIAGNOSIS — R079 Chest pain, unspecified: Secondary | ICD-10-CM

## 2012-12-28 DIAGNOSIS — I10 Essential (primary) hypertension: Secondary | ICD-10-CM | POA: Insufficient documentation

## 2012-12-28 DIAGNOSIS — E876 Hypokalemia: Secondary | ICD-10-CM

## 2012-12-28 NOTE — ED Notes (Signed)
Patient reports that she went for check up 5 days ago and started having palpitations 3 days ago. Patient was called by her rheumatologist today and was told that her potassium level was 2.9. Patient has been taking her daily dose of HCTZ.

## 2012-12-28 NOTE — Patient Instructions (Signed)
Go directly to the ER at [facility].

## 2012-12-28 NOTE — Progress Notes (Signed)
  Subjective:    Patient ID: Abigail [name], female    DOB: [birth year], 52 y.o.   MRN: [id number]  HPI Palpitations Chest pain Onset 6 days ago Worse over the past 2 days Sob on exertion Chest pain is sharp and radiates to the back/moderate in severity Palpitations feel fast and irregular lasts 30 minutes at a time and she feels weak with the palpitations No nausea Saw the rheumatologist at [facility] 6 days ago and was called to day and told her potassium was low. 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 She recently stopped the imuran at the drs orders when she was diagnosed with influenza.  Review of Systems  Constitutional: Positive for fatigue.  HENT: Negative.   Eyes: Negative.   Respiratory: Positive for chest tightness and shortness of breath.   Cardiovascular: Positive for chest pain and palpitations.  Gastrointestinal: Negative.   Endocrine: Negative.   Genitourinary: Negative.   Musculoskeletal: Negative.   Skin: Negative.   Allergic/Immunologic: Negative.   Neurological: Positive for weakness.  Hematological: Negative.   Psychiatric/Behavioral: Negative.        Objective:   Physical Exam  Nursing note and vitals reviewed. Constitutional: She is oriented to person, place, and time. She appears well-developed and well-nourished.  HENT:  Head: Normocephalic and atraumatic.  Right Ear: External ear normal.  Left Ear: External ear normal.  Nose: Nose normal.  Mouth/Throat: Oropharynx is clear and moist.  Eyes: Conjunctivae and EOM are normal. Pupils are equal, round, and reactive to light.  Neck: Normal range of motion. Neck supple. No JVD present. No tracheal deviation present. No thyromegaly present.  Cardiovascular: Normal rate, regular rhythm, normal heart sounds and intact distal pulses.  Exam reveals no friction rub.   No murmur heard. Pulmonary/Chest: Effort normal and breath sounds normal.   Abdominal: Soft. Bowel sounds are normal.  Musculoskeletal: Normal range of motion.  Lymphadenopathy:    She has no cervical adenopathy.  Neurological: She is alert and oriented to person, place, and time. She has normal reflexes.  Skin: Skin is warm and dry.  Psychiatric: She has a normal mood and affect. Her behavior is normal. Judgment and thought content normal.  anxious   ekg nsr hr 81 nonspecific st t wave changes. Pr qrs axis are normal       Assessment & Plan:  Pt with chest pain and palpitations with elevated BP and complicated medical problems to include lupus, recently stopped her immunosuppressant meds because of dx'd influenza and has a known history of renal disease and recent low potassium dx'd. Differential diagnosis includes arrythmia, electrolyte abnormality, renal failure, angina, pericarditis, Recommend we transport pt to the er by ems.Discussed this extensively with the patient and her husband in the presence of the nurse and they refuse transport by EMS understanding the risk of leaving the office without EMS. They agree to go directly to the ER at [facility]. Patient is competent to make this decision and the risks and differential diagnosis has been discussed with the patient. Progress note and ekg sent with patient and ER called to inform them of pts plan to come directly to the er.

## 2012-12-28 NOTE — ED Notes (Signed)
Pt states she feels better and has decided to go home, encouraged to stay, encouraged to follow up

## 2012-12-29 ENCOUNTER — Encounter: Payer: Self-pay | Admitting: Family Medicine

## 2013-01-01 ENCOUNTER — Telehealth: Payer: Self-pay

## 2013-01-01 NOTE — Telephone Encounter (Signed)
Patients needs copy of office notes from Mar 24, 2011 and anything related to those visits. Patient was transported to the hospital and needs records for insurance purposes.  Please call when ready for pickup. Patient will sign release of info. [phone].

## 2013-01-04 ENCOUNTER — Encounter: Payer: Self-pay | Admitting: Family Medicine

## 2013-01-04 ENCOUNTER — Ambulatory Visit (INDEPENDENT_AMBULATORY_CARE_PROVIDER_SITE_OTHER): Payer: Medicare Other | Admitting: Family Medicine

## 2013-01-04 VITALS — BP 110/70 | HR 75 | Temp 98.1°F | Resp 16

## 2013-01-04 DIAGNOSIS — R079 Chest pain, unspecified: Secondary | ICD-10-CM

## 2013-01-04 DIAGNOSIS — I10 Essential (primary) hypertension: Secondary | ICD-10-CM

## 2013-01-04 DIAGNOSIS — E876 Hypokalemia: Secondary | ICD-10-CM

## 2013-01-04 DIAGNOSIS — R002 Palpitations: Secondary | ICD-10-CM

## 2013-01-04 LAB — CBC WITH DIFFERENTIAL/PLATELET
Basophils Absolute: 0 10*3/uL (ref 0.0–0.1)
Eosinophils Absolute: 0.1 10*3/uL (ref 0.0–0.7)
Eosinophils Relative: 1 % (ref 0–5)
HCT: 37.1 % (ref 36.0–46.0)
Lymphocytes Relative: 33 % (ref 12–46)
Lymphs Abs: 1.4 10*3/uL (ref 0.7–4.0)
MCH: 28.6 pg (ref 26.0–34.0)
MCV: 82.3 fL (ref 78.0–100.0)
Monocytes Absolute: 0.4 10*3/uL (ref 0.1–1.0)
Platelets: 251 10*3/uL (ref 150–400)
RDW: 15.1 % (ref 11.5–15.5)
WBC: 4.2 10*3/uL (ref 4.0–10.5)

## 2013-01-04 MED ORDER — POTASSIUM CHLORIDE CRYS ER 20 MEQ PO TBCR: 20.0000 meq | EXTENDED_RELEASE_TABLET | Freq: Every day | ORAL | Status: DC

## 2013-01-04 NOTE — Patient Instructions (Addendum)
Hypokalemia - Plan: Comprehensive metabolic panel, CBC with Differential  Essential hypertension, benign - Plan: Comprehensive metabolic panel, CBC with Differential  Chest pain  Palpitations

## 2013-01-04 NOTE — Progress Notes (Signed)
[address]   [place], [place]  [address]   [phone]  Subjective:    Patient ID: Abigail [name], female    DOB: [birth year], 52 y.o.   MRN: [id number]  HPI This 52 y.o. female presents for evaluation of the following:  1.  Hypokalemia:  S/p rheumatology consultation; K was 2.9 at rheumatology office.  Took two K before visit last week; due for repeat labs today.  2.  Chest pain:  History of esophageal spasm; hot water will frequently resolve spasm; went to ED at [facility]; s/p EKG in ED was normal; EKG at [facility] normal.  Really felt that chest pain due to esophageal spasm thus left ED before being evaluated.    3.  Palpitations:  Became less severe four hours after ingesting K supplementation last week.  Palpitations becoming less frequent.  Feeling better.  4. Hypertension:  Stopped HCTZ; blood pressure went up to 150/90 in past week and took 1/2 HCTZ.   Walking more and more.  Low carb diet; eating regular again. Not losing much weight.  5.  SLE: feeling well; very fatigued; +palpitations.   Review of Systems  Constitutional: Positive for activity change and fatigue. Negative for fever, chills, diaphoresis and appetite change.  Respiratory: Negative for shortness of breath, wheezing and stridor.   Cardiovascular: Positive for chest pain and palpitations. Negative for leg swelling.  Gastrointestinal: Negative for nausea and vomiting.  Neurological: Negative for dizziness, tremors, syncope, facial asymmetry, speech difficulty, weakness, light-headedness, numbness and headaches.        Past Medical History  Diagnosis Date  . Lupus   . Migraine   . Complication of anesthesia     Sjogrens Syndrome  . Sjogren's syndrome   . Shortness of breath     "related to the asthma"  . Vasculitis     "autoimmune"  . Diverticulitis   . Gastritis   . Duodenitis   . Pancreatitis   . Fibromyalgia   . Perforation bowel     "microperforation"  . Asthma   . Melanoma 2008    Back;  Tafeen.  . Depression   . GERD (gastroesophageal reflux disease)   . Hypertension   . Urticaria   . Obstructive sleep apnea 12/25/2012    Past Surgical History  Procedure Laterality Date  . Melanoma excision    . Tmj arthroplasty  1970  . Appendectomy    . Cholecystectomy    . Pancreas surgery      sphincterotomy  . Abdominoplasty    . Liver biopsy    . Breast surgery    . Anterior cervical decomp/discectomy fusion  10/29/2011    Procedure: ANTERIOR CERVICAL DECOMPRESSION/DISCECTOMY FUSION 2 LEVELS;  Surgeon: Karn Cassis;  Location: [facility];  Service: Neurosurgery;  Laterality: N/A;  Cervical five-six,Cervical six-seven nterior cervical decompression/diskectomy, fusion, plate  . Sigmoidectomy  03/2012    done at [facility]  . Abdominal hysterectomy      Partial; Ovaries intact.  DUB/fibroids.  . Colonoscopy  11/11/2008  . Sleep study  10/11/2012    +severe OSA.  [facility].    Prior to Admission medications   Medication Sig Start Date End Date Taking? Authorizing Provider  albuterol (PROVENTIL HFA;VENTOLIN HFA) 108 (90 BASE) MCG/ACT inhaler Inhale 2 puffs into the lungs every 6 (six) hours as needed. For shortness of breath. 01/10/12  Yes Chelle S Jeffery, PA-C  albuterol (PROVENTIL) (5 MG/ML) 0.5% nebulizer solution Take 0.5 mLs (2.5 mg total) by nebulization  every 4 (four) hours as needed for wheezing or shortness of breath. 11/16/12  Yes Stephanie Coup Street, MD  amphetamine-dextroamphetamine (ADDERALL) 10 MG tablet Take 10 mg by mouth 2 (two) times daily as needed. For ADHD   Yes Historical Provider, MD  azelastine (ASTELIN) 137 MCG/SPRAY nasal spray Place 2 sprays into the nose daily as needed. For stuffy nose. 02/23/12  Yes Dois Davenport, MD  divalproex (DEPAKOTE) 500 MG DR tablet Take 500 mg by mouth every morning. 02/23/12  Yes Dois Davenport, MD  estradiol (ESTRACE) 1 MG tablet Take 1 mg by mouth every morning. 11/29/12  Yes Ryan M Dunn, PA-C  LORazepam (ATIVAN) 1 MG  tablet Take 1 mg by mouth 2 (two) times daily as needed. For anxiety   Yes Historical Provider, MD  losartan (COZAAR) 100 MG tablet Take 100 mg by mouth every morning. 02/23/12 02/22/13 Yes Dois Davenport, MD  potassium citrate (UROCIT-K) 10 MEQ (1080 MG) SR tablet Take 1 tablet (10 mEq total) by mouth daily. 11/23/12  Yes Heather M Marte, PA-C  ranitidine (ZANTAC) 150 MG tablet Take 150 mg by mouth daily. For GERD   Yes Historical Provider, MD  tretinoin (RETIN-A) 0.1 % cream Apply topically at bedtime. 02/23/12 02/22/13 Yes Dois Davenport, MD  zolpidem (AMBIEN) 10 MG tablet Take 10 mg by mouth at bedtime as needed. For insomnia   Yes Historical Provider, MD  potassium chloride SA (K-DUR,KLOR-CON) 20 MEQ tablet Take 1 tablet (20 mEq total) by mouth daily. 01/04/13   Ethelda Chick, MD    Allergies  Allergen Reactions  . Meperidine Hcl Other (See Comments)    tachycardia  . Shrimp (Shellfish Allergy) Shortness Of Breath  . Doxycycline Hives  . Latex Other (See Comments)    Breathing problems.  . Promethazine Hcl Other (See Comments)    "uncontrolled vomiting"  . Sulfonamide Derivatives Hives  . Tetracyclines & Related Rash    History   Social History  . Marital Status: Married    Spouse Name: Nadine Counts    Number of Children: 2  . Years of Education: N/A   Occupational History  .  [employer]   Social History Main Topics  . Smoking status: Never Smoker   . Smokeless tobacco: Never Used  . Alcohol Use: Yes     Comment: 01/20/12 once/month  . Drug Use: No  . Sexually Active: Yes -- Female partner(s)   Other Topics Concern  . Not on file   Social History Narrative   Marital status: married x 28 years to [place]; happily married; no abuse      Children: 2 children (32 yo son, 67 yo daughter); no grandchildren      Lives with: husband, daughter      Employment:  [employer] 2009-2013.  Resigned 08/2012.  Bachelor degrees in communication.        Tobacco:  None      Alcohol:  Once weekly; wine       Drugs: none      Exercise:  none                Family History  Problem Relation Age of Onset  . Colon cancer Paternal Uncle   . Colon polyps Sister   . Depression Sister     suicide  . Colon polyps Brother   . Colon polyps Mother   . Dementia Mother   . Heart disease Father 20    CHF  . Macular degeneration Father  Objective:   Physical Exam  Nursing note and vitals reviewed. Constitutional: She is oriented to person, place, and time. She appears well-developed and well-nourished. No distress.  HENT:  Mouth/Throat: Oropharynx is clear and moist.  Eyes: Conjunctivae and EOM are normal. Pupils are equal, round, and reactive to light.  Neck: Normal range of motion. Neck supple. No JVD present. No thyromegaly present.  Cardiovascular: Normal rate, regular rhythm, normal heart sounds and intact distal pulses.  Exam reveals no gallop and no friction rub.   No murmur heard. Pulmonary/Chest: Effort normal and breath sounds normal. She has no wheezes. She has no rales.  Lymphadenopathy:    She has no cervical adenopathy.  Neurological: She is alert and oriented to person, place, and time. No cranial nerve deficit. She exhibits normal muscle tone. Coordination normal.  Skin: Skin is warm and dry. No rash noted. She is not diaphoretic.  Psychiatric: She has a normal mood and affect. Her behavior is normal. Judgment and thought content normal.       Assessment & Plan:  Hypokalemia - Plan: Comprehensive metabolic panel, CBC with Differential  Essential hypertension, benign - Plan: Comprehensive metabolic panel, CBC with Differential  Chest pain  Palpitations   1.  Hypokalemia:  New/recurrent; repeat labs today.  Rx for KCL provided.   2.  HTN: stable/controlled; using HCTZ prn elevated blood pressure; advised to take KCL with each HCTZ dose.  Consider addition of Spironolactone in future for diuretic benefit. 3.  Chest pain:  Improving since last visit; left  ED without evaluation.  Non-exertional in nature.  Exercising regularly without pain. 4.  Palpitations: improving.    Meds ordered this encounter  Medications  . potassium chloride SA (K-DUR,KLOR-CON) 20 MEQ tablet    Sig: Take 1 tablet (20 mEq total) by mouth daily.    Dispense:  30 tablet    Refill:  5

## 2013-01-05 LAB — COMPREHENSIVE METABOLIC PANEL
CO2: 27 mEq/L (ref 19–32)
Calcium: 8.6 mg/dL (ref 8.4–10.5)
Chloride: 106 mEq/L (ref 96–112)
Creat: 0.66 mg/dL (ref 0.50–1.10)
Glucose, Bld: 97 mg/dL (ref 70–99)
Total Bilirubin: 0.3 mg/dL (ref 0.3–1.2)
Total Protein: 6.6 g/dL (ref 6.0–8.3)

## 2013-01-05 NOTE — Telephone Encounter (Signed)
Records ready for pick up. Left message for patient.

## 2013-01-18 ENCOUNTER — Telehealth: Payer: Self-pay

## 2013-01-18 NOTE — Telephone Encounter (Signed)
Dr Katrinka Blazing,   Okey Regal would like to know if she can get a refill on her diflucan to be sent to [facility] if possible

## 2013-01-25 MED ORDER — FLUCONAZOLE 150 MG PO TABS: 150.0000 mg | ORAL_TABLET | Freq: Once | ORAL | Status: DC

## 2013-01-25 NOTE — Telephone Encounter (Signed)
Refill for Diflucan sent to pharmacy.  If no improvement with Diflucan, will need OV.

## 2013-01-25 NOTE — Telephone Encounter (Signed)
called patient to advise. This is related to her Sjogrens and should help her.

## 2013-01-28 ENCOUNTER — Telehealth: Payer: Self-pay | Admitting: Neurology

## 2013-02-16 ENCOUNTER — Encounter: Payer: Self-pay | Admitting: Internal Medicine

## 2013-02-16 ENCOUNTER — Ambulatory Visit (INDEPENDENT_AMBULATORY_CARE_PROVIDER_SITE_OTHER): Payer: Medicare Other | Admitting: Internal Medicine

## 2013-02-16 ENCOUNTER — Other Ambulatory Visit: Payer: Self-pay | Admitting: Internal Medicine

## 2013-02-16 ENCOUNTER — Ambulatory Visit: Payer: Medicare Other

## 2013-02-16 VITALS — BP 124/72 | HR 86 | Resp 18

## 2013-02-16 DIAGNOSIS — M25532 Pain in left wrist: Secondary | ICD-10-CM

## 2013-02-16 DIAGNOSIS — M25539 Pain in unspecified wrist: Secondary | ICD-10-CM

## 2013-02-16 DIAGNOSIS — S40029A Contusion of unspecified upper arm, initial encounter: Secondary | ICD-10-CM

## 2013-02-16 DIAGNOSIS — S60212A Contusion of left wrist, initial encounter: Secondary | ICD-10-CM

## 2013-02-16 DIAGNOSIS — M79642 Pain in left hand: Secondary | ICD-10-CM

## 2013-02-16 DIAGNOSIS — M79609 Pain in unspecified limb: Secondary | ICD-10-CM

## 2013-02-16 NOTE — Progress Notes (Signed)
  Subjective:    Patient ID: Abigail [name], female    DOB: 1961-11-01, 52 y.o.   MRN: [id number]  HPI Crush dorsal left hand at home. Pain most over mid and proximal metacarpals 2 and 3.   Review of Systems     Objective:   Physical Exam Tender, swollen dorsal hand, NMSV intact  [employer] reading (PRIMARY) by  Dr.Guest no fx seen       Assessment & Plan:  Contusion/Crush left hand Splint/Sling/RICE

## 2013-02-26 NOTE — Telephone Encounter (Signed)
I am not sure why this kept falling through the cracks. I am so very sorry about this. I am not able to do Botox injections just yet. Would you pls relay to patient. We can perhaps ask Dr. Terrace Arabia or Dr. Marjory Lies? Pls advise. thx Huston Foley, MD, PhD [facility])

## 2013-03-02 NOTE — Telephone Encounter (Signed)
Yes, please set it up. Thanks.

## 2013-03-02 NOTE — Telephone Encounter (Signed)
Dr. Terrace Arabia are you able to do this botox for Dr. Frances Furbish

## 2013-03-10 ENCOUNTER — Other Ambulatory Visit: Payer: Self-pay | Admitting: Family Medicine

## 2013-03-10 NOTE — Telephone Encounter (Signed)
Do you want to RF this for pt?

## 2013-03-29 ENCOUNTER — Ambulatory Visit: Payer: Self-pay | Admitting: Neurology

## 2013-05-04 ENCOUNTER — Ambulatory Visit: Payer: Medicare Other | Admitting: Family Medicine

## 2013-05-09 ENCOUNTER — Ambulatory Visit (INDEPENDENT_AMBULATORY_CARE_PROVIDER_SITE_OTHER): Payer: Medicare Other | Admitting: Family Medicine

## 2013-05-09 ENCOUNTER — Ambulatory Visit: Payer: Medicare Other

## 2013-05-09 VITALS — BP 116/79 | HR 102 | Temp 98.0°F | Resp 17 | Ht 65.5 in | Wt 211.0 lb

## 2013-05-09 DIAGNOSIS — R5381 Other malaise: Secondary | ICD-10-CM

## 2013-05-09 DIAGNOSIS — M329 Systemic lupus erythematosus, unspecified: Secondary | ICD-10-CM

## 2013-05-09 DIAGNOSIS — R1032 Left lower quadrant pain: Secondary | ICD-10-CM

## 2013-05-09 DIAGNOSIS — J040 Acute laryngitis: Secondary | ICD-10-CM

## 2013-05-09 LAB — POCT URINALYSIS DIPSTICK
Bilirubin, UA: NEGATIVE
Ketones, UA: NEGATIVE
Leukocytes, UA: NEGATIVE
Protein, UA: NEGATIVE
pH, UA: 5

## 2013-05-09 LAB — POCT CBC
Granulocyte percent: 84.8 %G — AB (ref 37–80)
MID (cbc): 0.2 (ref 0–0.9)
POC Granulocyte: 7.2 — AB (ref 2–6.9)
POC LYMPH PERCENT: 12.6 %L (ref 10–50)
Platelet Count, POC: 282 10*3/uL (ref 142–424)
RBC: 5.18 M/uL (ref 4.04–5.48)
RDW, POC: 15.3 %

## 2013-05-09 LAB — POCT SEDIMENTATION RATE: POCT SED RATE: 21 mm/hr (ref 0–22)

## 2013-05-09 NOTE — Progress Notes (Signed)
[address]   [place], [place]  [address]   [phone]  Subjective:    Patient ID: Abigail [name], female    DOB: 1961/10/28, 52 y.o.   MRN: [id number]  HPI This 52 y.o. female presents for evaluation of laryngitis and SLE.    1.  SLE: s/p follow-up with Alene Mires; labs returned well.  Started Prednisone 30mg  and then wean down for likely flare of SLE/connective tissue disorder.  Last night, had L sided abdominal pain.    2.  URI: one week ago, onset of cold symptoms.  Feverish; fatigue; hoarseness; hives two weeks before; started back on Zantac.  Voice comes and goes intermittently; No headache; no ear pain; no sore throat; no nasal congestion; no rhinorrhea; no cough; no PND.  Intermittent dizzy.  No gland tender or swollen.  No diarrhea; +constipation.  Mild nausea; no vomiting.  No urinary symptoms; no dysuria.    3.  HTN: HCTZ 12.5mg  PRN swelling; not taking but once per week; following BP regularly.  systolics < 130/90.    4.  Abdominal pain LLQ: onset in past few days.  Bloating for past day; much better today.  No gallbladder; no appendix; s/p colectomy for diverticulitis.  S/p hysterectomy.+flatus. History of perforated colon from diverticulitis.  No nausea, vomiting, diarrhea.  Mild constipation; no bloody or black stools.   Review of Systems  Constitutional: Positive for fatigue. Negative for fever, chills and diaphoresis.  HENT: Positive for congestion, rhinorrhea, voice change and postnasal drip. Negative for ear pain, sore throat, sneezing, trouble swallowing, neck pain, neck stiffness, sinus pressure and ear discharge.   Respiratory: Negative for cough, shortness of breath, wheezing and stridor.   Cardiovascular: Positive for leg swelling. Negative for chest pain and palpitations.  Gastrointestinal: Positive for abdominal pain and constipation. Negative for nausea, vomiting, diarrhea, blood in stool, abdominal distention and anal bleeding.  Genitourinary: Negative for  dysuria, urgency, frequency, hematuria and flank pain.  Hematological: Negative for adenopathy.   Past Medical History  Diagnosis Date  . Lupus   . Migraine   . Complication of anesthesia     Sjogrens Syndrome  . Sjogren's syndrome   . Vasculitis     "autoimmune"  . Diverticulitis   . Gastritis   . Duodenitis   . Pancreatitis   . Fibromyalgia   . Perforation bowel     "microperforation"  . Asthma   . Melanoma 2008    Back; Tafeen.  . Depression   . GERD (gastroesophageal reflux disease)   . Hypertension   . Urticaria   . Obstructive sleep apnea 12/25/2012   Current Outpatient Prescriptions on File Prior to Visit  Medication Sig Dispense Refill  . albuterol (PROVENTIL HFA;VENTOLIN HFA) 108 (90 BASE) MCG/ACT inhaler Inhale 2 puffs into the lungs every 6 (six) hours as needed. For shortness of breath.  1 Inhaler  2  . albuterol (PROVENTIL) (5 MG/ML) 0.5% nebulizer solution Take 0.5 mLs (2.5 mg total) by nebulization every 4 (four) hours as needed for wheezing or shortness of breath.  20 mL  2  . potassium chloride SA (K-DUR,KLOR-CON) 20 MEQ tablet Take 1 tablet (20 mEq total) by mouth daily.  30 tablet  5  . ranitidine (ZANTAC) 150 MG tablet Take 150 mg by mouth daily. For GERD      . zolpidem (AMBIEN) 10 MG tablet Take 10 mg by mouth at bedtime as needed. For insomnia       No current facility-administered medications on  file prior to visit.        Objective:   Physical Exam  Nursing note and vitals reviewed. Constitutional: She is oriented to person, place, and time. She appears well-developed and well-nourished. No distress.  HENT:  Head: Normocephalic and atraumatic.  Right Ear: External ear normal.  Left Ear: External ear normal.  Nose: Nose normal.  Mouth/Throat: Oropharynx is clear and moist. No oropharyngeal exudate.  Eyes: Conjunctivae and EOM are normal. Pupils are equal, round, and reactive to light.  Neck: Normal range of motion. Neck supple. No thyromegaly  present.  Cardiovascular: Normal rate, regular rhythm, normal heart sounds and intact distal pulses.  Exam reveals no gallop and no friction rub.   No murmur heard. Pulmonary/Chest: Effort normal and breath sounds normal. She has no wheezes. She has no rales.  Abdominal: Soft. Bowel sounds are normal. She exhibits no distension and no mass. There is tenderness in the left lower quadrant. There is guarding. There is no rigidity and no rebound.  Lymphadenopathy:    She has no cervical adenopathy.  Neurological: She is alert and oriented to person, place, and time. No cranial nerve deficit. She exhibits normal muscle tone. Coordination normal.  Skin: Skin is warm and dry. No rash noted. She is not diaphoretic.  Psychiatric: She has a normal mood and affect. Her behavior is normal. Judgment and thought content normal.          Results for orders placed in visit on 05/09/13  POCT CBC      Result Value Range   WBC 8.5  4.6 - 10.2 K/uL   Lymph, poc 1.1  0.6 - 3.4   POC LYMPH PERCENT 12.6  10 - 50 %L   MID (cbc) 0.2  0 - 0.9   POC MID % 2.6  0 - 12 %M   POC Granulocyte 7.2 (*) 2 - 6.9   Granulocyte percent 84.8 (*) 37 - 80 %G   RBC 5.18  4.04 - 5.48 M/uL   Hemoglobin 14.7  12.2 - 16.2 g/dL   HCT, POC 16.1  09.6 - 47.9 %   MCV 89.0  80 - 97 fL   MCH, POC 28.4  27 - 31.2 pg   MCHC 31.9  31.8 - 35.4 g/dL   RDW, POC 04.5     Platelet Count, POC 282  142 - 424 K/uL   MPV 10.4  0 - 99.8 fL  POCT URINALYSIS DIPSTICK      Result Value Range   Color, UA yellow     Clarity, UA clear     Glucose, UA neg     Bilirubin, UA neg     Ketones, UA neg     Spec Grav, UA 1.010     Blood, UA trace-intact     pH, UA 5.0     Protein, UA neg     Urobilinogen, UA 0.2     Nitrite, UA neg     Leukocytes, UA Negative     [facility] reading (PRIMARY) by  Dr. Katrinka Blazing.  AAS: NAD   Assessment & Plan:  Abdominal pain, LLQ - Plan: POCT urinalysis dipstick, Comprehensive metabolic panel, Urine culture, Amylase,  Lipase, DG Abd 2 Views  Other malaise and fatigue - Plan: POCT CBC, Comprehensive metabolic panel  SLE (systemic lupus erythematosus) - Plan: POCT SEDIMENTATION RATE  Laryngitis - Plan: POCT CBC  1. Abdominal pain LLQ:  New.  No associated symptoms other than mild constipation; normal WBC count.  Monitor over  next 48-72 hours.  Treat constipation with scheduled stool softener at home. If develops fever/chills/sweats or diarrhea, will evaluate and treat for diverticulitis. 2.  Malaise and fatigue:  New.  Associated with recent arthralgias/myalgias and treated with Prednisone per rheumatology; also recent URI symptoms; normal WBC count; obtain ESR.  Supportive care with rest, fluids. 3. SLE:  With worsening symptoms recently; rheumatology recently prescribed steroid taper; obtain ESR. 4.  Laryngitis:  New.  Onset two weeks ago; normal WBC count; monitor; voice rest, hydration.  No orders of the defined types were placed in this encounter.

## 2013-05-10 LAB — AMYLASE: Amylase: 43 U/L (ref 0–105)

## 2013-05-10 LAB — COMPREHENSIVE METABOLIC PANEL
ALT: 36 U/L — ABNORMAL HIGH (ref 0–35)
BUN: 17 mg/dL (ref 6–23)
CO2: 23 mEq/L (ref 19–32)
Calcium: 9.5 mg/dL (ref 8.4–10.5)
Chloride: 104 mEq/L (ref 96–112)
Creat: 0.82 mg/dL (ref 0.50–1.10)
Glucose, Bld: 107 mg/dL — ABNORMAL HIGH (ref 70–99)
Total Bilirubin: 0.3 mg/dL (ref 0.3–1.2)

## 2013-05-11 ENCOUNTER — Telehealth: Payer: Self-pay | Admitting: Radiology

## 2013-05-11 ENCOUNTER — Telehealth: Payer: Self-pay

## 2013-05-11 NOTE — Telephone Encounter (Signed)
PATIENT IS REQUESTING A REFILL ON LORAZEPAM 1MG . SHE STATES SHE IS TAKING PREDNISONE AND ALWAYS TAKES LORAZEPAM WITH IT. HER PHARMACY CANNOT TAKE IT OVER THE COMPUTER. BEST PHONE [phone] (HOME)  PHARMACY CHOICE IS [facility].   MBC

## 2013-05-11 NOTE — Telephone Encounter (Signed)
Please advise on Ativan rx request faxed from [facility]

## 2013-05-12 LAB — URINE CULTURE: Colony Count: 55000

## 2013-05-12 MED ORDER — LORAZEPAM 1 MG PO TABS: 1.0000 mg | ORAL_TABLET | Freq: Two times a day (BID) | ORAL | Status: DC | PRN

## 2013-05-12 NOTE — Telephone Encounter (Signed)
Please call in Ativan to [facility].

## 2013-05-12 NOTE — Telephone Encounter (Signed)
Called patient to advise this is done.

## 2013-05-13 ENCOUNTER — Encounter: Payer: Self-pay | Admitting: Family Medicine

## 2013-05-31 ENCOUNTER — Ambulatory Visit (INDEPENDENT_AMBULATORY_CARE_PROVIDER_SITE_OTHER): Payer: Medicare Other | Admitting: Family Medicine

## 2013-05-31 ENCOUNTER — Encounter: Payer: Self-pay | Admitting: Family Medicine

## 2013-05-31 ENCOUNTER — Telehealth: Payer: Self-pay | Admitting: *Deleted

## 2013-05-31 VITALS — BP 108/71 | HR 91 | Temp 98.4°F | Resp 16 | Ht 64.5 in | Wt 209.0 lb

## 2013-05-31 DIAGNOSIS — F988 Other specified behavioral and emotional disorders with onset usually occurring in childhood and adolescence: Secondary | ICD-10-CM

## 2013-05-31 DIAGNOSIS — R109 Unspecified abdominal pain: Secondary | ICD-10-CM

## 2013-05-31 DIAGNOSIS — I10 Essential (primary) hypertension: Secondary | ICD-10-CM

## 2013-05-31 DIAGNOSIS — F411 Generalized anxiety disorder: Secondary | ICD-10-CM

## 2013-05-31 DIAGNOSIS — M359 Systemic involvement of connective tissue, unspecified: Secondary | ICD-10-CM

## 2013-05-31 DIAGNOSIS — F418 Other specified anxiety disorders: Secondary | ICD-10-CM

## 2013-05-31 DIAGNOSIS — Z780 Asymptomatic menopausal state: Secondary | ICD-10-CM

## 2013-05-31 MED ORDER — LORAZEPAM 1 MG PO TABS: 1.0000 mg | ORAL_TABLET | Freq: Two times a day (BID) | ORAL | Status: DC | PRN

## 2013-05-31 MED ORDER — AMPHETAMINE-DEXTROAMPHETAMINE 10 MG PO TABS: 10.0000 mg | ORAL_TABLET | Freq: Two times a day (BID) | ORAL | Status: DC | PRN

## 2013-05-31 MED ORDER — ESTRADIOL 1 MG PO TABS: 1.0000 mg | ORAL_TABLET | Freq: Every morning | ORAL | Status: DC

## 2013-05-31 NOTE — Telephone Encounter (Signed)
Faxed prescription to patient's pharmacy [phone] and confirmation page received at 1:34 pm, per Dr Katrinka Blazing.

## 2013-05-31 NOTE — Progress Notes (Signed)
[address]   [place], [place]  [address]   [phone]  Subjective:    Patient ID: Abigail [name], female    DOB: 1960/12/16, 52 y.o.   MRN: [id number]  HPI This 52 y.o. female presents for six month follow-up for the following:  1.  HTN:  Six month follow-up; reports compliance with Losartan 100mg  daily; taking HCT 12.5mg  twice weekly.  Denies chest pain, shortness of breath; +leg swelling but mild this summer.  Compliance with medication; good tolerance with medication; good symptom control.  2.  Shingles: suffered recently with outbreak of shingles along R ear and neck; treated with Valtrex by Dr. Perrin Maltese.  Clearing up nicely; suspected shingles due to pain associated with rash.  3.  Venous stasis: stable this summer; using HCTZ twice weekly for hand and leg swelling.  4.  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  5.  Abdominal pain LLQ: improved from last visit.  Has intermittent persistent LLQ pain but mild; no associated fever, nausea, vomiting, diarrhea, bloody stools, constipation.  No associated urinary symptoms. CBC, CMET, u/a, urine culture, amylase, lipase all negative at visit 05/08/13.  ESR of 21 at last visit.  6.  ADD: stable; using Adderall twice weekly with studies.  Needs refill.  7. Anxiety: stable; continues to use Ativan once daily; needs refill.  Emotionally doing really well; son to get married 03/2014; very elaborate wedding to be planned.  Enjoying not working.  Plans to take classes in the fall.  8.  Menopause:  Ran out of Estradiol; requesting refill; plans to take 1/2 tablet and wean to off as soon as possible.  Review of Systems  Constitutional: Negative for fever, chills, diaphoresis and fatigue.  Respiratory: Negative for cough, shortness of breath, wheezing and stridor.   Cardiovascular: Positive for leg swelling. Negative for chest pain and palpitations.  Gastrointestinal: Positive for  abdominal pain. Negative for nausea, vomiting, diarrhea, constipation, blood in stool, abdominal distention, anal bleeding and rectal pain.  Genitourinary: Negative for dysuria, urgency, frequency, hematuria and genital sores.  Skin: Positive for rash.  Neurological: Negative for dizziness, tremors, seizures, syncope, facial asymmetry, speech difficulty, weakness, light-headedness, numbness and headaches.  Psychiatric/Behavioral: Positive for sleep disturbance and decreased concentration. Negative for suicidal ideas, self-injury and dysphoric mood. The patient is nervous/anxious.     Past Medical History  Diagnosis Date  . Lupus   . Migraine   . Complication of anesthesia     Sjogrens Syndrome  . Sjogren's syndrome   . Vasculitis     "autoimmune"  . Diverticulitis   . Gastritis   . Duodenitis   . Pancreatitis   . Fibromyalgia   . Perforation bowel     "microperforation"  . Asthma   . Melanoma 2008    Back; [place].  . Depression   . GERD (gastroesophageal reflux disease)   . Hypertension   . Urticaria   . Obstructive sleep apnea 12/25/2012    Past Surgical History  Procedure Laterality Date  . Melanoma excision    . Tmj arthroplasty  1970  . Appendectomy    . Cholecystectomy    . Pancreas surgery      sphincterotomy  . Abdominoplasty    . Liver biopsy    . Breast surgery    . Anterior cervical decomp/discectomy fusion  10/29/2011    Procedure: ANTERIOR CERVICAL DECOMPRESSION/DISCECTOMY FUSION 2 LEVELS;  Surgeon: Karn Cassis;  Location: [facility];  Service: Neurosurgery;  Laterality: N/A;  Cervical five-six,Cervical six-seven nterior cervical decompression/diskectomy, fusion, plate  . Sigmoidectomy  03/2012    done at [facility]  . Abdominal hysterectomy      Partial; Ovaries intact.  DUB/fibroids.  . Colonoscopy  11/11/2008  . Sleep study  10/11/2012    +severe OSA.  [facility].    Prior to Admission medications   Medication Sig Start Date End Date Taking?  Authorizing Provider  albuterol (PROVENTIL HFA;VENTOLIN HFA) 108 (90 BASE) MCG/ACT inhaler Inhale 2 puffs into the lungs every 6 (six) hours as needed. For shortness of breath. 01/10/12  Yes Chelle S Jeffery, PA-C  albuterol (PROVENTIL) (5 MG/ML) 0.5% nebulizer solution Take 0.5 mLs (2.5 mg total) by nebulization every 4 (four) hours as needed for wheezing or shortness of breath. 11/16/12  Yes Stephanie Coup Street, MD  amphetamine-dextroamphetamine (ADDERALL) 10 MG tablet Take 1 tablet (10 mg total) by mouth 2 (two) times daily as needed. For ADHD 05/31/13  Yes Ethelda Chick, MD  estradiol (ESTRACE) 1 MG tablet Take 1 tablet (1 mg total) by mouth every morning. 05/31/13  Yes Ethelda Chick, MD  LORazepam (ATIVAN) 1 MG tablet Take 1 tablet (1 mg total) by mouth 2 (two) times daily as needed. For anxiety 05/31/13  Yes Ethelda Chick, MD  potassium chloride SA (K-DUR,KLOR-CON) 20 MEQ tablet Take 1 tablet (20 mEq total) by mouth daily. 01/04/13  Yes Ethelda Chick, MD  ranitidine (ZANTAC) 150 MG tablet Take 150 mg by mouth daily. For GERD   Yes Historical Provider, MD  zolpidem (AMBIEN) 10 MG tablet Take 10 mg by mouth at bedtime as needed. For insomnia   Yes Historical Provider, MD  losartan (COZAAR) 100 MG tablet Take 100 mg by mouth every morning. 02/23/12 02/22/13  Dois Davenport, MD    Allergies  Allergen Reactions  . Meperidine Hcl Other (See Comments)    tachycardia  . Shrimp (Shellfish Allergy) Shortness Of Breath  . Doxycycline Hives  . Latex Other (See Comments)    Breathing problems.  . Promethazine Hcl Other (See Comments)    "uncontrolled vomiting"  . Sulfonamide Derivatives Hives  . Tetracyclines & Related Rash    History   Social History  . Marital Status: Married    Spouse Name: Nadine Counts    Number of Children: 2  . Years of Education: N/A   Occupational History  .  [employer]   Social History Main Topics  . Smoking status: Never Smoker   . Smokeless tobacco: Never Used  .  Alcohol Use: Yes     Comment: 01/20/12 once/month  . Drug Use: No  . Sexually Active: Yes -- Female partner(s)   Other Topics Concern  . Not on file   Social History Narrative   Marital status: married x 28 years to [place]; happily married; no abuse      Children: 2 children (60 yo son, 3 yo daughter); no grandchildren      Lives with: husband, daughter      Employment:  [employer] 2009-2013.  Resigned 08/2012.  Bachelor degrees in communication.        Tobacco:  None      Alcohol:  Once weekly; wine      Drugs: none      Exercise:  none                Family History  Problem Relation Age of Onset  . Colon cancer Paternal Uncle   .  Colon polyps Sister   . Depression Sister     suicide  . Colon polyps Brother   . Colon polyps Mother   . Dementia Mother   . Heart disease Father 90    CHF  . Macular degeneration Father        Objective:   Physical Exam  Nursing note and vitals reviewed. Constitutional: She is oriented to person, place, and time. She appears well-developed and well-nourished. No distress.  HENT:  Head: Normocephalic and atraumatic.  Right Ear: External ear normal.  Left Ear: External ear normal.  Nose: Nose normal.  Mouth/Throat: Oropharynx is clear and moist.  Eyes: Conjunctivae and EOM are normal. Pupils are equal, round, and reactive to light.  Neck: Normal range of motion. Neck supple. No JVD present. No thyromegaly present.  Cardiovascular: Normal rate, regular rhythm, normal heart sounds and intact distal pulses.  Exam reveals no gallop and no friction rub.   No murmur heard. Pulmonary/Chest: Effort normal and breath sounds normal. She has no wheezes. She has no rales.  Abdominal: Soft. Bowel sounds are normal. She exhibits no distension and no mass. There is tenderness in the left lower quadrant. There is no rebound and no guarding.  Lymphadenopathy:    She has no cervical adenopathy.  Neurological: She is alert and oriented to person, place, and time.    Skin: She is not diaphoretic.  Healing wound R lateral neck.  Psychiatric: She has a normal mood and affect. Her behavior is normal. Judgment and thought content normal.       Assessment & Plan:  Anxiety state, unspecified  Essential hypertension, benign  ADD (attention deficit disorder)  Abdominal pain, unspecified site   1. Anxiety: stable; refill of Ativan provided. 2.  HTN: controlled; no change in management; normal labs one month ago.   3.  ADD: controlled; refill of Adderall provided for PRN use. 4.  Abdominal pain LLQ: improved; benign exam in office. If persists or worsens, RTC. 5.  Menopause: stable; refill of Estradiol provided; encourage to weaning to 1/2 tablet daily and then lower. 6.  Connective Tissue disorder:  Stable; s/p recent rheumatology follow-up.  No change to management at that time.  Meds ordered this encounter  Medications  . LORazepam (ATIVAN) 1 MG tablet    Sig: Take 1 tablet (1 mg total) by mouth 2 (two) times daily as needed. For anxiety    Dispense:  30 tablet    Refill:  5  . amphetamine-dextroamphetamine (ADDERALL) 10 MG tablet    Sig: Take 1 tablet (10 mg total) by mouth 2 (two) times daily as needed. For ADHD    Dispense:  60 tablet    Refill:  0  . estradiol (ESTRACE) 1 MG tablet    Sig: Take 1 tablet (1 mg total) by mouth every morning.    Dispense:  90 tablet    Refill:  3

## 2013-06-04 ENCOUNTER — Encounter: Payer: Self-pay | Admitting: Family Medicine

## 2013-06-05 ENCOUNTER — Telehealth: Payer: Self-pay

## 2013-06-05 NOTE — Telephone Encounter (Signed)
PT IS REQUESTING A NEW BLOOD MED CALLED IN TO [facility] PT STATES HAS SPOKE WITH DR Katrinka Blazing TODAY

## 2013-06-06 ENCOUNTER — Ambulatory Visit (INDEPENDENT_AMBULATORY_CARE_PROVIDER_SITE_OTHER): Payer: Medicare Other | Admitting: Family Medicine

## 2013-06-06 VITALS — BP 140/96 | HR 84 | Temp 97.5°F | Resp 16 | Ht 65.0 in | Wt 212.4 lb

## 2013-06-06 DIAGNOSIS — I10 Essential (primary) hypertension: Secondary | ICD-10-CM

## 2013-06-06 MED ORDER — SPIRONOLACTONE 25 MG PO TABS: 25.0000 mg | ORAL_TABLET | Freq: Every day | ORAL | Status: DC

## 2013-06-06 NOTE — Progress Notes (Signed)
[address]   [place], [place]  [address]   [phone]  Subjective:    Patient ID: Abigail [name], female    DOB: 22-Mar-1961, 52 y.o.   MRN: [id number]  HPI This 52 y.o. female presents for evaluation of hypertension.  Blood pressure running high; home diastolic readings up as high as 105.  Flushes and gets sweaty.  Chronic swelling issues since age 20 when at ideal body weight.  Under significant stress currently.     Review of Systems  Constitutional: Positive for diaphoresis. Negative for fever, chills and fatigue.  Respiratory: Negative for shortness of breath, wheezing and stridor.   Cardiovascular: Positive for leg swelling. Negative for chest pain and palpitations.  Gastrointestinal: Negative for abdominal pain.  Neurological: Negative for dizziness, tremors, seizures, syncope, facial asymmetry, speech difficulty, weakness, light-headedness, numbness and headaches.   Past Medical History  Diagnosis Date  . Lupus   . Migraine   . Complication of anesthesia     Sjogrens Syndrome  . Sjogren's syndrome   . Vasculitis     "autoimmune"  . Diverticulitis   . Gastritis   . Duodenitis   . Pancreatitis   . Fibromyalgia   . Perforation bowel     "microperforation"  . Asthma   . Melanoma 2008    Back; [place].  . Depression   . GERD (gastroesophageal reflux disease)   . Hypertension   . Urticaria   . Obstructive sleep apnea 12/25/2012   Current Outpatient Prescriptions on File Prior to Visit  Medication Sig Dispense Refill  . albuterol (PROVENTIL HFA;VENTOLIN HFA) 108 (90 BASE) MCG/ACT inhaler Inhale 2 puffs into the lungs every 6 (six) hours as needed. For shortness of breath.  1 Inhaler  2  . albuterol (PROVENTIL) (5 MG/ML) 0.5% nebulizer solution Take 0.5 mLs (2.5 mg total) by nebulization every 4 (four) hours as needed for wheezing or shortness of breath.  20 mL  2  . amphetamine-dextroamphetamine (ADDERALL) 10 MG tablet Take 1 tablet (10 mg total) by mouth 2 (two)  times daily as needed. For ADHD  60 tablet  0  . estradiol (ESTRACE) 1 MG tablet Take 1 tablet (1 mg total) by mouth every morning.  90 tablet  3  . hydrochlorothiazide (HYDRODIURIL) 12.5 MG tablet Take 1 tablet by mouth daily.      Marland Kitchen LORazepam (ATIVAN) 1 MG tablet Take 1 tablet (1 mg total) by mouth 2 (two) times daily as needed. For anxiety  60 tablet  3  . potassium chloride SA (K-DUR,KLOR-CON) 20 MEQ tablet Take 1 tablet (20 mEq total) by mouth daily.  30 tablet  5  . ranitidine (ZANTAC) 150 MG tablet Take 150 mg by mouth daily. For GERD      . sertraline (ZOLOFT) 100 MG tablet Take 1 tablet by mouth daily. Take 100 mg by mouth daily.      Marland Kitchen triamcinolone cream (KENALOG) 0.5 % Apply topically. Apply topically 2 (two) times daily.      Marland Kitchen zolpidem (AMBIEN) 10 MG tablet Take 10 mg by mouth at bedtime as needed. For insomnia      . losartan (COZAAR) 100 MG tablet Take 100 mg by mouth every morning.       No current facility-administered medications on file prior to visit.       Objective:   Physical Exam  Nursing note and vitals reviewed. Constitutional: She is oriented to person, place, and time. She appears well-developed and well-nourished. No distress.  HENT:  Head: Normocephalic and atraumatic.  Eyes: Conjunctivae are normal. Pupils are equal, round, and reactive to light.  Neck: Normal range of motion. Neck supple. No thyromegaly present.  Cardiovascular: Normal rate, regular rhythm, normal heart sounds and intact distal pulses.  Exam reveals no gallop and no friction rub.   No murmur heard. Pulmonary/Chest: Effort normal and breath sounds normal. She has no wheezes. She has no rales.  Lymphadenopathy:    She has no cervical adenopathy.  Neurological: She is alert and oriented to person, place, and time.  Skin: She is not diaphoretic.  Psychiatric: She has a normal mood and affect. Her behavior is normal.       Assessment & Plan:  Essential hypertension, benign - Plan:  spironolactone (ALDACTONE) 25 MG tablet   1. HTN: uncontrolled; worsening; add Spironolactone 25mg  daily; stop HCTZ.  Continue Losartan 100mg  daily.  RTC one month for BMET.  Meds ordered this encounter  Medications  . spironolactone (ALDACTONE) 25 MG tablet    Sig: Take 1 tablet (25 mg total) by mouth daily.    Dispense:  30 tablet    Refill:  5

## 2013-06-07 ENCOUNTER — Encounter: Payer: Self-pay | Admitting: Family Medicine

## 2013-06-07 ENCOUNTER — Other Ambulatory Visit: Payer: Self-pay

## 2013-06-07 MED ORDER — LOSARTAN POTASSIUM 100 MG PO TABS: 100.0000 mg | ORAL_TABLET | Freq: Every morning | ORAL | Status: DC

## 2013-06-08 ENCOUNTER — Other Ambulatory Visit: Payer: Self-pay

## 2013-06-08 MED ORDER — LOSARTAN POTASSIUM 100 MG PO TABS: 100.0000 mg | ORAL_TABLET | Freq: Every morning | ORAL | Status: DC

## 2013-06-08 NOTE — Telephone Encounter (Signed)
Patient presented to office for OV on 06/06/13 to discuss further.

## 2013-06-10 NOTE — Progress Notes (Signed)
Left msg with pt husband for pt to call to schedule f/u with Dr. Katrinka Blazing.

## 2013-06-10 NOTE — Progress Notes (Signed)
Appt made for 07/19/13.

## 2013-06-24 ENCOUNTER — Encounter: Payer: Self-pay | Admitting: Family Medicine

## 2013-06-24 DIAGNOSIS — R739 Hyperglycemia, unspecified: Secondary | ICD-10-CM

## 2013-06-30 ENCOUNTER — Encounter: Payer: Self-pay | Admitting: Family Medicine

## 2013-07-04 ENCOUNTER — Other Ambulatory Visit (INDEPENDENT_AMBULATORY_CARE_PROVIDER_SITE_OTHER): Payer: Medicare Other | Admitting: Family Medicine

## 2013-07-04 DIAGNOSIS — R7309 Other abnormal glucose: Secondary | ICD-10-CM

## 2013-07-04 DIAGNOSIS — R739 Hyperglycemia, unspecified: Secondary | ICD-10-CM

## 2013-07-04 LAB — COMPREHENSIVE METABOLIC PANEL
ALT: 32 U/L (ref 0–35)
AST: 23 U/L (ref 0–37)
Alkaline Phosphatase: 68 U/L (ref 39–117)
Creat: 0.88 mg/dL (ref 0.50–1.10)
Sodium: 136 mEq/L (ref 135–145)
Total Bilirubin: 0.4 mg/dL (ref 0.3–1.2)
Total Protein: 6.6 g/dL (ref 6.0–8.3)

## 2013-07-04 LAB — HEMOGLOBIN A1C: Hgb A1c MFr Bld: 5.7 % — ABNORMAL HIGH (ref <5.7)

## 2013-07-05 ENCOUNTER — Ambulatory Visit (INDEPENDENT_AMBULATORY_CARE_PROVIDER_SITE_OTHER): Payer: Medicare Other | Admitting: Family Medicine

## 2013-07-05 ENCOUNTER — Telehealth: Payer: Self-pay | Admitting: *Deleted

## 2013-07-05 ENCOUNTER — Encounter: Payer: Self-pay | Admitting: Family Medicine

## 2013-07-05 VITALS — BP 122/78 | HR 86 | Temp 97.5°F | Resp 16 | Ht 65.0 in | Wt 210.0 lb

## 2013-07-05 DIAGNOSIS — E876 Hypokalemia: Secondary | ICD-10-CM

## 2013-07-05 DIAGNOSIS — I10 Essential (primary) hypertension: Secondary | ICD-10-CM

## 2013-07-05 DIAGNOSIS — R739 Hyperglycemia, unspecified: Secondary | ICD-10-CM

## 2013-07-05 DIAGNOSIS — G4700 Insomnia, unspecified: Secondary | ICD-10-CM

## 2013-07-05 DIAGNOSIS — R7309 Other abnormal glucose: Secondary | ICD-10-CM

## 2013-07-05 MED ORDER — LORAZEPAM 1 MG PO TABS: 1.0000 mg | ORAL_TABLET | Freq: Two times a day (BID) | ORAL | Status: DC | PRN

## 2013-07-05 MED ORDER — ZOLPIDEM TARTRATE 10 MG PO TABS: 10.0000 mg | ORAL_TABLET | Freq: Every evening | ORAL | Status: DC | PRN

## 2013-07-05 NOTE — Addendum Note (Signed)
Addended by: Ethelda Chick on: 07/05/2013 01:36 PM   Modules accepted: Orders

## 2013-07-05 NOTE — Telephone Encounter (Signed)
Faxed prescription to patient's pharmacy [facility]) [phone] Lorazepam 1 mg and zolpidem 10 mg, per Dr Katrinka Blazing. Confirmation page received at 11:57p.

## 2013-07-05 NOTE — Progress Notes (Signed)
[address]   [place], [place]  [address]   [phone]  Subjective:    Patient ID: Abigail [name], female    DOB: 1961/08/30, 52 y.o.   MRN: [id number]  HPI This 52 y.o. female presents for evaluation of the following:  1. Hyperglycemia: new.  Blood sugar slightly elevated at last visit; presenting for fasting glucose and HgbA1c results from yesterday.  Interested in nutrition consultation.  Eats a lot of bread and sweets.  No regular sodas or sweet tea or fruit juice.  No formal exercise. Has gained weight recently.  2.  HTN:  Tolerating Spironolactone 25mg  1/2 daily with excellent BP control; home BP running 110-125/70-80s.  Denies CP/palp/SOB/leg swelling; denies HA/dizziness/focal weakness/blurred vision.  3. Excessive sweating: has been really sweating a lot lately; concerned secondary to Spironolactone but not willing to stop medication; has restarted Estrogen but sweating has not improved; sweating does not feel like a hot flash.   4. Insomnia: did not sleep well at all last night; needs refill of Lorazepam and Ambien; only takes 1/2 Ambien 10mg ; aware of new guidelines recommending 5mg  Ambien nightly for females.   Review of Systems  Constitutional: Positive for diaphoresis. Negative for fever, chills and fatigue.  Respiratory: Negative for cough, shortness of breath, wheezing and stridor.   Cardiovascular: Negative for chest pain, palpitations and leg swelling.  Neurological: Negative for dizziness, tremors, seizures, syncope, facial asymmetry, speech difficulty, weakness, light-headedness, numbness and headaches.    Past Medical History  Diagnosis Date  . Lupus   . Migraine   . Complication of anesthesia     Sjogrens Syndrome  . Sjogren's syndrome   . Vasculitis     "autoimmune"  . Diverticulitis   . Gastritis   . Duodenitis   . Pancreatitis   . Fibromyalgia   . Perforation bowel     "microperforation"  . Asthma   . Melanoma 2008    Back; [place].  .  Depression   . GERD (gastroesophageal reflux disease)   . Hypertension   . Urticaria   . Obstructive sleep apnea 12/25/2012    Past Surgical History  Procedure Laterality Date  . Melanoma excision    . Tmj arthroplasty  1970  . Appendectomy    . Cholecystectomy    . Pancreas surgery      sphincterotomy  . Abdominoplasty    . Liver biopsy    . Breast surgery    . Anterior cervical decomp/discectomy fusion  10/29/2011    Procedure: ANTERIOR CERVICAL DECOMPRESSION/DISCECTOMY FUSION 2 LEVELS;  Surgeon: Karn Cassis;  Location: [facility];  Service: Neurosurgery;  Laterality: N/A;  Cervical five-six,Cervical six-seven nterior cervical decompression/diskectomy, fusion, plate  . Sigmoidectomy  03/2012    done at [facility]  . Abdominal hysterectomy      Partial; Ovaries intact.  DUB/fibroids.  . Colonoscopy  11/11/2008  . Sleep study  10/11/2012    +severe OSA.  [facility].    Prior to Admission medications   Medication Sig Start Date End Date Taking? Authorizing Provider  albuterol (PROVENTIL HFA;VENTOLIN HFA) 108 (90 BASE) MCG/ACT inhaler Inhale 2 puffs into the lungs every 6 (six) hours as needed. For shortness of breath. 01/10/12  Yes Chelle S Jeffery, PA-C  amphetamine-dextroamphetamine (ADDERALL) 10 MG tablet Take 1 tablet (10 mg total) by mouth 2 (two) times daily as needed. For ADHD 05/31/13  Yes Ethelda Chick, MD  estradiol (ESTRACE) 1 MG tablet Take 1 tablet (1 mg total) by mouth  every morning. 05/31/13  Yes Ethelda Chick, MD  LORazepam (ATIVAN) 1 MG tablet Take 1 tablet (1 mg total) by mouth 2 (two) times daily as needed. For anxiety 07/05/13  Yes Ethelda Chick, MD  losartan (COZAAR) 100 MG tablet Take 1 tablet (100 mg total) by mouth every morning. 06/08/13 06/08/14 Yes Ethelda Chick, MD  ranitidine (ZANTAC) 150 MG tablet Take 150 mg by mouth daily. For GERD   Yes Historical Provider, MD  spironolactone (ALDACTONE) 25 MG tablet Take 1 tablet (25 mg total) by mouth daily.  06/06/13  Yes Ethelda Chick, MD  albuterol (PROVENTIL) (5 MG/ML) 0.5% nebulizer solution Take 0.5 mLs (2.5 mg total) by nebulization every 4 (four) hours as needed for wheezing or shortness of breath. 11/16/12   Stephanie Coup Street, MD  hydrochlorothiazide (HYDRODIURIL) 12.5 MG tablet Take 1 tablet by mouth daily. 10/18/12   Historical Provider, MD  potassium chloride SA (K-DUR,KLOR-CON) 20 MEQ tablet Take 1 tablet (20 mEq total) by mouth daily. 01/04/13   Ethelda Chick, MD  sertraline (ZOLOFT) 100 MG tablet Take 1 tablet by mouth daily. Take 100 mg by mouth daily. 02/05/13   Historical Provider, MD  triamcinolone cream (KENALOG) 0.5 % Apply topically. Apply topically 2 (two) times daily. 12/23/12 12/23/13  Historical Provider, MD  zolpidem (AMBIEN) 10 MG tablet Take 1 tablet (10 mg total) by mouth at bedtime as needed. For insomnia 07/05/13   Ethelda Chick, MD    Allergies  Allergen Reactions  . Meperidine Hcl Other (See Comments)    tachycardia  . Shrimp [Shellfish Allergy] Shortness Of Breath  . Doxycycline Hives  . Latex Other (See Comments)    Breathing problems.  . Promethazine Hcl Other (See Comments)    "uncontrolled vomiting"  . Sulfonamide Derivatives Hives  . Tetracyclines & Related Rash    History   Social History  . Marital Status: Married    Spouse Name: Nadine Counts    Number of Children: 2  . Years of Education: N/A   Occupational History  .  [employer]   Social History Main Topics  . Smoking status: Never Smoker   . Smokeless tobacco: Never Used  . Alcohol Use: Yes     Comment: 01/20/12 once/month  . Drug Use: No  . Sexual Activity: Yes    Partners: Male   Other Topics Concern  . Not on file   Social History Narrative   Marital status: married x 28 years to [place]; happily married; no abuse      Children: 2 children (26 yo son, 85 yo daughter); no grandchildren      Lives with: husband, daughter      Employment:  [employer] 2009-2013.  Resigned 08/2012.  Bachelor degrees  in communication.        Tobacco:  None      Alcohol:  Once weekly; wine      Drugs: none      Exercise:  none                Family History  Problem Relation Age of Onset  . Colon cancer Paternal Uncle   . Colon polyps Sister   . Depression Sister     suicide  . Colon polyps Brother   . Colon polyps Mother   . Dementia Mother   . Heart disease Father 54    CHF  . Macular degeneration Father        Objective:   Physical Exam  Nursing note and vitals reviewed. Constitutional: She is oriented to person, place, and time. She appears well-developed and well-nourished. No distress.  HENT:  Head: Normocephalic and atraumatic.  Eyes: Conjunctivae and EOM are normal. Pupils are equal, round, and reactive to light.  Neck: Normal range of motion. Neck supple. No thyromegaly present.  Cardiovascular: Normal rate, regular rhythm and normal heart sounds.  Exam reveals no gallop and no friction rub.   No murmur heard. Pulmonary/Chest: Effort normal and breath sounds normal. She has no wheezes. She has no rales.  Lymphadenopathy:    She has no cervical adenopathy.  Neurological: She is alert and oriented to person, place, and time.  Skin: She is not diaphoretic.  Psychiatric: She has a normal mood and affect. Her behavior is normal. Judgment and thought content normal.    Results for orders placed in visit on 07/04/13  COMPREHENSIVE METABOLIC PANEL      Result Value Range   Sodium 136  135 - 145 mEq/L   Potassium 4.3  3.5 - 5.3 mEq/L   Chloride 109  96 - 112 mEq/L   CO2 23  19 - 32 mEq/L   Glucose, Bld 91  70 - 99 mg/dL   BUN 13  6 - 23 mg/dL   Creat 1.19  1.47 - 8.29 mg/dL   Total Bilirubin 0.4  0.3 - 1.2 mg/dL   Alkaline Phosphatase 68  39 - 117 U/L   AST 23  0 - 37 U/L   ALT 32  0 - 35 U/L   Total Protein 6.6  6.0 - 8.3 g/dL   Albumin 4.0  3.5 - 5.2 g/dL   Calcium 8.9  8.4 - 56.2 mg/dL  HEMOGLOBIN Z3Y      Result Value Range   Hemoglobin A1C 5.7 (*) <5.7 %   Mean  Plasma Glucose 117 (*) <117 mg/dL       Assessment & Plan:  Hyperglycemia - Plan: Ambulatory referral to diabetic education  Hypertension  Hypokalemia  Insomnia   1.  Hyperglycemia:  New. Counseled extensively regarding prediabetic state and dietary modification; refer for nutritional counseling.  Recommend weight loss and exercise. 2.  HTN: improved with Spironolactone; no change to management; normal K+. 3.  Hypokalemia: resolved with discontinuation of HCTZ. 4.  Insomnia: worsening; refill of Lorazepam and Ambien; reviewed current guidelines on Ambien; to limit dose to 1/2 Ambien 10mg  qhs.   Meds ordered this encounter  Medications  . zolpidem (AMBIEN) 10 MG tablet    Sig: Take 1 tablet (10 mg total) by mouth at bedtime as needed. For insomnia    Dispense:  30 tablet    Refill:  2  . LORazepam (ATIVAN) 1 MG tablet    Sig: Take 1 tablet (1 mg total) by mouth 2 (two) times daily as needed. For anxiety    Dispense:  60 tablet    Refill:  3

## 2013-07-07 ENCOUNTER — Encounter: Payer: Self-pay | Admitting: Internal Medicine

## 2013-07-19 ENCOUNTER — Ambulatory Visit: Payer: Medicare Other | Admitting: Family Medicine

## 2013-07-28 ENCOUNTER — Encounter: Payer: Self-pay | Admitting: Gastroenterology

## 2013-07-28 ENCOUNTER — Other Ambulatory Visit (INDEPENDENT_AMBULATORY_CARE_PROVIDER_SITE_OTHER): Payer: Medicare Other

## 2013-07-28 ENCOUNTER — Ambulatory Visit (INDEPENDENT_AMBULATORY_CARE_PROVIDER_SITE_OTHER): Payer: Medicare Other | Admitting: Gastroenterology

## 2013-07-28 ENCOUNTER — Telehealth: Payer: Self-pay | Admitting: Internal Medicine

## 2013-07-28 VITALS — BP 110/78 | HR 64 | Temp 98.5°F | Ht 65.0 in | Wt 208.4 lb

## 2013-07-28 DIAGNOSIS — K5900 Constipation, unspecified: Secondary | ICD-10-CM | POA: Insufficient documentation

## 2013-07-28 DIAGNOSIS — R109 Unspecified abdominal pain: Secondary | ICD-10-CM

## 2013-07-28 LAB — CBC WITH DIFFERENTIAL/PLATELET
Basophils Absolute: 0 10*3/uL (ref 0.0–0.1)
Basophils Relative: 0.7 % (ref 0.0–3.0)
Eosinophils Absolute: 0.1 10*3/uL (ref 0.0–0.7)
Lymphocytes Relative: 28.8 % (ref 12.0–46.0)
MCHC: 34.2 g/dL (ref 30.0–36.0)
Neutrophils Relative %: 58.2 % (ref 43.0–77.0)
RBC: 4.53 Mil/uL (ref 3.87–5.11)

## 2013-07-28 MED ORDER — DICYCLOMINE HCL 20 MG PO TABS: 20.0000 mg | ORAL_TABLET | Freq: Four times a day (QID) | ORAL | Status: DC

## 2013-07-28 NOTE — Telephone Encounter (Signed)
Patient states she is having problems with constipation. She has been taking Miralax daily. She had a bowel movement on Monday. Today, she is having left sided pain intermittently. She thinks it may be diverticulitis. Hx diverticulitis, colectomy. Scheduled with Doug Sou, PA today at 3:00 PM.

## 2013-07-28 NOTE — Patient Instructions (Addendum)
Please go to the basement level to have your labs drawn.  Get a bottle of Magnesium Citrate at the pharmacy, laxative section.  Drink as directed. We sent a prescription for Bentyl ( dicyclomine ) 20 mg. Take 2 tab daily as needed for cramps and spasms.  Call usThurs or Friday of this week with a progress report.

## 2013-07-28 NOTE — Progress Notes (Signed)
07/28/2013 Abigail [name] [id number] October 24, 1961   HISTORY OF PRESENT ILLNESS:  Patient is a 52 year old female who is a patient of Dr. Regino Schultze.  She has a history of recurrent diverticulitis and had a sigmoid resection last year.  She comes in today complaining of constipation, bloating, and abdominal pain.  She states that she usually does not have any issues with constipation but over the weekend she was not moving her bowels.  Over the next couple of days she took several doses of Miralax and passed some stool but says that she does not feel "cleaned out".  Having LLQ abdominal pains that come and go.  A lot of bloating and feels very full all the time.  Once again, all of these symptoms are new over the past week or so.  She thinks that the bloating and abdominal pains are due to the constipation and if we help that issue then the other symptoms will get better as well.  Last colonoscopy in 04/2008 showed only diverticulosis (this was performed in [place]).  No fever or chills.     Past Medical History  Diagnosis Date  . Lupus   . Migraine   . Complication of anesthesia     Sjogrens Syndrome  . Sjogren's syndrome   . Vasculitis     "autoimmune"  . Diverticulitis   . Gastritis   . Duodenitis   . Pancreatitis   . Fibromyalgia   . Perforation bowel     "microperforation"  . Asthma   . Melanoma 2008    Back; [place].  . Depression   . GERD (gastroesophageal reflux disease)   . Hypertension   . Urticaria   . Obstructive sleep apnea 12/25/2012   Past Surgical History  Procedure Laterality Date  . Melanoma excision    . Tmj arthroplasty  1970  . Appendectomy    . Cholecystectomy    . Pancreas surgery      sphincterotomy  . Abdominoplasty    . Liver biopsy    . Breast surgery    . Anterior cervical decomp/discectomy fusion  10/29/2011    Procedure: ANTERIOR CERVICAL DECOMPRESSION/DISCECTOMY FUSION 2 LEVELS;  Surgeon: Karn Cassis;  Location: [facility];  Service: Neurosurgery;   Laterality: N/A;  Cervical five-six,Cervical six-seven nterior cervical decompression/diskectomy, fusion, plate  . Sigmoidectomy  03/2012    done at [facility]  . Abdominal hysterectomy      Partial; Ovaries intact.  DUB/fibroids.  . Colonoscopy  11/11/2008  . Sleep study  10/11/2012    +severe OSA.  [facility].    reports that she has never smoked. She has never used smokeless tobacco. She reports that  drinks alcohol. She reports that she does not use illicit drugs. family history includes Colon cancer in her paternal uncle; Colon polyps in her brother, mother, and sister; Dementia in her mother; Depression in her sister; Heart disease (age of onset: 27) in her father; Macular degeneration in her father. Allergies  Allergen Reactions  . Meperidine Hcl Other (See Comments)    tachycardia  . Shrimp [Shellfish Allergy] Shortness Of Breath  . Doxycycline Hives  . Latex Other (See Comments)    Breathing problems.  . Promethazine Hcl Other (See Comments)    "uncontrolled vomiting"  . Sulfonamide Derivatives Hives  . Tetracyclines & Related Rash      Outpatient Encounter Prescriptions as of 07/28/2013  Medication Sig Dispense Refill  . albuterol (PROVENTIL HFA;VENTOLIN HFA) 108 (90 BASE) MCG/ACT inhaler Inhale 2  puffs into the lungs every 6 (six) hours as needed. For shortness of breath.  1 Inhaler  2  . estradiol (ESTRACE) 1 MG tablet Take 1 tablet (1 mg total) by mouth every morning.  90 tablet  3  . LORazepam (ATIVAN) 1 MG tablet Take 1 tablet (1 mg total) by mouth 2 (two) times daily as needed. For anxiety  60 tablet  3  . losartan (COZAAR) 100 MG tablet Take 1 tablet (100 mg total) by mouth every morning.  90 tablet  1  . ranitidine (ZANTAC) 150 MG tablet Take 150 mg by mouth daily. For GERD      . spironolactone (ALDACTONE) 25 MG tablet Take 1 tablet (25 mg total) by mouth daily.  30 tablet  5  . zolpidem (AMBIEN) 10 MG tablet Take 1 tablet (10 mg total) by mouth at bedtime as  needed. For insomnia  30 tablet  2  . [DISCONTINUED] amphetamine-dextroamphetamine (ADDERALL) 10 MG tablet Take 1 tablet (10 mg total) by mouth 2 (two) times daily as needed. For ADHD  60 tablet  0  . dicyclomine (BENTYL) 20 MG tablet Take 1 tablet (20 mg total) by mouth every 6 (six) hours.  60 tablet  0   No facility-administered encounter medications on file as of 07/28/2013.     REVIEW OF SYSTEMS  : All other systems reviewed and negative except where noted in the History of Present Illness.   PHYSICAL EXAM: BP 110/78  Pulse 64  Temp(Src) 98.5 F (36.9 C)  Ht 5\' 5"  (1.651 m)  Wt 208 lb 6.4 oz (94.53 kg)  BMI 34.68 kg/m2 General: Well developed white female in no acute distress Head: Normocephalic and atraumatic Eyes:  sclerae anicteric, conjunctiva pink. Ears: Normal auditory acuity Lungs: Clear throughout to auscultation Heart: Regular rate and rhythm Abdomen: Soft, non-distended.  BS present.  Mild lower abdominal TTP without R/R/G. Musculoskeletal: Symmetrical with no gross deformities  Skin: No lesions on visible extremities Extremities: No edema  Neurological: Alert oriented x 4, grossly nonfocal Psychological:  Alert and cooperative. Normal mood and affect  ASSESSMENT AND PLAN: -Constipation:  Will drink a bottle of mag citrate tonight.  This episode of constipation is new for her but if it tends to be an ongoing problem from now on then she will need to begin taking Miralax daily.   -Left sided abdominal pain and bloating:  Patient thinks that these symptoms may be related to the constipation.  Will check CBC since she has a history of diverticulitis, but I doubt that this is in fact diverticulitis.  Will give bentyl 20 mg to take BID prn for cramps and spasms. -History of recurrent diverticulitis s/p sigmoid resection in 2013  *She will call the office later this week to give [place] an update on how she is feeling.  ? CT scan if pain continues.

## 2013-07-29 NOTE — Progress Notes (Signed)
Reviewed and agree.

## 2013-08-10 ENCOUNTER — Telehealth: Payer: Self-pay

## 2013-08-10 DIAGNOSIS — R109 Unspecified abdominal pain: Secondary | ICD-10-CM

## 2013-08-10 NOTE — Telephone Encounter (Signed)
Pt is calling because she is needing 2 medication refills the first one is for her Adderall 5mg  she wanted to let Dr Katrinka Blazing know she is really needing this to help her concentrate in the [place] tournament is going to be in. The second one is her dicyclomine that one she states needs to be called in at [facility] Call back numbers are either home [phone] or cell  [phone]

## 2013-08-11 MED ORDER — DICYCLOMINE HCL 20 MG PO TABS: 20.0000 mg | ORAL_TABLET | Freq: Four times a day (QID) | ORAL | Status: DC

## 2013-08-11 MED ORDER — AMPHETAMINE-DEXTROAMPHETAMINE 5 MG PO TABS: 5.0000 mg | ORAL_TABLET | Freq: Three times a day (TID) | ORAL | Status: DC

## 2013-08-11 NOTE — Telephone Encounter (Signed)
Call -- 1.  Adderall 5mg  tid ready for pick up.  2.  Rx for Dicyclomine sent to [facility].

## 2013-08-12 NOTE — Telephone Encounter (Signed)
Patient notified and voiced understanding.

## 2013-08-16 ENCOUNTER — Telehealth: Payer: Self-pay | Admitting: Family Medicine

## 2013-08-16 MED ORDER — AMPHETAMINE-DEXTROAMPHETAMINE 10 MG PO TABS: 10.0000 mg | ORAL_TABLET | Freq: Two times a day (BID) | ORAL | Status: DC

## 2013-08-16 NOTE — Telephone Encounter (Signed)
Spoke with patient; leaving for [place] to play in tournament.  Plans to follow-up next week to address GI issues.  Watching diet closely.  Lost no weight.  Provided rx Adderall 5mg  tid; pharmacy only has 10mg  tablets in stock.  Requesting 10 mg bid. A/P: ADD: Shredded Adderall 5mg  tid rx; rx provided to husband for Adderall 10mg  bid #60 no refills.  Will schedule OV in upcoming week for GI issues.

## 2013-08-25 ENCOUNTER — Encounter: Payer: Self-pay | Admitting: Family Medicine

## 2013-08-25 ENCOUNTER — Ambulatory Visit (INDEPENDENT_AMBULATORY_CARE_PROVIDER_SITE_OTHER): Payer: Medicare Other | Admitting: Family Medicine

## 2013-08-25 VITALS — BP 115/74 | HR 77 | Temp 98.3°F | Resp 16 | Ht 65.0 in | Wt 208.0 lb

## 2013-08-25 DIAGNOSIS — Z23 Encounter for immunization: Secondary | ICD-10-CM

## 2013-08-25 DIAGNOSIS — J45909 Unspecified asthma, uncomplicated: Secondary | ICD-10-CM

## 2013-08-25 DIAGNOSIS — Z79899 Other long term (current) drug therapy: Secondary | ICD-10-CM

## 2013-08-25 DIAGNOSIS — F988 Other specified behavioral and emotional disorders with onset usually occurring in childhood and adolescence: Secondary | ICD-10-CM

## 2013-08-25 DIAGNOSIS — R1032 Left lower quadrant pain: Secondary | ICD-10-CM

## 2013-08-25 DIAGNOSIS — I10 Essential (primary) hypertension: Secondary | ICD-10-CM

## 2013-08-25 DIAGNOSIS — J683 Other acute and subacute respiratory conditions due to chemicals, gases, fumes and vapors: Secondary | ICD-10-CM

## 2013-08-25 DIAGNOSIS — N390 Urinary tract infection, site not specified: Secondary | ICD-10-CM

## 2013-08-25 DIAGNOSIS — M359 Systemic involvement of connective tissue, unspecified: Secondary | ICD-10-CM

## 2013-08-25 DIAGNOSIS — K5900 Constipation, unspecified: Secondary | ICD-10-CM

## 2013-08-25 LAB — POCT UA - MICROSCOPIC ONLY
Crystals, Ur, HPF, POC: NEGATIVE
RBC, urine, microscopic: NEGATIVE

## 2013-08-25 LAB — CBC WITH DIFFERENTIAL/PLATELET
Basophils Absolute: 0 10*3/uL (ref 0.0–0.1)
Basophils Relative: 0 % (ref 0–1)
Eosinophils Absolute: 0.2 10*3/uL (ref 0.0–0.7)
Eosinophils Relative: 3 % (ref 0–5)
HCT: 41.4 % (ref 36.0–46.0)
Hemoglobin: 14 g/dL (ref 12.0–15.0)
Lymphocytes Relative: 29 % (ref 12–46)
MCHC: 33.8 g/dL (ref 30.0–36.0)
MCV: 83 fL (ref 78.0–100.0)
Monocytes Absolute: 0.6 10*3/uL (ref 0.1–1.0)
Monocytes Relative: 11 % (ref 3–12)
Neutro Abs: 3.1 10*3/uL (ref 1.7–7.7)

## 2013-08-25 LAB — POCT URINALYSIS DIPSTICK
Bilirubin, UA: NEGATIVE
Glucose, UA: NEGATIVE
Ketones, UA: NEGATIVE
Leukocytes, UA: NEGATIVE
Protein, UA: NEGATIVE
Spec Grav, UA: 1.015
pH, UA: 5

## 2013-08-25 MED ORDER — CIPROFLOXACIN HCL 500 MG PO TABS: 500.0000 mg | ORAL_TABLET | Freq: Two times a day (BID) | ORAL | Status: DC

## 2013-08-25 MED ORDER — ALBUTEROL SULFATE HFA 108 (90 BASE) MCG/ACT IN AERS: 2.0000 | INHALATION_SPRAY | Freq: Four times a day (QID) | RESPIRATORY_TRACT | Status: DC | PRN

## 2013-08-25 NOTE — Progress Notes (Signed)
[address]   [place], [place]  [address]   [phone]  Subjective:    Patient ID: Abigail [name], female    DOB: 12/30/1960, 52 y.o.   MRN: [id number]  HPI This 52 y.o. female presents for evaluation of the following:  1.  Connective Tissue Disorder: needs labs including Creatinine, LFTs.  Took Prednisone for five days to get through recent crochet tournament.  Developed facial rash after sun exposure; this is a new finding for patient.  2.  UTI: took some of husband's Cipro; not sure what urine will look like.  Cipro 1 daily for four days.  Dysuria, aching bladder/discomfort.  Frequency.  Urgency.  +sweats.  No chills.  No fevers.  No nausea or back pain.  3. LLQ pain: constipation; s/p GI consultation 07/28/13; recommended MgCitrate; took it; took again.  Taking Miralax daily.  Scar tissue in LLQ.  No melena or bloody stools.  Started Miralax one week ago.  4. ADD: Adderall really helped with crochet tournament.  5. HTN: concerned about low potassium level.  BP is fine; if notices a little swelling, take HCTZ and KCL; taking HCTZ once every ten days.  6. Asthma: needs refill.  Allergies are new for past two years; taking Claritin or Panadol eye drops.  7. Glucose Intolerance: has not gone to nutritionist.    Review of Systems  Constitutional: Negative for fever, chills, diaphoresis and fatigue.  HENT: Positive for congestion, postnasal drip, rhinorrhea and sneezing.   Eyes: Positive for itching.  Respiratory: Negative for shortness of breath, wheezing and stridor.   Cardiovascular: Negative for chest pain, palpitations and leg swelling.  Gastrointestinal: Positive for abdominal pain. Negative for nausea, vomiting, diarrhea, constipation, blood in stool and abdominal distention.  Endocrine: Negative for cold intolerance, heat intolerance, polydipsia, polyphagia and polyuria.  Genitourinary: Negative for dysuria, urgency, frequency, hematuria and flank pain.  Neurological:  Negative for dizziness, syncope, weakness, light-headedness and headaches.  Psychiatric/Behavioral: Positive for decreased concentration.   Past Medical History  Diagnosis Date  . Lupus   . Migraine   . Complication of anesthesia     Sjogrens Syndrome  . Sjogren's syndrome   . Vasculitis     "autoimmune"  . Diverticulitis   . Gastritis   . Duodenitis   . Pancreatitis   . Fibromyalgia   . Perforation bowel     "microperforation"  . Asthma   . Melanoma 2008    Back; [place]afe[place].  . Depression   . GERD (gastroesophageal reflux disease)   . Hypertension   . Urticaria   . Obstructive sleep apnea 12/25/2012   Past Surgical History  Procedure Laterality Date  . Melanoma excision    . Tmj arthroplasty  1970  . Appendectomy    . Cholecystectomy    . Pancreas surgery      sphincterotomy  . Abdominoplasty    . Liver biopsy    . Breast surgery    . Anterior cervical decomp/discectomy fusion  10/29/2011    Procedure: ANTERIOR CERVICAL DECOMPRESSION/DISCECTOMY FUSION 2 LEVELS;  Surgeon: Karn Cassis;  Location: [facility];  Service: Neurosurgery;  Laterality: N/A;  Cervical five-six,Cervical six-seven nterior cervical decompression/diskectomy, fusion, plate  . Sigmoidectomy  03/2012    done at [facility]  . Abdominal hysterectomy      Partial; Ovaries intact.  DUB/fibroids.  . Colonoscopy  11/11/2008  . Sleep study  10/11/2012    +severe OSA.  [facility].  . Colonoscopy N/A 09/20/2013  Procedure: COLONOSCOPY;  Surgeon: Hart Carwin, MD;  Location: [facility];  Service: Endoscopy;  Laterality: N/A;   Allergies  Allergen Reactions  . Meperidine Hcl Other (See Comments)    tachycardia  . Shrimp [Shellfish Allergy] Shortness Of Breath  . Doxycycline Hives  . Latex Other (See Comments)    Breathing problems.  . Promethazine Hcl Other (See Comments)    "uncontrolled vomiting"  . Sulfonamide Derivatives Hives  . Tetracyclines & Related Rash   Current Outpatient  Prescriptions on File Prior to Visit  Medication Sig Dispense Refill  . amphetamine-dextroamphetamine (ADDERALL) 10 MG tablet Take 1 tablet (10 mg total) by mouth 2 (two) times daily.  60 tablet  0  . estradiol (ESTRACE) 1 MG tablet Take 1 tablet (1 mg total) by mouth every morning.  90 tablet  3  . losartan (COZAAR) 100 MG tablet Take 1 tablet (100 mg total) by mouth every morning.  90 tablet  1  . zolpidem (AMBIEN) 10 MG tablet Take 1 tablet (10 mg total) by mouth at bedtime as needed. For insomnia  30 tablet  2   No current facility-administered medications on file prior to visit.   History   Social History  . Marital Status: Married    Spouse Name: Nadine Counts    Number of Children: 2  . Years of Education: N/A   Occupational History  .  [employer]   Social History Main Topics  . Smoking status: Never Smoker   . Smokeless tobacco: Never Used  . Alcohol Use: Yes     Comment: 01/20/12 once/month  . Drug Use: No  . Sexual Activity: Yes    Partners: Male   Other Topics Concern  . Not on file   Social History Narrative   Marital status: married x 28 years to [place]; happily married; no abuse      Children: 2 children (27 yo son, 74 yo daughter); no grandchildren      Lives with: husband, daughter      Employment:  [employer] 2009-2013.  Resigned 08/2012.  Bachelor degrees in communication.        Tobacco:  None      Alcohol:  Once weekly; wine      Drugs: none      Exercise:  none                   Objective:   Physical Exam  Constitutional: She is oriented to person, place, and time. She appears well-developed and well-nourished. No distress.  HENT:  Head: Normocephalic and atraumatic.  Right Ear: External ear normal.  Left Ear: External ear normal.  Nose: Nose normal.  Mouth/Throat: Oropharynx is clear and moist.  Eyes: Conjunctivae and EOM are normal. Pupils are equal, round, and reactive to light.  Neck: Normal range of motion. Neck supple. Carotid bruit is not present. No  thyromegaly present.  Cardiovascular: Normal rate, regular rhythm, normal heart sounds and intact distal pulses.  Exam reveals no gallop and no friction rub.   No murmur heard. Pulmonary/Chest: Effort normal and breath sounds normal. She has no wheezes. She has no rales.  Abdominal: Soft. Bowel sounds are normal. She exhibits no distension and no mass. There is no tenderness. There is no rebound and no guarding.  Lymphadenopathy:    She has no cervical adenopathy.  Neurological: She is alert and oriented to person, place, and time. No cranial nerve deficit.  Skin: Skin is warm and dry. No rash noted. She  is not diaphoretic. There is erythema. No pallor.  Mild erythematous rash B maxillary regions.  Psychiatric: She has a normal mood and affect. Her behavior is normal.       Assessment & Plan:  Need for prophylactic vaccination and inoculation against influenza - Plan: Flu Vaccine QUAD 36+ mos IM  Essential hypertension, benign - Plan: POCT UA - Microscopic Only, POCT urinalysis dipstick, CBC with Differential, Comprehensive metabolic panel, Sedimentation rate, Urine culture  ADD (attention deficit disorder)  Diffuse disease of connective tissue  Encounter for long-term (current) use of other medications  Reactive airways  Unspecified constipation  Abdominal pain, LLQ  UTI (urinary tract infection)  1.  HTN: controlled; obtain labs; continue current medications. 2.  Connective tissue disorder: stable; obtain labs. 3. Encounter for monitoring of long term medication: stable; obtain labs. 4.  Reactive airway disease: refill of Albuterol provided; continue allergy medications.  5.  Abdominal pain LLQ: stable/improving; s/p GI consultation. 6.  Constipation: stable; improving with Miralax. 7.  UTI: recent; s/p Cipro one daily x 4 days; obtain urine and urine culture.  rx for Cipro provided to complete course. 8.  ADD: improved with stimulant at recent tournament. 9.  S/p flu  vaccine.  Meds ordered this encounter  Medications  . hydrochlorothiazide (MICROZIDE) 12.5 MG capsule    Sig: Take 12.5 mg by mouth daily.  . potassium chloride (KLOR-CON) 20 MEQ packet    Sig: Take 20 mEq by mouth 2 (two) times daily.  Marland Kitchen albuterol (PROVENTIL HFA;VENTOLIN HFA) 108 (90 BASE) MCG/ACT inhaler    Sig: Inhale 2 puffs into the lungs every 6 (six) hours as needed. For shortness of breath.    Dispense:  1 Inhaler    Refill:  2  . DISCONTD: ciprofloxacin (CIPRO) 500 MG tablet    Sig: Take 1 tablet (500 mg total) by mouth 2 (two) times daily.    Dispense:  20 tablet    Refill:  0   Nilda Simmer, M.D.  [facility] [address] [place], [place]  [address] [phone] phone [phone] fax

## 2013-08-26 LAB — COMPREHENSIVE METABOLIC PANEL
ALT: 25 U/L (ref 0–35)
AST: 20 U/L (ref 0–37)
Albumin: 4.3 g/dL (ref 3.5–5.2)
BUN: 13 mg/dL (ref 6–23)
Calcium: 9.5 mg/dL (ref 8.4–10.5)
Chloride: 106 mEq/L (ref 96–112)
Creat: 0.85 mg/dL (ref 0.50–1.10)
Glucose, Bld: 85 mg/dL (ref 70–99)
Potassium: 4.2 mEq/L (ref 3.5–5.3)
Total Bilirubin: 0.3 mg/dL (ref 0.3–1.2)

## 2013-08-27 ENCOUNTER — Telehealth: Payer: Self-pay

## 2013-08-27 LAB — URINE CULTURE
Colony Count: NO GROWTH
Organism ID, Bacteria: NO GROWTH

## 2013-08-27 NOTE — Telephone Encounter (Signed)
Patient very anxious for her lab results, please advise.

## 2013-08-27 NOTE — Telephone Encounter (Signed)
Patient called to speak to someone clinical to get her lab results. She was seen on Tuesday 08/24/13 and says her labs should be ready because it is Friday. Please advise. Patient aware that Dr. Katrinka Blazing has to review them first.  Thank you!  [phone]

## 2013-08-28 ENCOUNTER — Encounter: Payer: Self-pay | Admitting: Family Medicine

## 2013-08-28 ENCOUNTER — Telehealth: Payer: Self-pay | Admitting: *Deleted

## 2013-08-28 NOTE — Telephone Encounter (Signed)
Can you please review labs. Patient very anxious and continues to call. Thank you

## 2013-08-29 ENCOUNTER — Encounter: Payer: Self-pay | Admitting: Family Medicine

## 2013-08-29 ENCOUNTER — Telehealth: Payer: Self-pay

## 2013-08-29 NOTE — Telephone Encounter (Signed)
Patient calling again to speak to someone clinical or pa about her lab results. She feels as if someone clinical or physicians assistant can review results if Dr. Katrinka Blazing is busy. She is understanding. Please advise.   [phone]

## 2013-08-29 NOTE — Telephone Encounter (Signed)
Abigail [name] Spoke with pt about lab results.

## 2013-09-14 ENCOUNTER — Telehealth: Payer: Self-pay | Admitting: Internal Medicine

## 2013-09-15 NOTE — Telephone Encounter (Signed)
Patient saw Doug Sou, PA in September for abdominal pain and constipation. She continues to have intermittent abdominal pain and constipation. She is taking Miralax with some results. She is now having some upper abdominal pain also. Scheduled with Dr. Juanda Chance tomorrow at 3:15 PM.

## 2013-09-16 ENCOUNTER — Ambulatory Visit (INDEPENDENT_AMBULATORY_CARE_PROVIDER_SITE_OTHER): Payer: Medicare Other | Admitting: Internal Medicine

## 2013-09-16 ENCOUNTER — Encounter: Payer: Self-pay | Admitting: Internal Medicine

## 2013-09-16 VITALS — BP 106/70 | HR 64 | Ht 65.0 in | Wt 211.8 lb

## 2013-09-16 DIAGNOSIS — K5732 Diverticulitis of large intestine without perforation or abscess without bleeding: Secondary | ICD-10-CM

## 2013-09-16 DIAGNOSIS — R109 Unspecified abdominal pain: Secondary | ICD-10-CM

## 2013-09-16 DIAGNOSIS — K5900 Constipation, unspecified: Secondary | ICD-10-CM

## 2013-09-16 MED ORDER — PREPOPIK 10-3.5-12 MG-GM-GM PO PACK: 1.0000 | PACK | ORAL | Status: DC

## 2013-09-16 NOTE — Progress Notes (Signed)
Abigail [name] 12/15/1960 MRN [id number]        History of Present Illness:  This is a 51-year-old white female with the severe constipation and obstipation since  sigmoid resection for perforated diverticulitis in May 2014. The resection was done at [facility]. Last colonoscopy was in January 2010. We have been following her for many years for irritable bowel syndrome as well as for Sjogren's syndrome and postcholecystectomy pancreatitis. She was seen in our office in September 17 for constipation and given magnesium citrate and MiraLax. She has not had good results. She reports having last bowel movement several days ago in spite of taking MiraLax several times a day. There has been no fever. She is having left lower quadrant abdominal pain   Past Medical History  Diagnosis Date  . Lupus   . Migraine   . Complication of anesthesia     Sjogrens Syndrome  . Sjogren's syndrome   . Vasculitis     "autoimmune"  . Diverticulitis   . Gastritis   . Duodenitis   . Pancreatitis   . Fibromyalgia   . Perforation bowel     "microperforation"  . Asthma   . Melanoma 2008    Back; [place]afe[place].  . Depression   . GERD (gastroesophageal reflux disease)   . Hypertension   . Urticaria   . Obstructive sleep apnea 12/25/2012   Past Surgical History  Procedure Laterality Date  . Melanoma excision    . Tmj arthroplasty  1970  . Appendectomy    . Cholecystectomy    . Pancreas surgery      sphincterotomy  . Abdominoplasty    . Liver biopsy    . Breast surgery    . Anterior cervical decomp/discectomy fusion  10/29/2011    Procedure: ANTERIOR CERVICAL DECOMPRESSION/DISCECTOMY FUSION 2 LEVELS;  Surgeon: Ernesto M Botero;  Location: [facility];  Service: Neurosurgery;  Laterality: N/A;  Cervical five-six,Cervical six-seven nterior cervical decompression/diskectomy, fusion, plate  . Sigmoidectomy  03/2012    done at [facility]  . Abdominal hysterectomy      Partial; Ovaries intact.  DUB/fibroids.  .  Colonoscopy  11/11/2008  . Sleep study  10/11/2012    +severe OSA.  [facility].    reports that she has never smoked. She has never used smokeless tobacco. She reports that she drinks alcohol. She reports that she does not use illicit drugs. family history includes Colon cancer in her paternal uncle; Colon polyps in her brother, mother, and sister; Dementia in her mother; Depression in her sister; Heart disease (age of onset: 70) in her father; Macular degeneration in her father. Allergies  Allergen Reactions  . Meperidine Hcl Other (See Comments)    tachycardia  . Shrimp [Shellfish Allergy] Shortness Of Breath  . Doxycycline Hives  . Latex Other (See Comments)    Breathing problems.  . Promethazine Hcl Other (See Comments)    "uncontrolled vomiting"  . Sulfonamide Derivatives Hives  . Tetracyclines & Related Rash        Review of Systems: Abdominal distention bloating constipation and pain  The remainder of the 10 point ROS is negative except as outlined in H&P   Physical Exam: General appearance  Well developed, in no distress. Eyes- non icteric. HEENT nontraumatic, normocephalic. Mouth no lesions, tongue papillated, no cheilosis. Neck supple without adenopathy, thyroid not enlarged, no carotid bruits, no JVD. Lungs Clear to auscultation bilaterally. Cor normal S1, normal S2, regular rhythm, no murmur,  quiet precordium. Abdomen: Soft abdomen with quiet   bowel sounds. No tympany. No rebound. Diffuse tenderness more so in the left lower and left middle quadrant. Also tenderness in the periumbilical area Rectal: Small amount of soft Hemoccult negative stool Extremities no pedal edema. Skin no lesions. Neurological alert and oriented x 3. Psychological normal mood and affect.  Assessment and Plan:  51-year-old white female with obstipation which may be related to recent surgery for  perforated diverticulitis.We need to consider  a sigmoid stricture. Or colonic inertia.  She is not taking any narcotics at the this point. Her abdominal exam is benign. There is no evidence of occult GI blood loss. In order to rule out any anatomic obstruction we will proceed with the colonoscopy. She will be started  on Kristalose 10 g twice a day, Amitiza 24 mcg twice a day and probiotic once a day. We have scheduled colonoscopy for 09/21/2013, will also check amylase, lipase and metabolic panel, sedimentation rate and TSH.  Sjogren's syndrome, ? SLE, followed by Dr StClair,[facility], [facility],  Family hx of colon polyps, several direct members, colon cancer in an uncle.  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   09/16/2013 Abigail [name]  

## 2013-09-16 NOTE — Patient Instructions (Addendum)
You have been scheduled for a colonoscopy. Please follow written instructions given to you at your visit today.  Please pick up your prep kit at the pharmacy within the next 1-3 days. If you use inhalers (even only as needed), please bring them with you on the day of your procedure. Your physician has requested that you go to www.startemmi.com and enter the access code given to you at your visit today. This web site gives a general overview about your procedure. However, you should still follow specific instructions given to you by our office regarding your preparation for the procedure.  We have given you samples of the following medication to take: Amitiza 24 mcg-Take 1 tablet twice daily Kristalose-Take 1 packet twice daily  Your physician has requested that you go to the basement for the following lab work on 09/20/13 CBC, Sed Rate, Amylase, Lipase, CMET  CC: Dr Nilda Simmer, Dr Victoriano Lain, [facility], [facility].

## 2013-09-17 ENCOUNTER — Telehealth: Payer: Self-pay | Admitting: *Deleted

## 2013-09-17 ENCOUNTER — Encounter: Payer: Self-pay | Admitting: Internal Medicine

## 2013-09-17 NOTE — Telephone Encounter (Signed)
Patient says she is unable to use Prepopik because it will cost her $80. She is unable to afford this. I have asked that she come by the office and I will give her a Moviprep sample instead. She verbalizes understanding.

## 2013-09-19 NOTE — H&P (View-Only) (Signed)
Abigail [name] 03-10-1961 MRN [id number]        History of Present Illness:  This is a 52 year old white female with the severe constipation and obstipation since  sigmoid resection for perforated diverticulitis in May 2014. The resection was done at [facility]. Last colonoscopy was in January 2010. We have been following her for many years for irritable bowel syndrome as well as for Sjogren's syndrome and postcholecystectomy pancreatitis. She was seen in our office in September 17 for constipation and given magnesium citrate and MiraLax. She has not had good results. She reports having last bowel movement several days ago in spite of taking MiraLax several times a day. There has been no fever. She is having left lower quadrant abdominal pain   Past Medical History  Diagnosis Date  . Lupus   . Migraine   . Complication of anesthesia     Sjogrens Syndrome  . Sjogren's syndrome   . Vasculitis     "autoimmune"  . Diverticulitis   . Gastritis   . Duodenitis   . Pancreatitis   . Fibromyalgia   . Perforation bowel     "microperforation"  . Asthma   . Melanoma 2008    Back; Tafeen.  . Depression   . GERD (gastroesophageal reflux disease)   . Hypertension   . Urticaria   . Obstructive sleep apnea 12/25/2012   Past Surgical History  Procedure Laterality Date  . Melanoma excision    . Tmj arthroplasty  1970  . Appendectomy    . Cholecystectomy    . Pancreas surgery      sphincterotomy  . Abdominoplasty    . Liver biopsy    . Breast surgery    . Anterior cervical decomp/discectomy fusion  10/29/2011    Procedure: ANTERIOR CERVICAL DECOMPRESSION/DISCECTOMY FUSION 2 LEVELS;  Surgeon: Karn Cassis;  Location: [facility];  Service: Neurosurgery;  Laterality: N/A;  Cervical five-six,Cervical six-seven nterior cervical decompression/diskectomy, fusion, plate  . Sigmoidectomy  03/2012    done at [facility]  . Abdominal hysterectomy      Partial; Ovaries intact.  DUB/fibroids.  .  Colonoscopy  11/11/2008  . Sleep study  10/11/2012    +severe OSA.  [facility].    reports that she has never smoked. She has never used smokeless tobacco. She reports that she drinks alcohol. She reports that she does not use illicit drugs. family history includes Colon cancer in her paternal uncle; Colon polyps in her brother, mother, and sister; Dementia in her mother; Depression in her sister; Heart disease (age of onset: 5) in her father; Macular degeneration in her father. Allergies  Allergen Reactions  . Meperidine Hcl Other (See Comments)    tachycardia  . Shrimp [Shellfish Allergy] Shortness Of Breath  . Doxycycline Hives  . Latex Other (See Comments)    Breathing problems.  . Promethazine Hcl Other (See Comments)    "uncontrolled vomiting"  . Sulfonamide Derivatives Hives  . Tetracyclines & Related Rash        Review of Systems: Abdominal distention bloating constipation and pain  The remainder of the 10 point ROS is negative except as outlined in H&P   Physical Exam: General appearance  Well developed, in no distress. Eyes- non icteric. HEENT nontraumatic, normocephalic. Mouth no lesions, tongue papillated, no cheilosis. Neck supple without adenopathy, thyroid not enlarged, no carotid bruits, no JVD. Lungs Clear to auscultation bilaterally. Cor normal S1, normal S2, regular rhythm, no murmur,  quiet precordium. Abdomen: Soft abdomen with quiet  bowel sounds. No tympany. No rebound. Diffuse tenderness more so in the left lower and left middle quadrant. Also tenderness in the periumbilical area Rectal: Small amount of soft Hemoccult negative stool Extremities no pedal edema. Skin no lesions. Neurological alert and oriented x 3. Psychological normal mood and affect.  Assessment and Plan:  52 year old white female with obstipation which may be related to recent surgery for  perforated diverticulitis.We need to consider  a sigmoid stricture. Or colonic inertia.  She is not taking any narcotics at the this point. Her abdominal exam is benign. There is no evidence of occult GI blood loss. In order to rule out any anatomic obstruction we will proceed with the colonoscopy. She will be started  on Kristalose 10 g twice a day, Amitiza 24 mcg twice a day and probiotic once a day. We have scheduled colonoscopy for 09/21/2013, will also check amylase, lipase and metabolic panel, sedimentation rate and TSH.  Sjogren's syndrome, ? SLE, followed by Dr Isaiah Serge, [facility],  Family hx of colon polyps, several direct members, colon cancer in an uncle.  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   09/16/2013 Lina Sar

## 2013-09-19 NOTE — Interval H&P Note (Signed)
History and Physical Interval Note:  09/19/2013 9:14 PM  Abigail [name]  has presented today for surgery, with the diagnosis of Constipation [564.00] Diverticulitis [562.11]  The various methods of treatment have been discussed with the patient and family. After consideration of risks, benefits and other options for treatment, the patient has consented to  Procedure(s): COLONOSCOPY (N/A) as a surgical intervention .  The patient's history has been reviewed, patient examined, no change in status, stable for surgery.  I have reviewed the patient's chart and labs.  Questions were answered to the patient's satisfaction.     Lina Sar

## 2013-09-20 ENCOUNTER — Encounter (HOSPITAL_COMMUNITY): Payer: Self-pay

## 2013-09-20 ENCOUNTER — Ambulatory Visit (HOSPITAL_COMMUNITY)
Admission: RE | Admit: 2013-09-20 | Discharge: 2013-09-20 | Disposition: A | Discharge status: Home / Self Care | Payer: Medicare Other | Source: Ambulatory Visit | Attending: Internal Medicine | Admitting: Internal Medicine

## 2013-09-20 ENCOUNTER — Encounter (HOSPITAL_COMMUNITY)
Admission: RE | Disposition: A | Discharge status: Home / Self Care | Payer: Self-pay | Source: Ambulatory Visit | Attending: Internal Medicine

## 2013-09-20 DIAGNOSIS — K5900 Constipation, unspecified: Secondary | ICD-10-CM | POA: Insufficient documentation

## 2013-09-20 DIAGNOSIS — K589 Irritable bowel syndrome without diarrhea: Secondary | ICD-10-CM | POA: Insufficient documentation

## 2013-09-20 DIAGNOSIS — R142 Eructation: Secondary | ICD-10-CM | POA: Insufficient documentation

## 2013-09-20 DIAGNOSIS — Z800 Family history of malignant neoplasm of digestive organs: Secondary | ICD-10-CM | POA: Insufficient documentation

## 2013-09-20 DIAGNOSIS — K5289 Other specified noninfective gastroenteritis and colitis: Secondary | ICD-10-CM | POA: Insufficient documentation

## 2013-09-20 DIAGNOSIS — I10 Essential (primary) hypertension: Secondary | ICD-10-CM | POA: Insufficient documentation

## 2013-09-20 DIAGNOSIS — R1033 Periumbilical pain: Secondary | ICD-10-CM | POA: Insufficient documentation

## 2013-09-20 DIAGNOSIS — R141 Gas pain: Secondary | ICD-10-CM | POA: Insufficient documentation

## 2013-09-20 DIAGNOSIS — K5732 Diverticulitis of large intestine without perforation or abscess without bleeding: Secondary | ICD-10-CM

## 2013-09-20 DIAGNOSIS — Z85820 Personal history of malignant melanoma of skin: Secondary | ICD-10-CM | POA: Insufficient documentation

## 2013-09-20 DIAGNOSIS — K219 Gastro-esophageal reflux disease without esophagitis: Secondary | ICD-10-CM | POA: Insufficient documentation

## 2013-09-20 DIAGNOSIS — G4733 Obstructive sleep apnea (adult) (pediatric): Secondary | ICD-10-CM | POA: Insufficient documentation

## 2013-09-20 DIAGNOSIS — Z980 Intestinal bypass and anastomosis status: Secondary | ICD-10-CM | POA: Insufficient documentation

## 2013-09-20 DIAGNOSIS — M329 Systemic lupus erythematosus, unspecified: Secondary | ICD-10-CM | POA: Insufficient documentation

## 2013-09-20 DIAGNOSIS — M3500 Sicca syndrome, unspecified: Secondary | ICD-10-CM | POA: Insufficient documentation

## 2013-09-20 HISTORY — PX: COLONOSCOPY: SHX5424

## 2013-09-20 SURGERY — COLONOSCOPY
Anesthesia: Moderate Sedation

## 2013-09-20 MED ORDER — MIDAZOLAM HCL 5 MG/5ML IJ SOLN
INTRAMUSCULAR | Status: DC | PRN
  Administered 2013-09-20 (×4): 2 mg via INTRAVENOUS

## 2013-09-20 MED ORDER — FENTANYL CITRATE 0.05 MG/ML IJ SOLN
INTRAMUSCULAR | Status: DC | PRN
  Administered 2013-09-20 (×5): 25 ug via INTRAVENOUS

## 2013-09-20 MED ORDER — FENTANYL CITRATE 0.05 MG/ML IJ SOLN
INTRAMUSCULAR | Status: AC
  Filled 2013-09-20: qty 2

## 2013-09-20 MED ORDER — MIDAZOLAM HCL 10 MG/2ML IJ SOLN
INTRAMUSCULAR | Status: AC
  Filled 2013-09-20: qty 2

## 2013-09-20 MED ORDER — METRONIDAZOLE 250 MG PO TABS: 250.0000 mg | ORAL_TABLET | Freq: Three times a day (TID) | ORAL | Status: DC

## 2013-09-20 MED ORDER — DIPHENHYDRAMINE HCL 50 MG/ML IJ SOLN
INTRAMUSCULAR | Status: DC | PRN
  Administered 2013-09-20 (×2): 12.5 mg via INTRAVENOUS

## 2013-09-20 MED ORDER — PANTOPRAZOLE SODIUM 40 MG PO TBEC: 40.0000 mg | DELAYED_RELEASE_TABLET | Freq: Every day | ORAL | Status: DC

## 2013-09-20 MED ORDER — DIPHENHYDRAMINE HCL 50 MG/ML IJ SOLN
INTRAMUSCULAR | Status: AC
  Filled 2013-09-20: qty 1

## 2013-09-20 MED ORDER — SODIUM CHLORIDE 0.9 % IV SOLN: INTRAVENOUS | Status: DC

## 2013-09-20 NOTE — Interval H&P Note (Signed)
History and Physical Interval Note:  09/20/2013 3:08 PM  Abigail [name]  has presented today for surgery, with the diagnosis of Constipation [564.00] Diverticulitis [562.11]  The various methods of treatment have been discussed with the patient and family. After consideration of risks, benefits and other options for treatment, the patient has consented to  Procedure(s): COLONOSCOPY (N/A) as a surgical intervention .  The patient's history has been reviewed, patient examined, no change in status, stable for surgery.  I have reviewed the patient's chart and labs.  Questions were answered to the patient's satisfaction.     Lina Sar

## 2013-09-20 NOTE — Op Note (Signed)
[facility] [address] [place], [address]   COLONOSCOPY PROCEDURE REPORT  PATIENT: Abigail, [name]  MR#: [id number] BIRTHDATE: 1961-04-03 , 52  yrs. old GENDER: Female ENDOSCOPIST: Hart Carwin, MD REFERRED BY:  Nilda Simmer, M.D. PROCEDURE DATE:  09/20/2013 PROCEDURE:   Colonoscopy with biopsy ASA CLASS:   Class II INDICATIONS:periumbilical abdominal pain, Bloating, Constipation, and s/p sigmoid resection for perforatewd diverticulitis in May 2013 at [facility], now progressively constipated, bloating,.  , last colonoscopy 05/2009, family hx of colon polyps MEDICATIONS: These medications were titrated to patient response per physician's verbal order, Versed 8 mg IV, Fentanyl 125 mcg IV, and Benadryl 25 mg IV  DESCRIPTION OF PROCEDURE:   After the risks and benefits and of the procedure were explained, informed consent was obtained.  A digital rectal exam revealed no abnormalities of the rectum.    The Pentax Ped Colon [id number]  endoscope was introduced through the anus and advanced to the terminal ileum which was intubated for a short distance .  The quality of the prep was excellent, using MoviPrep . The instrument was then slowly withdrawn as the colon was fully examined.     COLON FINDINGS: There was evidence of a prior end-to-end colo-colonic surgical anastomosis in the sigmoid colon. at 20cm. The lumen was widely patent. There was a polyp-like granulation tissue  at the suture line which appeared polypoid and was friable, biuopsies were taken . There were multiple diverticuli proximal to the anastomosis, mostly shallow, there was no obstruction,The rest of colon mucosa was normal. Terminal ileum was normal- biopsies taken    Retroflexed views revealed no abnormalities.     The scope was then withdrawn from the patient and the procedure completed.  COMPLICATIONS: There were no complications. ENDOSCOPIC IMPRESSION: There was evidence of a prior  colo-colonic surgical anastomosis in the sigmoid colon , widely patent. no obstruction Polyp-like granulation tissue at the anastomosis mild descending colon divesrticulosis normal terminal ileum  RECOMMENDATIONS: Await pathology results continue Amitiza 24 ug po bid add Xifaxan 550 mg po bid x 10 days continue Probiotic consider SBCE or SBFT to assess small bowl follow up OV in 4 weeks   REPEAT EXAM: for Colonoscopy, pending biopsy results.  cc:  _______________________________ eSignedHart Carwin, MD 09/20/2013 3:53 PM     PATIENT NAME:  Abigail, [name] MR#: [id number]

## 2013-09-21 ENCOUNTER — Encounter: Payer: Self-pay | Admitting: Internal Medicine

## 2013-09-21 ENCOUNTER — Encounter (HOSPITAL_COMMUNITY): Payer: Self-pay | Admitting: Internal Medicine

## 2013-09-22 ENCOUNTER — Other Ambulatory Visit (INDEPENDENT_AMBULATORY_CARE_PROVIDER_SITE_OTHER): Payer: Medicare Other

## 2013-09-22 DIAGNOSIS — K5900 Constipation, unspecified: Secondary | ICD-10-CM

## 2013-09-22 DIAGNOSIS — R109 Unspecified abdominal pain: Secondary | ICD-10-CM

## 2013-09-22 DIAGNOSIS — K5732 Diverticulitis of large intestine without perforation or abscess without bleeding: Secondary | ICD-10-CM

## 2013-09-22 LAB — COMPREHENSIVE METABOLIC PANEL
AST: 27 U/L (ref 0–37)
Albumin: 3.9 g/dL (ref 3.5–5.2)
Alkaline Phosphatase: 72 U/L (ref 39–117)
BUN: 12 mg/dL (ref 6–23)
CO2: 25 mEq/L (ref 19–32)
Calcium: 9.3 mg/dL (ref 8.4–10.5)
Chloride: 106 mEq/L (ref 96–112)
Creatinine, Ser: 0.8 mg/dL (ref 0.4–1.2)
Glucose, Bld: 95 mg/dL (ref 70–99)
Potassium: 3.8 mEq/L (ref 3.5–5.1)
Sodium: 138 mEq/L (ref 135–145)
Total Protein: 7.3 g/dL (ref 6.0–8.3)

## 2013-09-22 LAB — CBC WITH DIFFERENTIAL/PLATELET
Basophils Absolute: 0 10*3/uL (ref 0.0–0.1)
Basophils Relative: 0.7 % (ref 0.0–3.0)
Eosinophils Absolute: 0.1 10*3/uL (ref 0.0–0.7)
Eosinophils Relative: 1.5 % (ref 0.0–5.0)
HCT: 39.6 % (ref 36.0–46.0)
Hemoglobin: 13.5 g/dL (ref 12.0–15.0)
Lymphocytes Relative: 25.4 % (ref 12.0–46.0)
Lymphs Abs: 1.1 10*3/uL (ref 0.7–4.0)
MCV: 83.2 fl (ref 78.0–100.0)
Monocytes Absolute: 0.4 10*3/uL (ref 0.1–1.0)
Monocytes Relative: 9.5 % (ref 3.0–12.0)
Neutro Abs: 2.7 10*3/uL (ref 1.4–7.7)
RBC: 4.76 Mil/uL (ref 3.87–5.11)
RDW: 13.4 % (ref 11.5–14.6)
WBC: 4.4 10*3/uL — ABNORMAL LOW (ref 4.5–10.5)

## 2013-09-22 LAB — SEDIMENTATION RATE: Sed Rate: 28 mm/hr — ABNORMAL HIGH (ref 0–22)

## 2013-09-23 ENCOUNTER — Other Ambulatory Visit: Payer: Self-pay | Admitting: *Deleted

## 2013-09-23 DIAGNOSIS — M3500 Sicca syndrome, unspecified: Secondary | ICD-10-CM

## 2013-09-23 DIAGNOSIS — R748 Abnormal levels of other serum enzymes: Secondary | ICD-10-CM

## 2013-09-23 DIAGNOSIS — R109 Unspecified abdominal pain: Secondary | ICD-10-CM

## 2013-09-28 ENCOUNTER — Ambulatory Visit (INDEPENDENT_AMBULATORY_CARE_PROVIDER_SITE_OTHER)
Admission: RE | Admit: 2013-09-28 | Discharge: 2013-09-28 | Disposition: A | Discharge status: Home / Self Care | Payer: Medicare Other | Source: Ambulatory Visit | Attending: Internal Medicine | Admitting: Internal Medicine

## 2013-09-28 DIAGNOSIS — M3500 Sicca syndrome, unspecified: Secondary | ICD-10-CM

## 2013-09-28 DIAGNOSIS — R109 Unspecified abdominal pain: Secondary | ICD-10-CM

## 2013-09-28 DIAGNOSIS — R748 Abnormal levels of other serum enzymes: Secondary | ICD-10-CM

## 2013-09-28 MED ORDER — IOHEXOL 300 MG/ML  SOLN
100.0000 mL | Freq: Once | INTRAMUSCULAR | Status: AC | PRN
  Administered 2013-09-28: 100 mL via INTRAVENOUS

## 2013-10-04 ENCOUNTER — Telehealth: Payer: Self-pay | Admitting: Internal Medicine

## 2013-10-04 DIAGNOSIS — R748 Abnormal levels of other serum enzymes: Secondary | ICD-10-CM

## 2013-10-04 MED ORDER — PREDNISONE 10 MG PO TABS: ORAL_TABLET | ORAL | Status: DC

## 2013-10-04 NOTE — Telephone Encounter (Signed)
Please repeat amy, lipase LFT's tomorrow and in 2 weeks. Start Prednisone 30 mg po qd x 2 weeks, then taper by 5 mg every week. Call if sideeffect are intolerable.OV 4-6 weeks

## 2013-10-04 NOTE — Telephone Encounter (Signed)
Spoke with patient and gave her recommendations. Rx sent. Labs in EPIC. Patient already has OV scheduled.

## 2013-10-04 NOTE — Telephone Encounter (Signed)
Spoke with patient and she states she still has fatigue and when she eats she has pain. All of the same symptoms. She wants to start on steroids. Also, she wants to know when she should repeat labs. Please,  Advise.

## 2013-10-05 ENCOUNTER — Telehealth: Payer: Self-pay | Admitting: Internal Medicine

## 2013-10-05 MED ORDER — DIAZEPAM 5 MG PO TABS: ORAL_TABLET | ORAL | Status: DC

## 2013-10-05 NOTE — Telephone Encounter (Signed)
Rx called to [facility]. Left a message for patient that Rx has been called in. Request she call to let me know she got my message.

## 2013-10-05 NOTE — Telephone Encounter (Signed)
Please go down ro Prednisone 25mg /day and add Diazepam 5 mg, #60, 1 po bid prn

## 2013-10-05 NOTE — Telephone Encounter (Signed)
Patient states she is having the "shakey feeling from the Prednisone."  She is asking for something for this. She tried Ativan last night and it did not help. Please, advise.

## 2013-10-06 ENCOUNTER — Other Ambulatory Visit (INDEPENDENT_AMBULATORY_CARE_PROVIDER_SITE_OTHER): Payer: Medicare Other

## 2013-10-06 DIAGNOSIS — R748 Abnormal levels of other serum enzymes: Secondary | ICD-10-CM

## 2013-10-06 LAB — AMYLASE: Amylase: 49 U/L (ref 27–131)

## 2013-10-06 LAB — HEPATIC FUNCTION PANEL
ALT: 27 U/L (ref 0–35)
AST: 26 U/L (ref 0–37)
Albumin: 3.8 g/dL (ref 3.5–5.2)

## 2013-10-06 LAB — LIPASE: Lipase: 46 U/L (ref 11.0–59.0)

## 2013-10-06 NOTE — Telephone Encounter (Signed)
Spoke with patient and confirmed receipt.

## 2013-10-14 ENCOUNTER — Telehealth: Payer: Self-pay | Admitting: *Deleted

## 2013-10-14 NOTE — Telephone Encounter (Signed)
Patient will come for labs next week. 

## 2013-10-14 NOTE — Telephone Encounter (Signed)
Message copied by Daphine Deutscher on Thu Oct 14, 2013  9:10 AM ------      Message from: Daphine Deutscher      Created: Mon Oct 04, 2013  1:25 PM       Call and remind patient due for labs for DB(amy, lipa, LFT) on 10/18/13. Lab in EPIC ------

## 2013-10-25 ENCOUNTER — Encounter: Payer: Self-pay | Admitting: Internal Medicine

## 2013-10-26 ENCOUNTER — Ambulatory Visit: Payer: Medicare Other | Admitting: Internal Medicine

## 2013-11-01 ENCOUNTER — Encounter: Payer: Self-pay | Admitting: Internal Medicine

## 2013-11-01 ENCOUNTER — Other Ambulatory Visit (INDEPENDENT_AMBULATORY_CARE_PROVIDER_SITE_OTHER): Payer: Medicare Other

## 2013-11-01 ENCOUNTER — Other Ambulatory Visit: Payer: Self-pay | Admitting: *Deleted

## 2013-11-01 DIAGNOSIS — K859 Acute pancreatitis without necrosis or infection, unspecified: Secondary | ICD-10-CM

## 2013-11-01 DIAGNOSIS — R748 Abnormal levels of other serum enzymes: Secondary | ICD-10-CM

## 2013-11-01 LAB — SEDIMENTATION RATE: Sed Rate: 29 mm/hr — ABNORMAL HIGH (ref 0–22)

## 2013-11-01 LAB — COMPREHENSIVE METABOLIC PANEL
Albumin: 3.8 g/dL (ref 3.5–5.2)
Alkaline Phosphatase: 71 U/L (ref 39–117)
BUN: 17 mg/dL (ref 6–23)
CO2: 25 mEq/L (ref 19–32)
GFR: 69.54 mL/min (ref >60.00)
Glucose, Bld: 104 mg/dL — ABNORMAL HIGH (ref 70–99)
Potassium: 3.8 mEq/L (ref 3.5–5.1)
Sodium: 136 mEq/L (ref 135–145)
Total Protein: 7.2 g/dL (ref 6.0–8.3)

## 2013-11-01 LAB — HEPATIC FUNCTION PANEL
ALT: 34 U/L (ref 0–35)
AST: 24 U/L (ref 0–37)
Albumin: 3.8 g/dL (ref 3.5–5.2)
Bilirubin, Direct: 0.1 mg/dL (ref 0.0–0.3)
Total Bilirubin: 0.3 mg/dL (ref 0.3–1.2)
Total Protein: 7.2 g/dL (ref 6.0–8.3)

## 2013-11-01 LAB — CBC WITH DIFFERENTIAL/PLATELET
Basophils Absolute: 0 10*3/uL (ref 0.0–0.1)
Basophils Relative: 0.1 % (ref 0.0–3.0)
Eosinophils Absolute: 0 10*3/uL (ref 0.0–0.7)
Eosinophils Relative: 0.7 % (ref 0.0–5.0)
HCT: 39.3 % (ref 36.0–46.0)
Lymphocytes Relative: 16.1 % (ref 12.0–46.0)
Lymphs Abs: 1.1 10*3/uL (ref 0.7–4.0)
Monocytes Relative: 5.6 % (ref 3.0–12.0)
Neutrophils Relative %: 77.5 % — ABNORMAL HIGH (ref 43.0–77.0)
Platelets: 274 10*3/uL (ref 150.0–400.0)
RBC: 4.74 Mil/uL (ref 3.87–5.11)
RDW: 13.9 % (ref 11.5–14.6)
WBC: 6.7 10*3/uL (ref 4.5–10.5)

## 2013-11-01 LAB — AMYLASE: Amylase: 63 U/L (ref 27–131)

## 2013-11-02 ENCOUNTER — Telehealth: Payer: Self-pay | Admitting: Internal Medicine

## 2013-11-02 ENCOUNTER — Ambulatory Visit: Payer: Medicare Other | Admitting: Internal Medicine

## 2013-11-02 NOTE — Telephone Encounter (Signed)
Dr Brodie, do you want to charge late cancellation fee? 

## 2013-11-02 NOTE — Telephone Encounter (Signed)
No charge. 

## 2013-12-03 ENCOUNTER — Encounter: Payer: Self-pay | Admitting: Family Medicine

## 2013-12-03 ENCOUNTER — Ambulatory Visit (INDEPENDENT_AMBULATORY_CARE_PROVIDER_SITE_OTHER): Payer: Medicare Other | Admitting: Family Medicine

## 2013-12-03 VITALS — BP 112/84 | HR 86 | Temp 98.2°F | Resp 16 | Ht 65.0 in | Wt 213.6 lb

## 2013-12-03 DIAGNOSIS — L909 Atrophic disorder of skin, unspecified: Secondary | ICD-10-CM

## 2013-12-03 DIAGNOSIS — Z1159 Encounter for screening for other viral diseases: Secondary | ICD-10-CM

## 2013-12-03 DIAGNOSIS — R635 Abnormal weight gain: Secondary | ICD-10-CM

## 2013-12-03 DIAGNOSIS — R234 Changes in skin texture: Secondary | ICD-10-CM

## 2013-12-03 DIAGNOSIS — R239 Unspecified skin changes: Secondary | ICD-10-CM

## 2013-12-03 DIAGNOSIS — L919 Hypertrophic disorder of the skin, unspecified: Secondary | ICD-10-CM

## 2013-12-03 DIAGNOSIS — M359 Systemic involvement of connective tissue, unspecified: Secondary | ICD-10-CM

## 2013-12-03 LAB — POCT GLYCOSYLATED HEMOGLOBIN (HGB A1C): HEMOGLOBIN A1C: 5.4

## 2013-12-03 NOTE — Progress Notes (Signed)
   Subjective:    Patient ID: Abigail [name], female    DOB: Jul 23, 1961, 53 y.o.   MRN: [id number]  HPI  This 53 y.o. Cauc female has complicated medical hx, problems including Diffuse Disease of Connective Tissue, Obstructive Sleep Apnea, GERD and Diverticular Disease. She has chronic insomnia w/ depression and anxiety. She is currently unemployed and presents today w/ concerns about her weight. She has recently started a fitness program w/ close attention to nutrition and increasing physical activity as tolerated. Weight has increased by 5 lbs since Oct 2015. Pt reports some skin and hair changes. She is concerned about Type II DM.   Review of Systems  Constitutional: Positive for fatigue and unexpected weight change. Negative for fever, chills, diaphoresis and appetite change.  HENT: Negative.   Eyes: Negative.   Respiratory: Negative.   Cardiovascular: Negative.   Gastrointestinal: Positive for diarrhea, constipation and abdominal distention. Negative for nausea, vomiting, abdominal pain and blood in stool.  Endocrine: Positive for cold intolerance.  Musculoskeletal: Positive for arthralgias and myalgias. Negative for back pain, gait problem and joint swelling.  Skin:       Dry skin and thinning hair.  Neurological: Negative.   Psychiatric/Behavioral: Positive for sleep disturbance. Negative for behavioral problems, confusion, dysphoric mood and agitation. The patient is nervous/anxious.        Objective:   Physical Exam  Nursing note and vitals reviewed. Constitutional: She is oriented to person, place, and time. She appears well-developed and well-nourished. No distress.  HENT:  Head: Normocephalic and atraumatic.  Right Ear: External ear normal.  Left Ear: External ear normal.  Nose: Nose normal.  Mouth/Throat: Oropharynx is clear and moist.  Eyes: Conjunctivae and EOM are normal. Pupils are equal, round, and reactive to light. No scleral icterus.  Neck: Normal range of  motion. Neck supple. No thyromegaly present.  Cardiovascular: Normal rate and normal heart sounds.   Pulmonary/Chest: Effort normal and breath sounds normal. No respiratory distress.  Musculoskeletal: Normal range of motion. She exhibits no edema and no tenderness.  Lymphadenopathy:    She has no cervical adenopathy.  Neurological: She is alert and oriented to person, place, and time. No cranial nerve deficit. She exhibits normal muscle tone. Coordination normal.  Skin: Skin is warm and dry. No rash noted. She is not diaphoretic. No erythema. No pallor.  Psychiatric: She has a normal mood and affect. Her behavior is normal. Judgment and thought content normal.   A1c= 5.4%     Assessment & Plan:  Diffuse disease of connective tissue - Follow-up w/ specialist; continue to be active w/ low impact aerobics and chair exercises.  Plan: TSH, T4, Free, T3, Free, Vitamin D, 25-hydroxy  Weight increasing - Plan: TSH, T4, Free, T3, Free, POCT glycosylated hemoglobin (Hb A1C)  Recent skin changes - Plan: TSH, T4, Free, T3, Free  Need for hepatitis C screening test - Plan: Hepatitis C antibody

## 2013-12-04 LAB — T3, FREE: T3, Free: 3.5 pg/mL (ref 2.3–4.2)

## 2013-12-04 LAB — T4, FREE: Free T4: 1.22 ng/dL (ref 0.80–1.80)

## 2013-12-04 LAB — TSH: TSH: 2.037 u[IU]/mL (ref 0.350–4.500)

## 2013-12-04 LAB — HEPATITIS C ANTIBODY: HCV Ab: NEGATIVE

## 2013-12-04 LAB — VITAMIN D 25 HYDROXY (VIT D DEFICIENCY, FRACTURES): Vit D, 25-Hydroxy: 28 ng/mL — ABNORMAL LOW (ref 30–89)

## 2013-12-05 ENCOUNTER — Encounter: Payer: Self-pay | Admitting: Family Medicine

## 2014-01-08 ENCOUNTER — Ambulatory Visit (INDEPENDENT_AMBULATORY_CARE_PROVIDER_SITE_OTHER): Payer: Medicare Other | Admitting: Family Medicine

## 2014-01-08 ENCOUNTER — Ambulatory Visit: Payer: Medicare Other

## 2014-01-08 VITALS — BP 130/90 | HR 83 | Temp 97.6°F | Resp 18 | Ht 65.0 in | Wt 213.1 lb

## 2014-01-08 DIAGNOSIS — M25519 Pain in unspecified shoulder: Secondary | ICD-10-CM

## 2014-01-08 NOTE — Progress Notes (Signed)
This 53 year old woman fell down her steps (12 steps) one week ago. She has persistent right shoulder pain when she abducts the arm. Otherwise she's had no loss of consciousness, no back pain, no abdominal pain, no bleeding, no knee pain.  Objective: No acute distress, patient alert and cooperative with normal demeanor Right shoulder exam: Pain with abduction greater than 30, good range of motion otherwise with normal protraction, internal and external rotation. There is no localized tenderness in the bone appears to be in the normal location.  [facility] ([facility] by  Dr. Joseph Art:  Normal right shoulder..  Assessment: Supraspinatous strain on the right.  Plan: Given range of motion exercises to perform with expectation that this may take 4-6 weeks completely healed. If she's not improving at all and a week, orthopedic or physical therapy referral.

## 2014-01-17 ENCOUNTER — Encounter: Payer: Self-pay | Admitting: Family Medicine

## 2014-01-19 MED ORDER — LORAZEPAM 1 MG PO TABS: 1.0000 mg | ORAL_TABLET | Freq: Two times a day (BID) | ORAL | Status: DC | PRN

## 2014-01-19 NOTE — Telephone Encounter (Signed)
Please fax or call in refill for Lorazepam as approved.

## 2014-03-08 ENCOUNTER — Ambulatory Visit: Payer: Medicare Other | Admitting: Family Medicine

## 2014-03-23 ENCOUNTER — Ambulatory Visit (INDEPENDENT_AMBULATORY_CARE_PROVIDER_SITE_OTHER): Payer: Medicare Other | Admitting: Family Medicine

## 2014-03-23 ENCOUNTER — Encounter: Payer: Self-pay | Admitting: Family Medicine

## 2014-03-23 VITALS — BP 132/92 | HR 84 | Temp 98.2°F | Resp 16 | Ht 64.5 in | Wt 212.8 lb

## 2014-03-23 DIAGNOSIS — M329 Systemic lupus erythematosus, unspecified: Secondary | ICD-10-CM

## 2014-03-23 DIAGNOSIS — E559 Vitamin D deficiency, unspecified: Secondary | ICD-10-CM

## 2014-03-23 DIAGNOSIS — M3500 Sicca syndrome, unspecified: Secondary | ICD-10-CM

## 2014-03-23 DIAGNOSIS — G4700 Insomnia, unspecified: Secondary | ICD-10-CM

## 2014-03-23 DIAGNOSIS — M719 Bursopathy, unspecified: Secondary | ICD-10-CM

## 2014-03-23 DIAGNOSIS — M7581 Other shoulder lesions, right shoulder: Secondary | ICD-10-CM

## 2014-03-23 DIAGNOSIS — G2581 Restless legs syndrome: Secondary | ICD-10-CM

## 2014-03-23 DIAGNOSIS — M67919 Unspecified disorder of synovium and tendon, unspecified shoulder: Secondary | ICD-10-CM

## 2014-03-23 DIAGNOSIS — I10 Essential (primary) hypertension: Secondary | ICD-10-CM

## 2014-03-23 MED ORDER — LOSARTAN POTASSIUM 100 MG PO TABS: 100.0000 mg | ORAL_TABLET | Freq: Every morning | ORAL | Status: DC

## 2014-03-23 NOTE — Progress Notes (Signed)
This chart was scribed for Wardell Honour, MD by Allena Earing, ED Scribe. This patient was seen in room 21 and the patient's care was started at 4:30 PM .  Subjective:    Patient ID: Abigail [name], female    DOB: [birth year], 53 y.o.   MRN: [id number]  HPI  HPI Comments: Abigail [name] is a 53 y.o. female who presents to the [facility] for 4 month f/u hypertension. She reports that she is good and that she has her son's wedding that begins this upcoming Tuesday. Pt needs a refill for her medication. She reports that she is working out regularly, her "weight has not changed though". She reports that her BP is normally lower than it was when it was measured here today.  She takes her estradiol one a week. She takes aderrall when she feels like she needs it.  Pt had blood work 6 weeks ago when she saw Dr. Vernona Rieger of rheumatology.  She was at her gynecologist last year.   The last time she was in here she had just fallen and torn her supraspinatus, she fell down 13 stairs. She does not want to do anything about her shoulder until after the wedding. She does not have a normal ROM and reports pain.  Past Medical History  Diagnosis Date  . Lupus   . Migraine   . Complication of anesthesia     Sjogrens Syndrome  . Sjogren's syndrome   . Vasculitis     "autoimmune"  . Diverticulitis   . Gastritis   . Duodenitis   . Pancreatitis   . Fibromyalgia   . Perforation bowel     "microperforation"  . Asthma   . Melanoma 2008    Back; Tafeen.  . Depression   . GERD (gastroesophageal reflux disease)   . Hypertension   . Urticaria   . Obstructive sleep apnea 12/25/2012  . SLE (systemic lupus erythematosus)      No current outpatient prescriptions on file prior to visit.   No current facility-administered medications on file prior to visit.     Allergies  Allergen Reactions  . Meperidine Hcl Other (See Comments)    tachycardia  . Shrimp [Shellfish Allergy]  Shortness Of Breath  . Doxycycline Hives  . Latex Other (See Comments)    Breathing problems.  . Promethazine Hcl Other (See Comments)    "uncontrolled vomiting"  . Sulfonamide Derivatives Hives  . Tetracyclines & Related Rash     Review of Systems  Constitutional: Negative for fever, chills, diaphoresis, activity change, appetite change and fatigue.  HENT: Negative for nosebleeds, postnasal drip, rhinorrhea, sinus pressure, sneezing, sore throat and tinnitus.   Eyes: Negative for visual disturbance.  Respiratory: Negative for cough, choking and shortness of breath.   Cardiovascular: Negative for chest pain, palpitations and leg swelling.  Gastrointestinal: Negative for nausea, vomiting, abdominal pain, diarrhea and constipation.  Endocrine: Negative for cold intolerance, heat intolerance, polydipsia, polyphagia and polyuria.  Musculoskeletal: Positive for myalgias and arthralgias.       Right shoulder   Skin: Negative for rash.  Neurological: Negative for dizziness, tremors, seizures, syncope, facial asymmetry, speech difficulty, weakness, light-headedness, numbness and headaches.  Psychiatric/Behavioral: Negative for hallucinations, behavioral problems, confusion, dysphoric mood, decreased concentration and agitation. The patient is not nervous/anxious.        Objective:   Physical Exam  Constitutional: She is oriented to person, place, and time. She appears well-developed and well-nourished. No distress.  HENT:  Head: Normocephalic and atraumatic.  Right Ear: External ear normal.  Left Ear: External ear normal.  Nose: Nose normal.  Mouth/Throat: Oropharynx is clear and moist.  Eyes: Conjunctivae and EOM are normal. Pupils are equal, round, and reactive to light. No scleral icterus.  Neck: Normal range of motion. Neck supple. Carotid bruit is not present. No thyromegaly present.  Cardiovascular: Normal rate, regular rhythm, normal heart sounds and intact distal pulses.  Exam  reveals no gallop and no friction rub.   No murmur heard. Pulmonary/Chest: Effort normal and breath sounds normal. No stridor. She has no wheezes. She has no rales. She exhibits no tenderness.  Abdominal: Soft. Bowel sounds are normal. She exhibits no distension and no mass. There is no tenderness. There is no rebound and no guarding.  Musculoskeletal: She exhibits no edema.       Right shoulder: She exhibits decreased range of motion, pain and decreased strength. She exhibits normal pulse.  Lymphadenopathy:    She has no cervical adenopathy.  Neurological: She is alert and oriented to person, place, and time. No cranial nerve deficit. She exhibits normal muscle tone. Coordination normal.  Skin: Skin is warm and dry. No rash noted. She is not diaphoretic. No erythema. No pallor.  Psychiatric: She has a normal mood and affect. Her behavior is normal.  Nursing note and vitals reviewed.    Filed Vitals:   03/23/14 1628  BP: 132/92  Pulse: 84  Temp: 98.2 F (36.8 C)  Resp: 16        Assessment & Plan:  Essential hypertension, benign - Plan: CANCELED: CBC with Differential, CANCELED: COMPLETE METABOLIC PANEL WITH GFR, DISCONTINUED: losartan (COZAAR) 100 MG tablet, DISCONTINUED: losartan (COZAAR) 100 MG tablet  Sjogren's disease - Plan: CANCELED: Sedimentation Rate  Insomnia  SLE (systemic lupus erythematosus)  Right rotator cuff tendonitis  Restless leg syndrome  1. Hypertension: controlled; recent labs obtained; refill provided. 2.  Sjogren's disease: stable; followed by rheumatology. 3.  Insomnia: stable; continue current medications.  Likely worsening due to R rotator cuff pathology and upcoming wedding. 4.  SLE: stable; recent follow-up with rheumatology.  No changes to management. 5.  R rotator cuff tendonitis: persistent; does not desire ortho evaluation until after son's wedding and agreeable with plan. 6. RLS: stable.     Meds ordered this encounter  Medications  .  DISCONTD: losartan (COZAAR) 100 MG tablet    Sig: Take 1 tablet (100 mg total) by mouth every morning.    Dispense:  90 tablet    Refill:  1  . DISCONTD: losartan (COZAAR) 100 MG tablet    Sig: Take 1 tablet (100 mg total) by mouth every morning.    Dispense:  90 tablet    Refill:  3     4:54 PM-Discussed treatment plan which includes a f/u in 7 months with pt at bedside and pt agreed to plan.   I personally performed the services described in this documentation, which was scribed in my presence. The recorded information has been reviewed and is accurate.  Reginia Forts, M.D.  [facility] [address] [place], [place]  [address] [phone] phone [phone] fax

## 2014-03-23 NOTE — Patient Instructions (Signed)

## 2014-04-07 ENCOUNTER — Encounter: Payer: Self-pay | Admitting: Family Medicine

## 2014-04-07 MED ORDER — FLUCONAZOLE 100 MG PO TABS: ORAL_TABLET | ORAL | Status: DC

## 2014-04-07 NOTE — Telephone Encounter (Signed)
Please someone try to do this for the patient.  [phone]

## 2014-04-11 ENCOUNTER — Ambulatory Visit: Payer: Medicare Other

## 2014-04-11 ENCOUNTER — Encounter: Payer: Self-pay | Admitting: *Deleted

## 2014-04-11 ENCOUNTER — Ambulatory Visit (INDEPENDENT_AMBULATORY_CARE_PROVIDER_SITE_OTHER): Payer: Medicare Other | Admitting: Internal Medicine

## 2014-04-11 VITALS — BP 110/80 | HR 90 | Temp 97.9°F | Resp 16 | Ht 65.25 in | Wt 207.4 lb

## 2014-04-11 DIAGNOSIS — Z79899 Other long term (current) drug therapy: Secondary | ICD-10-CM

## 2014-04-11 DIAGNOSIS — J329 Chronic sinusitis, unspecified: Secondary | ICD-10-CM

## 2014-04-11 DIAGNOSIS — R5383 Other fatigue: Secondary | ICD-10-CM

## 2014-04-11 DIAGNOSIS — B370 Candidal stomatitis: Secondary | ICD-10-CM

## 2014-04-11 DIAGNOSIS — R61 Generalized hyperhidrosis: Secondary | ICD-10-CM

## 2014-04-11 DIAGNOSIS — B9689 Other specified bacterial agents as the cause of diseases classified elsewhere: Secondary | ICD-10-CM

## 2014-04-11 DIAGNOSIS — R5381 Other malaise: Secondary | ICD-10-CM

## 2014-04-11 DIAGNOSIS — M329 Systemic lupus erythematosus, unspecified: Secondary | ICD-10-CM

## 2014-04-11 LAB — POCT CBC
Granulocyte percent: 54.5 %G (ref 37–80)
HCT, POC: 44.6 % (ref 37.7–47.9)
HEMOGLOBIN: 14.3 g/dL (ref 12.2–16.2)
Lymph, poc: 1.3 (ref 0.6–3.4)
MCH, POC: 27.7 pg (ref 27–31.2)
MCHC: 32.1 g/dL (ref 31.8–35.4)
MCV: 86.2 fL (ref 80–97)
MID (CBC): 0.4 (ref 0–0.9)
MPV: 12 fL (ref 0–99.8)
PLATELET COUNT, POC: 233 10*3/uL (ref 142–424)
POC GRANULOCYTE: 2 (ref 2–6.9)
POC LYMPH PERCENT: 35 %L (ref 10–50)
POC MID %: 10.5 %M (ref 0–12)
RBC: 5.17 M/uL (ref 4.04–5.48)
RDW, POC: 15 %
WBC: 3.7 10*3/uL — AB (ref 4.6–10.2)

## 2014-04-11 LAB — POCT URINALYSIS DIPSTICK
Bilirubin, UA: NEGATIVE
Blood, UA: NEGATIVE
Glucose, UA: NEGATIVE
KETONES UA: NEGATIVE
Leukocytes, UA: NEGATIVE
NITRITE UA: NEGATIVE
PROTEIN UA: NEGATIVE
Spec Grav, UA: 1.015
Urobilinogen, UA: 0.2
pH, UA: 7

## 2014-04-11 LAB — COMPREHENSIVE METABOLIC PANEL
ALK PHOS: 72 U/L (ref 39–117)
ALT: 29 U/L (ref 0–35)
AST: 28 U/L (ref 0–37)
Albumin: 4.1 g/dL (ref 3.5–5.2)
BILIRUBIN TOTAL: 0.4 mg/dL (ref 0.2–1.2)
BUN: 13 mg/dL (ref 6–23)
CO2: 23 mEq/L (ref 19–32)
CREATININE: 0.82 mg/dL (ref 0.50–1.10)
Calcium: 9.3 mg/dL (ref 8.4–10.5)
Chloride: 107 mEq/L (ref 96–112)
Glucose, Bld: 93 mg/dL (ref 70–99)
Potassium: 4.4 mEq/L (ref 3.5–5.3)
SODIUM: 139 meq/L (ref 135–145)
Total Protein: 7.5 g/dL (ref 6.0–8.3)

## 2014-04-11 LAB — POCT UA - MICROSCOPIC ONLY
Bacteria, U Microscopic: NEGATIVE
CASTS, UR, LPF, POC: NEGATIVE
Crystals, Ur, HPF, POC: NEGATIVE
Epithelial cells, urine per micros: NEGATIVE
Mucus, UA: NEGATIVE
Yeast, UA: NEGATIVE

## 2014-04-11 LAB — GLUCOSE, POCT (MANUAL RESULT ENTRY): POC Glucose: 86 mg/dl (ref 70–99)

## 2014-04-11 LAB — POCT SEDIMENTATION RATE: POCT SED RATE: 20 mm/h (ref 0–22)

## 2014-04-11 LAB — LIPASE: Lipase: 55 U/L (ref 0–75)

## 2014-04-11 LAB — C-REACTIVE PROTEIN: CRP: <0.5 mg/dL (ref <0.60)

## 2014-04-11 MED ORDER — AMOXICILLIN 500 MG PO CAPS: 1000.0000 mg | ORAL_CAPSULE | Freq: Two times a day (BID) | ORAL | Status: DC

## 2014-04-11 NOTE — Progress Notes (Signed)
   Subjective:    Patient ID: Abigail [name], female    DOB: December 03, 1960, 53 y.o.   MRN: [id number]  HPI    Review of Systems     Objective:   Physical Exam        Assessment & Plan:

## 2014-04-11 NOTE — Patient Instructions (Signed)
Diaphoresis  Sweating is controlled by our nervous system. Sweat glands are found in the skin throughout the body. They exist in higher numbers in the skin of the hands, feet, armpits, and the genital region. Sweating occurs normally when the temperature of your body goes up. Diaphoresis means profuse sweating or perspiration due to an underlying medical condition or an external factor (such as medicines). Hyperhidrosis means excessive sweating that is not usually due to an underlying medical condition, on areas such as the palms, soles, or armpits. Other areas of the body may also be affected. Hyperhidrosis usually begins in childhood or early adolescence. It increases in severity through puberty and into adulthood. Sweaty palms are the most common problem and the most bothersome to people who have hyperhidrosis.  CAUSES   Sweating is normally seen with exercise or being in hot surroundings. Not sweating in these conditions would be harmful. Stressful situations can also cause sweating. In some people, stimulation of the sweat glands during stress is overactive. Talking to strangers or shaking someone's hand can produce profuse sweating. Causes of sweating include:  · Emotional upset.  · Low blood pressure.  · Low blood sugar.  · Heart problems.  · Low blood cell counts.  · Certain pain relieving medicines.  · Exercise.  · Alcohol.  · Infection.  · Caffeine.  · Spicy foods.  · Hot flashes.  · Overactive thyroid.  · Illegal drug use, such as cocaine and amphetamine.  · Use of medicines that stimulate parts of the nervous system.  · A tumor (pheochromocytoma).  · Withdrawal from some medicines or alcohol.  DIAGNOSIS   Your caregiver needs to be consulted to make sure excessive sweating is not caused by another condition. Further testing may need to be done.  TREATMENT   · When hyperhidrosis is caused by another condition, that condition should be treated.  · If menopause is the cause, you may wish to talk to your  caregiver about estrogen replacement.  · If the hyperhidrosis is a natural happening of the way your body works, certain antiperspirants may help.  · If medicines do not work, injections of botulinum toxin type A are sometimes used for underarm sweating.  · Your caregiver can usually help you with this problem.  Document Released: 05/20/2005 Document Revised: 01/20/2012 Document Reviewed: 12/05/2008  [employer]® Patient Information ©2014 [employer].

## 2014-04-11 NOTE — Progress Notes (Signed)
Subjective:    Patient ID: Abigail [name], female    DOB: [birth year], 53 y.o.   MRN: [id number]  HPI 53 year old female complains of chills and night sweats. Coughing this morning with fatigue.  No sinus or head cold symptoms. She also had thrush a couple of days ago. Frequent stools yesterday but no diarrhea. She has been sick for a week. 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 Remer Macho her Rheum. She has weaned herself off estrogen recently. Has extreme fatigue. No urinary sxs, some loose stools.    Review of Systems complex    Objective:   Physical Exam  Constitutional: She is oriented to person, place, and time. She appears well-developed and well-nourished.  HENT:  Head: Normocephalic.  Right Ear: External ear normal.  Left Ear: External ear normal.  Nose: Mucosal edema and rhinorrhea present. Right sinus exhibits no maxillary sinus tenderness and no frontal sinus tenderness. Left sinus exhibits no maxillary sinus tenderness and no frontal sinus tenderness.  Mouth/Throat: Oropharynx is clear and moist.  Cardiovascular: Normal rate and normal heart sounds.   Pulmonary/Chest: Effort normal and breath sounds normal. She has no wheezes. She exhibits no tenderness.  Abdominal: Soft. She exhibits no mass. There is tenderness. There is no guarding.  Musculoskeletal: Normal range of motion.  Neurological: She is alert and oriented to person, place, and time. No cranial nerve deficit. She exhibits normal muscle tone. Coordination normal.  Skin: No rash noted. She is diaphoretic.  Psychiatric: She has a normal mood and affect. Her behavior is normal. Thought content normal.     [facility] reading (PRIMARY) by  Dr Elder Cyphers mild diffuse sinus disease  Results for orders placed in visit on 04/11/14  POCT CBC      Result Value Ref Range   WBC 3.7 (*) 4.6 - 10.2 K/uL   Lymph, poc 1.3  0.6 -  3.4   POC LYMPH PERCENT 35.0  10 - 50 %L   MID (cbc) 0.4  0 - 0.9   POC MID % 10.5  0 - 12 %M   POC Granulocyte 2.0  2 - 6.9   Granulocyte percent 54.5  37 - 80 %G   RBC 5.17  4.04 - 5.48 M/uL   Hemoglobin 14.3  12.2 - 16.2 g/dL   HCT, POC 44.6  37.7 - 47.9 %   MCV 86.2  80 - 97 fL   MCH, POC 27.7  27 - 31.2 pg   MCHC 32.1  31.8 - 35.4 g/dL   RDW, POC 15.0     Platelet Count, POC 233  142 - 424 K/uL   MPV 12.0  0 - 99.8 fL  GLUCOSE, POCT (MANUAL RESULT ENTRY)      Result Value Ref Range   POC Glucose 86  70 - 99 mg/dl  POCT UA - MICROSCOPIC ONLY      Result Value Ref Range   WBC, Ur, HPF, POC 0-1     RBC, urine, microscopic 0-2     Bacteria, U Microscopic neg     Mucus, UA neg     Epithelial cells, urine per micros neg     Crystals, Ur, HPF, POC neg     Casts, Ur, LPF, POC neg     Yeast, UA neg    POCT URINALYSIS DIPSTICK      Result Value Ref Range  Color, UA yellow     Clarity, UA clear     Glucose, UA neg     Bilirubin, UA neg     Ketones, UA neg     Spec Grav, UA 1.015     Blood, UA neg     pH, UA 7.0     Protein, UA neg     Urobilinogen, UA 0.2     Nitrite, UA neg     Leukocytes, UA Negative            Assessment & Plan:  Diaphoresis/Fatigue/SLE/Purulent left rhinitis Cause unclear/Possible sinusitis Amoxil 1g bid/continue diflucan for thrush reduce to qod

## 2014-04-13 LAB — URINE CULTURE: Colony Count: 30000

## 2014-04-21 LAB — HM MAMMOGRAPHY

## 2014-04-26 ENCOUNTER — Ambulatory Visit
Admission: RE | Admit: 2014-04-26 | Discharge: 2014-04-26 | Disposition: A | Discharge status: Home / Self Care | Payer: Medicare Other | Source: Ambulatory Visit | Attending: Internal Medicine | Admitting: Internal Medicine

## 2014-04-26 ENCOUNTER — Ambulatory Visit (INDEPENDENT_AMBULATORY_CARE_PROVIDER_SITE_OTHER): Payer: Medicare Other | Admitting: Internal Medicine

## 2014-04-26 ENCOUNTER — Encounter: Payer: Self-pay | Admitting: Internal Medicine

## 2014-04-26 VITALS — BP 134/88 | HR 89 | Temp 98.1°F | Resp 18 | Ht 65.25 in | Wt 210.0 lb

## 2014-04-26 DIAGNOSIS — H7090 Unspecified mastoiditis, unspecified ear: Secondary | ICD-10-CM

## 2014-04-26 DIAGNOSIS — J019 Acute sinusitis, unspecified: Secondary | ICD-10-CM

## 2014-04-26 DIAGNOSIS — M329 Systemic lupus erythematosus, unspecified: Secondary | ICD-10-CM

## 2014-04-26 LAB — POCT CBC
GRANULOCYTE PERCENT: 59.4 % (ref 37–80)
HEMATOCRIT: 42 % (ref 37.7–47.9)
Hemoglobin: 14 g/dL (ref 12.2–16.2)
LYMPH, POC: 1.4 (ref 0.6–3.4)
MCH, POC: 28.3 pg (ref 27–31.2)
MCHC: 33.3 g/dL (ref 31.8–35.4)
MCV: 85.1 fL (ref 80–97)
MID (CBC): 0.3 (ref 0–0.9)
MPV: 10.8 fL (ref 0–99.8)
POC GRANULOCYTE: 2.5 (ref 2–6.9)
POC LYMPH %: 33.2 % (ref 10–50)
POC MID %: 7.4 % (ref 0–12)
Platelet Count, POC: 245 10*3/uL (ref 142–424)
RBC: 4.94 M/uL (ref 4.04–5.48)
RDW, POC: 14.9 %
WBC: 4.2 10*3/uL — AB (ref 4.6–10.2)

## 2014-04-26 LAB — POCT SEDIMENTATION RATE: POCT SED RATE: 20 mm/hr (ref 0–22)

## 2014-04-26 MED ORDER — CEFTRIAXONE SODIUM 1 G IJ SOLR
500.0000 mg | Freq: Once | INTRAMUSCULAR | Status: AC
  Administered 2014-04-26: 500 mg via INTRAMUSCULAR

## 2014-04-26 MED ORDER — LEVOFLOXACIN 750 MG PO TABS
750.0000 mg | ORAL_TABLET | Freq: Every day | ORAL | Status: DC
Start: 2014-04-26 — End: 2014-05-31

## 2014-04-26 NOTE — Progress Notes (Signed)
   Subjective:    Patient ID: Abigail [name], female    DOB: [birth year], 53 y.o.   MRN: [id number]  HPI Is sick left sinus, nasal, with bloody discharge and thick con gestion. No fever but has SLE and is immunosuppresed altho not on treatment at this time. Sinus xrs recently showed possible left mastoid infection, but questionable.She is red over both mastoids but not tender.   Review of Systems     Objective:   Physical Exam  Constitutional: She is oriented to person, place, and time. She appears well-developed and well-nourished. No distress.  HENT:  Head: Normocephalic. Head is with laceration. Head is without Battle's sign and without abrasion.    Right Ear: Hearing, tympanic membrane, external ear and ear canal normal. No mastoid tenderness.  Left Ear: Hearing, external ear and ear canal normal. No mastoid tenderness. Tympanic membrane is retracted. Tympanic membrane is not erythematous and not bulging.  Nose: Mucosal edema, rhinorrhea and sinus tenderness present. Left sinus exhibits maxillary sinus tenderness and frontal sinus tenderness.  Mouth/Throat: Oropharynx is clear and moist.  Rash not tender either side.  Eyes: EOM are normal. Pupils are equal, round, and reactive to light.  Neck: Normal range of motion. Neck supple.  Pulmonary/Chest: Effort normal.  Musculoskeletal: Normal range of motion.  Lymphadenopathy:    She has no cervical adenopathy.  Neurological: She is alert and oriented to person, place, and time. No cranial nerve deficit. She exhibits normal muscle tone. Coordination normal.  Skin: Rash noted.  Psychiatric: She has a normal mood and affect. Her behavior is normal. Thought content normal.    Results for orders placed in visit on 04/26/14  POCT CBC      Result Value Ref Range   WBC 4.2 (*) 4.6 - 10.2 K/uL   Lymph, poc 1.4  0.6 - 3.4   POC LYMPH PERCENT 33.2  10 - 50 %L   MID (cbc) 0.3  0 - 0.9   POC MID % 7.4  0 - 12 %M   POC Granulocyte 2.5  2 -  6.9   Granulocyte percent 59.4  37 - 80 %G   RBC 4.94  4.04 - 5.48 M/uL   Hemoglobin 14.0  12.2 - 16.2 g/dL   HCT, POC 42.0  37.7 - 47.9 %   MCV 85.1  80 - 97 fL   MCH, POC 28.3  27 - 31.2 pg   MCHC 33.3  31.8 - 35.4 g/dL   RDW, POC 14.9     Platelet Count, POC 245  142 - 424 K/uL   MPV 10.8  0 - 99.8 fL         Assessment & Plan:  CT scan sinuses and mastoids call report Rocephin 1g/Levaquin 750mg  7d

## 2014-04-26 NOTE — Patient Instructions (Signed)
Sinusitis Sinusitis is redness, soreness, and swelling (inflammation) of the paranasal sinuses. Paranasal sinuses are air pockets within the bones of your face (beneath the eyes, the middle of the forehead, or above the eyes). In healthy paranasal sinuses, mucus is able to drain out, and air is able to circulate through them by way of your nose. However, when your paranasal sinuses are inflamed, mucus and air can become trapped. This can allow bacteria and other germs to grow and cause infection. Sinusitis can develop quickly and last only a short time (acute) or continue over a long period (chronic). Sinusitis that lasts for more than 12 weeks is considered chronic.  CAUSES  Causes of sinusitis include:  Allergies.  Structural abnormalities, such as displacement of the cartilage that separates your nostrils (deviated septum), which can decrease the air flow through your nose and sinuses and affect sinus drainage.  Functional abnormalities, such as when the small hairs (cilia) that line your sinuses and help remove mucus do not work properly or are not present. SYMPTOMS  Symptoms of acute and chronic sinusitis are the same. The primary symptoms are pain and pressure around the affected sinuses. Other symptoms include:  Upper toothache.  Earache.  Headache.  Bad breath.  Decreased sense of smell and taste.  A cough, which worsens when you are lying flat.  Fatigue.  Fever.  Thick drainage from your nose, which often is green and may contain pus (purulent).  Swelling and warmth over the affected sinuses. DIAGNOSIS  Your caregiver will perform a physical exam. During the exam, your caregiver may:  Look in your nose for signs of abnormal growths in your nostrils (nasal polyps).  Tap over the affected sinus to check for signs of infection.  View the inside of your sinuses (endoscopy) with a special imaging device with a light attached (endoscope), which is inserted into your  sinuses. If your caregiver suspects that you have chronic sinusitis, one or more of the following tests may be recommended:  Allergy tests.  Nasal culture A sample of mucus is taken from your nose and sent to a lab and screened for bacteria.  Nasal cytology A sample of mucus is taken from your nose and examined by your caregiver to determine if your sinusitis is related to an allergy. TREATMENT  Most cases of acute sinusitis are related to a viral infection and will resolve on their own within 10 days. Sometimes medicines are prescribed to help relieve symptoms (pain medicine, decongestants, nasal steroid sprays, or saline sprays).  However, for sinusitis related to a bacterial infection, your caregiver will prescribe antibiotic medicines. These are medicines that will help kill the bacteria causing the infection.  Rarely, sinusitis is caused by a fungal infection. In theses cases, your caregiver will prescribe antifungal medicine. For some cases of chronic sinusitis, surgery is needed. Generally, these are cases in which sinusitis recurs more than 3 times per year, despite other treatments. HOME CARE INSTRUCTIONS   Drink plenty of water. Water helps thin the mucus so your sinuses can drain more easily.  Use a humidifier.  Inhale steam 3 to 4 times a day (for example, sit in the bathroom with the shower running).  Apply a warm, moist washcloth to your face 3 to 4 times a day, or as directed by your caregiver.  Use saline nasal sprays to help moisten and clean your sinuses.  Take over-the-counter or prescription medicines for pain, discomfort, or fever only as directed by your caregiver. SEEK IMMEDIATE MEDICAL   CARE IF:  You have increasing pain or severe headaches.  You have nausea, vomiting, or drowsiness.  You have swelling around your face.  You have vision problems.  You have a stiff neck.  You have difficulty breathing. MAKE SURE YOU:   Understand these  instructions.  Will watch your condition.  Will get help right away if you are not doing well or get worse. Document Released: 10/28/2005 Document Revised: 01/20/2012 Document Reviewed: 11/12/2011 [facility] Patient Information 2014 [place], [place]. Mastoiditis Mastoiditis is an infection that has spread from the middle ear to a bony area (the mastoid air cells) behind the middle ear. It is an uncommon complication of a middle ear infection. It occurs most often in young children. Treatment with the right antibiotics (medications that are used to treat bacteria germs) is generally effective. With the right treatment, there is a very high chance of full recovery.  SYMPTOMS  Some common symptoms of mastoiditis include:   Pain, swelling, redness, warmth, or a tender mass of the bone behind the ear.  Fever.  Fussiness and irritability.  Redness and swelling of the ear lobe or ear.  Ear drainage.  Headache. DIAGNOSIS  Your caregiver will make the diagnosis based on an exam and on questions about what you or your child has been feeling. Other tests that may be done include:  Blood work.  Blood cultures or cultures of ear drainage.  X-rays.  If you or your child has symptoms that suggest more serious problems, additional tests and/or specialized x-rays may be done. These could include:  CT (CAT) scans.  Magnetic Resonance Imaging (MRI). TREATMENT  Treatment will be based on how serious the infection is and what will be expected to give the best outcome. The treatment required may be:  Hospitalization and antibiotics given through a vein.  An operation (myringotomy) is sometimes done to relieve the pressure from the middle ear. This is a surgical procedure where a small hole is cut into the ear drum. A small tube is then placed to keep the hole open and draining. The tubes usually fall out on their own after 6 to 12 months.  In rare cases, if the above treatments do not work, more  surgery may be necessary. This is called a mastoidectomy. RISKS AND COMPLICATIONS These are rare if proper treatment is started early. These can include:  Facial paralysis  Infection and possible destruction of the mastoid bone.  Spread of infection to the neck.  Hearing loss which can be partial or complete on the side of the infection.  Infection spread to the brain.  Clots or blockage of blood vessels in the neck or brain. HOME CARE INSTRUCTIONS   Take prescribed antibiotics and other medications as directed by your caregiver.  Follow-up with an exam by an ENT (Ear, Nose, and Throat) specialist if recommended.  A follow-up hearing test (audiogram) may be recommended. SEEK MEDICAL CARE IF:   You develop recurrent fevers of 100 F (37.8 C) or higher.  You develop new headache, ear, or facial pain that had not happened before.  You feel that there has been loss of hearing. SEEK IMMEDIATE MEDICAL CARE IF:   You develop fevers of 102 F (38.9 C).  You develop severe headache, ear, or facial pain.  You experience sudden hearing loss.  You develop repeated episodes of vomiting.  You develop weakness or drooping of one side of your face.  You experience weakness of one arm, one leg, or on one side  of your body.  You develop sudden problems with speech and/or vision. MAKE SURE YOU:   Understand these instructions.  Will watch your condition.  Will get help right away if you are not doing well or get worse. Document Released: 11/27/2006 Document Revised: 01/20/2012 Document Reviewed: 10/16/2007 [facility] Patient Information 2014 [place].

## 2014-04-29 ENCOUNTER — Ambulatory Visit (INDEPENDENT_AMBULATORY_CARE_PROVIDER_SITE_OTHER): Payer: Medicare Other | Admitting: Internal Medicine

## 2014-04-29 VITALS — BP 122/74 | HR 88 | Temp 97.9°F | Resp 16 | Wt 210.6 lb

## 2014-04-29 DIAGNOSIS — J9801 Acute bronchospasm: Secondary | ICD-10-CM

## 2014-04-29 DIAGNOSIS — Z79899 Other long term (current) drug therapy: Secondary | ICD-10-CM

## 2014-04-29 DIAGNOSIS — R61 Generalized hyperhidrosis: Secondary | ICD-10-CM

## 2014-04-29 DIAGNOSIS — G4700 Insomnia, unspecified: Secondary | ICD-10-CM

## 2014-04-29 DIAGNOSIS — IMO0002 Reserved for concepts with insufficient information to code with codable children: Secondary | ICD-10-CM

## 2014-04-29 DIAGNOSIS — B9689 Other specified bacterial agents as the cause of diseases classified elsewhere: Secondary | ICD-10-CM

## 2014-04-29 DIAGNOSIS — R5383 Other fatigue: Secondary | ICD-10-CM

## 2014-04-29 DIAGNOSIS — M329 Systemic lupus erythematosus, unspecified: Secondary | ICD-10-CM

## 2014-04-29 DIAGNOSIS — R5381 Other malaise: Secondary | ICD-10-CM

## 2014-04-29 DIAGNOSIS — J329 Chronic sinusitis, unspecified: Secondary | ICD-10-CM

## 2014-04-29 DIAGNOSIS — A499 Bacterial infection, unspecified: Secondary | ICD-10-CM

## 2014-04-29 MED ORDER — CEFTRIAXONE SODIUM 1 G IJ SOLR
500.0000 mg | Freq: Once | INTRAMUSCULAR | Status: AC
  Administered 2014-04-29: 500 mg via INTRAMUSCULAR

## 2014-04-29 MED ORDER — ALBUTEROL SULFATE HFA 108 (90 BASE) MCG/ACT IN AERS: 2.0000 | INHALATION_SPRAY | Freq: Four times a day (QID) | RESPIRATORY_TRACT | Status: DC | PRN

## 2014-04-29 MED ORDER — FLUTICASONE PROPIONATE 50 MCG/ACT NA SUSP
2.0000 | Freq: Every day | NASAL | Status: DC
Start: 2014-04-29 — End: 2015-02-10

## 2014-04-29 MED ORDER — ZOLPIDEM TARTRATE 10 MG PO TABS: 10.0000 mg | ORAL_TABLET | Freq: Every evening | ORAL | Status: DC | PRN

## 2014-04-29 NOTE — Patient Instructions (Signed)
Immunization Schedule, Adult  Influenza vaccine.  All adults should be immunized every year.  All adults, including pregnant women and people with hives-only allergy to eggs can receive the inactivated influenza (IIV) vaccine.  Adults aged 54-49 years can receive the recombinant influenza (RIV) vaccine. The RIV vaccine does not contain any egg protein.  Adults aged 76 years or older can receive the standard-dose IIV or the high-dose IIV.  Tetanus, diphtheria, and acellular pertussis (Td, Tdap) vaccine.  Pregnant women should receive 1 dose of Tdap vaccine during each pregnancy. The dose should be obtained regardless of the length of time since the last dose. Immunization is preferred during the 27th to 36th week of gestation.  An adult who has not previously received Tdap or who does not know his or her vaccine status should receive 1 dose of Tdap. This initial dose should be followed by tetanus and diphtheria toxoids (Td) booster doses every 10 years.  Adults with an unknown or incomplete history of completing a 3-dose immunization series with Td-containing vaccines should begin or complete a primary immunization series including a Tdap dose.  Adults should receive a Td booster every 10 years.  Varicella vaccine.  An adult without evidence of immunity to varicella should receive 2 doses or a second dose if he or she has previously received 1 dose.  Pregnant females who do not have evidence of immunity should receive the first dose after pregnancy. This first dose should be obtained before leaving the health care facility. The second dose should be obtained 4-8 weeks after the first dose.  Human papillomavirus (HPV) vaccine.  Females aged 13-26 years who have not received the vaccine previously should obtain the 3-dose series.  The vaccine is not recommended for use in pregnant females. However, pregnancy testing is not needed before receiving a dose. If a female is found to be  pregnant after receiving a dose, no treatment is needed. In that case, the remaining doses should be delayed until after the pregnancy.  Males aged 37-21 years who have not received the vaccine previously should receive the 3-dose series. Males aged 22-26 years may be immunized.  Immunization is recommended through the age of 93 years for any female who has sex with males and did not get any or all doses earlier.  Immunization is recommended for any person with an immunocompromised condition through the age of 71 years if he or she did not get any or all doses earlier.  During the 3-dose series, the second dose should be obtained 4-8 weeks after the first dose. The third dose should be obtained 24 weeks after the first dose and 16 weeks after the second dose.  Zoster vaccine.  One dose is recommended for adults aged 73 years or older unless certain conditions are present.  Measles, mumps, and rubella (MMR) vaccine.  Adults born before 35 generally are considered immune to measles and mumps.  Adults born in [birth year] or later should have 1 or more doses of MMR vaccine unless there is a contraindication to the vaccine or there is laboratory evidence of immunity to each of the three diseases.  A routine second dose of MMR vaccine should be obtained at least 28 days after the first dose for students attending postsecondary schools, health care workers, or international travelers.  People who received inactivated measles vaccine or an unknown type of measles vaccine during 1963-1967 should receive 2 doses of MMR vaccine.  People who received inactivated mumps vaccine or an unknown type  of mumps vaccine before 1979 and are at high risk for mumps infection should consider immunization with 2 doses of MMR vaccine.  For females of childbearing age, rubella immunity should be determined. If there is no evidence of immunity, females who are not pregnant should be vaccinated. If there is no evidence of  immunity, females who are pregnant should delay immunization until after pregnancy.  Unvaccinated health care workers born before 1957 who lack laboratory evidence of measles, mumps, or rubella immunity or laboratory confirmation of disease should consider measles and mumps immunization with 2 doses of MMR vaccine or rubella immunization with 1 dose of MMR vaccine.  Pneumococcal 13-valent conjugate (PCV13) vaccine.  When indicated, a person who is uncertain of his or her immunization history and has no record of immunization should receive the PCV13 vaccine.  An adult aged 19 years or older who has certain medical conditions and has not been previously immunized should receive 1 dose of PCV13 vaccine. This PCV13 should be followed with a dose of pneumococcal polysaccharide (PPSV23) vaccine. The PPSV23 vaccine dose should be obtained at least 8 weeks after the dose of PCV13 vaccine.  An adult aged 19 years or older who has certain medical conditions and previously received 1 or more doses of PPSV23 vaccine should receive 1 dose of PCV13. The PCV13 vaccine dose should be obtained 1 or more years after the last PPSV23 vaccine dose.  Pneumococcal polysaccharide (PPSV23) vaccine.  When PCV13 is also indicated, PCV13 should be obtained first.  All adults aged 65 years and older should be immunized.  An adult younger than age 65 years who has certain medical conditions should be immunized.  Any person who resides in a nursing home or long-term care facility should be immunized.  An adult smoker should be immunized.  People with an immunocompromised condition and certain other conditions should receive both PCV13 and PPSV23 vaccines.  People with human immunodeficiency virus (HIV) infection should be immunized as soon as possible after diagnosis.  Immunization during chemotherapy or radiation therapy should be avoided.  Routine use of PPSV23 vaccine is not recommended for American Indians,  [place]atives, or people younger than 65 years unless there are medical conditions that require PPSV23 vaccine.  When indicated, people who have unknown immunization and have no record of immunization should receive PPSV23 vaccine.  One-time revaccination 5 years after the first dose of PPSV23 is recommended for people aged 19-64 years who have chronic kidney failure, nephrotic syndrome, asplenia, or immunocompromised conditions.  People who received 1-2 doses of PPSV23 before age 65 years should receive another dose of PPSV23 vaccine at age 65 years or later if at least 5 years have passed since the previous dose.  Doses of PPSV23 are not needed for people immunized with PPSV23 at or after age 65 years.  Meningococcal vaccine.  Adults with asplenia or persistent complement component deficiencies should receive 2 doses of quadrivalent meningococcal conjugate (MenACWY-D) vaccine. The doses should be obtained at least 2 months apart.  Microbiologists working with certain meningococcal bacteria, military recruits, people at risk during an outbreak, and people who travel to or live in countries with a high rate of meningitis should be immunized.  A first-year college student up through age 21 years who is living in a residence hall should receive a dose if he or she did not receive a dose on or after his or her 16th birthday.  Adults who have certain high-risk conditions should receive one or more doses   of vaccine.  Hepatitis A vaccine.  Adults who wish to be protected from this disease, have certain high-risk conditions, work with hepatitis A-infected animals, work in hepatitis A research labs, or travel to or work in countries with a high rate of hepatitis A should be immunized.  Adults who were previously unvaccinated and who anticipate close contact with an international adoptee during the first 60 days after arrival in the [place] from a country with a high rate of hepatitis A should  be immunized.  Hepatitis B vaccine.  Adults who wish to be protected from this disease, have certain high-risk conditions, may be exposed to blood or other infectious body fluids, are household contacts or sex partners of hepatitis B positive people, are clients or workers in certain care facilities, or travel to or work in countries with a high rate of hepatitis B should be immunized.  Haemophilus influenzae type b (Hib) vaccine.  A previously unvaccinated person with asplenia or sickle cell disease or having a scheduled splenectomy should receive 1 dose of Hib vaccine.  Regardless of previous immunization, a recipient of a hematopoietic stem cell transplant should receive a 3-dose series 6-12 months after his or her successful transplant.  Hib vaccine is not recommended for adults with HIV infection. Document Released: 01/18/2004 Document Revised: 02/22/2013 Document Reviewed: 12/15/2012 [employer] Patient Information 2015 [employer]. This information is not intended to replace advice given to you by your health care provider. Make sure you discuss any questions you have with your health care provider.  

## 2014-04-29 NOTE — Progress Notes (Signed)
   Subjective:    Patient ID: Abigail [name], female    DOB: 1961/02/13, 53 y.o.   MRN: [id number]  HPI Abigail [name] presents to the clinic today to follow up on her infection which she was seen for 04/26/14. She states the 1 gram of Rocephin was helpful in reducing her symptoms before. She now complains of fatigue,  nasal congestion, and sweats at different times. She denies fever, diarrhea, N/V, body ache, or headache. She has not taken any OTC medications at this time. She has no other complaints at this time.  She feels right facial pain, still purulent congestion, fatigue, sweats. No hx of allergys, but has occ bronchospasm. Her SLE and sjorgrens makes her eyes and sinuses targets for infection.  Needs meds refilled   Review of Systems     Objective:   Physical Exam  Vitals reviewed. Constitutional: She is oriented to person, place, and time. She appears well-developed and well-nourished. No distress.  HENT:  Right Ear: External ear normal.  Left Ear: External ear normal.  Nose: Mucosal edema, rhinorrhea and sinus tenderness present. Epistaxis is observed. Right sinus exhibits no maxillary sinus tenderness and no frontal sinus tenderness. Left sinus exhibits maxillary sinus tenderness and frontal sinus tenderness.  Mouth/Throat: Oropharynx is clear and moist.  Eyes: EOM are normal. Pupils are equal, round, and reactive to light.  Neck: Normal range of motion. Neck supple.  Pulmonary/Chest: Effort normal.  Musculoskeletal: Normal range of motion.  Lymphadenopathy:    She has cervical adenopathy.  Neurological: She is alert and oriented to person, place, and time. She exhibits normal muscle tone. Coordination normal.  Skin: Rash noted.          Assessment & Plan:  Rocephin 1g IM/Trial fluticasone nasal daily Finish levaquin RF chronic meds  May need another rocephin shot

## 2014-05-31 ENCOUNTER — Encounter: Payer: Self-pay | Admitting: Family Medicine

## 2014-05-31 ENCOUNTER — Ambulatory Visit (INDEPENDENT_AMBULATORY_CARE_PROVIDER_SITE_OTHER): Payer: Medicare Other | Admitting: Family Medicine

## 2014-05-31 VITALS — BP 120/90 | HR 79 | Temp 98.2°F | Resp 16 | Ht 65.0 in | Wt 209.6 lb

## 2014-05-31 DIAGNOSIS — R002 Palpitations: Secondary | ICD-10-CM

## 2014-05-31 DIAGNOSIS — Z569 Unspecified problems related to employment: Secondary | ICD-10-CM

## 2014-05-31 DIAGNOSIS — Z566 Other physical and mental strain related to work: Secondary | ICD-10-CM

## 2014-05-31 DIAGNOSIS — I10 Essential (primary) hypertension: Secondary | ICD-10-CM

## 2014-05-31 MED ORDER — LOSARTAN POTASSIUM 50 MG PO TABS: 50.0000 mg | ORAL_TABLET | Freq: Every day | ORAL | Status: DC

## 2014-05-31 MED ORDER — METOPROLOL SUCCINATE ER 25 MG PO TB24: 25.0000 mg | ORAL_TABLET | Freq: Every day | ORAL | Status: DC

## 2014-05-31 NOTE — Patient Instructions (Signed)
Stress Stress-related medical problems are becoming increasingly common. The body has a built-in physical response to stressful situations. Faced with pressure, challenge or danger, we need to react quickly. Our bodies release hormones such as cortisol and adrenaline to help do this. These hormones are part of the "fight or flight" response and affect the metabolic rate, heart rate and blood pressure, resulting in a heightened, stressed state that prepares the body for optimum performance in dealing with a stressful situation. It is likely that early man required these mechanisms to stay alive, but usually modern stresses do not call for this, and the same hormones released in today's world can damage health and reduce coping ability. CAUSES  Pressure to perform at work, at school or in sports.  Threats of physical violence.  Money worries.  Arguments.  Family conflicts.  Divorce or separation from significant other.  Bereavement.  New job or unemployment.  Changes in location.  Alcohol or drug abuse. SOMETIMES, THERE IS NO PARTICULAR REASON FOR DEVELOPING STRESS. Almost all people are at risk of being stressed at some time in their lives. It is important to know that some stress is temporary and some is long term.  Temporary stress will go away when a situation is resolved. Most people can cope with short periods of stress, and it can often be relieved by relaxing, taking a walk or getting any type of exercise, chatting through issues with friends, or having a good night's sleep.  Chronic (long-term, continuous) stress is much harder to deal with. It can be psychologically and emotionally damaging. It can be harmful both for an individual and for friends and family. SYMPTOMS Everyone reacts to stress differently. There are some common effects that help us recognize it. In times of extreme stress, people may:  Shake uncontrollably.  Breathe faster and deeper than normal  (hyperventilate).  Vomit.  For people with asthma, stress can trigger an attack.  For some people, stress may trigger migraine headaches, ulcers, and body pain. PHYSICAL EFFECTS OF STRESS MAY INCLUDE:  Loss of energy.  Skin problems.  Aches and pains resulting from tense muscles, including neck ache, backache and tension headaches.  Increased pain from arthritis and other conditions.  Irregular heart beat (palpitations).  Periods of irritability or anger.  Apathy or depression.  Anxiety (feeling uptight or worrying).  Unusual behavior.  Loss of appetite.  Comfort eating.  Lack of concentration.  Loss of, or decreased, sex-drive.  Increased smoking, drinking, or recreational drug use.  For women, missed periods.  Ulcers, joint pain, and muscle pain. Post-traumatic stress is the stress caused by any serious accident, strong emotional damage, or extremely difficult or violent experience such as rape or war. Post-traumatic stress victims can experience mixtures of emotions such as fear, shame, depression, guilt or anger. It may include recurrent memories or images that may be haunting. These feelings can last for weeks, months or even years after the traumatic event that triggered them. Specialized treatment, possibly with medicines and psychological therapies, is available. If stress is causing physical symptoms, severe distress or making it difficult for you to function as normal, it is worth seeing your caregiver. It is important to remember that although stress is a usual part of life, extreme or prolonged stress can lead to other illnesses that will need treatment. It is better to visit a doctor sooner rather than later. Stress has been linked to the development of high blood pressure and heart disease, as well as insomnia and depression.   There is no diagnostic test for stress since everyone reacts to it differently. But a caregiver will be able to spot the physical  symptoms, such as:  Headaches.  Shingles.  Ulcers. Emotional distress such as intense worry, low mood or irritability should be detected when the doctor asks pertinent questions to identify any underlying problems that might be the cause. In case there are physical reasons for the symptoms, the doctor may also want to do some tests to exclude certain conditions. If you feel that you are suffering from stress, try to identify the aspects of your life that are causing it. Sometimes you may not be able to change or avoid them, but even a small change can have a positive ripple effect. A simple lifestyle change can make all the difference. STRATEGIES THAT CAN HELP DEAL WITH STRESS:  Delegating or sharing responsibilities.  Avoiding confrontations.  Learning to be more assertive.  Regular exercise.  Avoid using alcohol or street drugs to cope.  Eating a healthy, balanced diet, rich in fruit and vegetables and proteins.  Finding humor or absurdity in stressful situations.  Never taking on more than you know you can handle comfortably.  Organizing your time better to get as much done as possible.  Talking to friends or family and sharing your thoughts and fears.  Listening to music or relaxation tapes.  Relaxation techniques like deep breathing, meditation, and yoga.  Tensing and then relaxing your muscles, starting at the toes and working up to the head and neck. If you think that you would benefit from help, either in identifying the things that are causing your stress or in learning techniques to help you relax, see a caregiver who is capable of helping you with this. Rather than relying on medications, it is usually better to try and identify the things in your life that are causing stress and try to deal with them. There are many techniques of managing stress including counseling, psychotherapy, aromatherapy, yoga, and exercise. Your caregiver can help you determine what is best  for you. Document Released: 01/18/2003 Document Revised: 11/02/2013 Document Reviewed: 12/15/2007 [facility] Patient Information 2015 [place], [place]. This information is not intended to replace advice given to you by your health care provider. Make sure you discuss any questions you have with your health care provider.    I have decreased LOSARTAN to 50 mg 1 tablet daily and added METOPROLOL which is a B-blocker to help slow your heart rate.  I will see you again in 3 weeks to see how these meds are working. Some adjustment may need to be made at the next visit.  Continue to monitor your BP at home.

## 2014-05-31 NOTE — Progress Notes (Signed)
S:  This 53 y.o. Cauc female has HTN; BP monitoring at home w/ SBP 130-135 and DBP 90-101. Pt takes Losartan daily but uses HCTZ prn elevated BP and palpitations. She has problems w/ hypokalemia so she opts to take HCTZ when she thinks BP is above normal. Palpitations associated w/ job-related stress. Pt avoids salt but does drink diet sodas and uses artificial sweeteners.   Pt denies diaphoresis, fatigue, vision disturbances, CP or tightness, SOB or DOE, cough, rash, HA, dizziness, numbness, weakness or syncope.  Patient Active Problem List   Diagnosis Date Noted  . Systemic lupus erythematosus 04/26/2014  . Abdominal  pain, other specified site 07/28/2013  . Unspecified constipation 07/28/2013  . Obstructive sleep apnea 12/25/2012  . Menopause 10/18/2012  . Elevated LFTs 10/18/2012  . Palpitations 10/18/2012  . Essential hypertension, benign 10/18/2012  . Melanoma 10/02/2012  . SLE (systemic lupus erythematosus) 08/17/2012  . Dysplastic nevus 08/04/2012  . ADD (attention deficit disorder) 02/26/2012  . Depression with anxiety 02/26/2012  . Insomnia 02/26/2012  . Diverticulitis of large intestine with perforation 01/21/2012  . Reactive airways 01/20/2012  . Encounter for long-term (current) use of other medications 01/15/2012  . Diffuse disease of connective tissue 09/11/2011  . DIVERTICULITIS, COLON 10/03/2010  . GERD 01/08/2010  . 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 CHOLECYSTECTOMY, HX OF 01/08/2010   Prior to Admission medications   Medication Sig Start Date End Date Taking? Authorizing Provider  albuterol (PROVENTIL HFA;VENTOLIN HFA) 108 (90 BASE) MCG/ACT inhaler Inhale 2 puffs into the lungs every 6 (six) hours as needed. For shortness of breath. 04/29/14  Yes Orma Flaming, MD  amphetamine-dextroamphetamine (ADDERALL) 10 MG tablet Take 10 mg by mouth as needed. 08/16/13  Yes Wardell Honour, MD  estradiol (ESTRACE) 1 MG tablet Take 1 mg by mouth 2 (two) times a week. 05/31/13  Yes  Wardell Honour, MD  fluticasone (FLONASE) 50 MCG/ACT nasal spray Place 2 sprays into both nostrils daily. 04/29/14  Yes Orma Flaming, MD  LORazepam (ATIVAN) 1 MG tablet Take 1 tablet (1 mg total) by mouth 2 (two) times daily as needed. For anxiety 01/19/14  Yes Wardell Honour, MD  potassium chloride (KLOR-CON) 20 MEQ packet Take 20 mEq by mouth 2 (two) times daily.   Yes Historical Provider, MD  zolpidem (AMBIEN) 10 MG tablet Take 1 tablet (10 mg total) by mouth at bedtime as needed. For insomnia 04/29/14  Yes Orma Flaming, MD  losartan (COZAAR) 50 MG tablet Take 1 tablet (100 mg total) by mouth daily.    Barton Fanny, MD   PMHx, Surg Hx, Soc and Fam Hx reviewed.  ROS: AS per HPI.  O: Filed Vitals:   05/31/14 1522  BP: 120/90  Pulse: 79  Temp: 98.2 F (36.8 C)  Resp: 16   GEN: in NAD; WN,WD. HENT: [place]/AT; EOMI w/ clear conj/sclerae. Otherwise unremarkable. NECK: Supple w/o LAN or TMG. COR: RRR; normal S1 and S2 w/o m/g/r. No edema. LUNGS: CTA; normal resp rate and effort. No wheezes or rales. SKIN: W&D; intact w/o diaphoresis, erythema or pallor. NEURO: A&O x 3; CNs intact. Nonfocal.  A/P: Essential hypertension, benign - Medication changes- decrease Losartan to 50 mg 1 tablet daily; discontinue HCTZ. Add Metoprolol succinate (Toprol -XL) 25 mg 1 table daily to control palpitations. Advised pt to avoid artificial sweeteners.  Plan: BASIC METABOLIC PANEL WITH GFR  Palpitations - Plan: BASIC METABOLIC PANEL WITH GFR  Meds ordered this encounter  Medications  .  losartan (COZAAR) 50 MG tablet    Sig: Take 1 tablet (50 mg total) by mouth daily.    Dispense:  90 tablet    Refill:  1  . metoprolol succinate (TOPROL-XL) 25 MG 24 hr tablet    Sig: Take 1 tablet (25 mg total) by mouth daily.    Dispense:  90 tablet    Refill:  1

## 2014-06-01 ENCOUNTER — Ambulatory Visit: Payer: Medicare Other | Admitting: Family Medicine

## 2014-06-01 LAB — BASIC METABOLIC PANEL WITH GFR
BUN: 11 mg/dL (ref 6–23)
CALCIUM: 8.9 mg/dL (ref 8.4–10.5)
CO2: 27 mEq/L (ref 19–32)
Chloride: 103 mEq/L (ref 96–112)
Creat: 0.79 mg/dL (ref 0.50–1.10)
GFR, Est African American: >89 mL/min
GFR, Est Non African American: 86 mL/min
GLUCOSE: 85 mg/dL (ref 70–99)
Potassium: 3.8 mEq/L (ref 3.5–5.3)
Sodium: 138 mEq/L (ref 135–145)

## 2014-06-07 ENCOUNTER — Encounter: Payer: Self-pay | Admitting: Family Medicine

## 2014-06-07 ENCOUNTER — Ambulatory Visit (INDEPENDENT_AMBULATORY_CARE_PROVIDER_SITE_OTHER): Payer: Medicare Other | Admitting: Neurology

## 2014-06-07 ENCOUNTER — Encounter: Payer: Self-pay | Admitting: *Deleted

## 2014-06-07 ENCOUNTER — Ambulatory Visit (INDEPENDENT_AMBULATORY_CARE_PROVIDER_SITE_OTHER): Payer: Medicare Other | Admitting: Family Medicine

## 2014-06-07 VITALS — BP 126/81 | HR 76 | Temp 98.4°F | Resp 16 | Ht 64.5 in | Wt 211.0 lb

## 2014-06-07 VITALS — BP 136/96 | HR 82 | Resp 16 | Ht 66.25 in | Wt 213.0 lb

## 2014-06-07 DIAGNOSIS — I10 Essential (primary) hypertension: Secondary | ICD-10-CM

## 2014-06-07 DIAGNOSIS — Z9989 Dependence on other enabling machines and devices: Principal | ICD-10-CM

## 2014-06-07 DIAGNOSIS — E669 Obesity, unspecified: Secondary | ICD-10-CM | POA: Insufficient documentation

## 2014-06-07 DIAGNOSIS — L930 Discoid lupus erythematosus: Secondary | ICD-10-CM

## 2014-06-07 DIAGNOSIS — R894 Abnormal immunological findings in specimens from other organs, systems and tissues: Secondary | ICD-10-CM

## 2014-06-07 DIAGNOSIS — R768 Other specified abnormal immunological findings in serum: Secondary | ICD-10-CM | POA: Insufficient documentation

## 2014-06-07 DIAGNOSIS — G4733 Obstructive sleep apnea (adult) (pediatric): Secondary | ICD-10-CM | POA: Insufficient documentation

## 2014-06-07 MED ORDER — POTASSIUM CHLORIDE 20 MEQ PO PACK: PACK | ORAL | Status: DC

## 2014-06-07 NOTE — Progress Notes (Signed)
[facility] [place]   Provider:  Larey Seat, [place] D  Referring Provider: Wardell Honour, MD Primary Care Physician:  Ellsworth Lennox, MD  Chief Complaint  Patient presents with  . Follow-up    Room 10  . Sleep consult    HPI:  Abigail [name] is a 53 y.o. female , who  seen here as a revisit for new CPAP supplies.     Originally referred by Dr. Tamala Julian for a sleep study in 2013, after she gained weight from Steroid therapy for Lupus and Sicca syndrome. Her study from 10-2012 documented an AHI of 28.2 and RDI of 37.3 /hr. Nadir 79% .  She was titrated to 6 cm water , nuance pro mask.  She underwent a chin lift this Summer  and was not able to use her CPAP for a couple of weeks until healed. She now needs a new filter and mask, perhaps a refitting. She had 2 URIs this years.      Review of Systems: Out of a complete 14 system review, the patient complains of only the following symptoms, and all other reviewed systems are negative:  Sleepiness, snoring, URI. Epworth score is 20 and FSS 54.  Photophobia and migraines.   History   Social History  . Marital Status: Married    Spouse Name: [name]    Number of Children: 2  . Years of Education: Bachelors   Occupational History  .  [employer]   Social History Main Topics  . Smoking status: Never Smoker   . Smokeless tobacco: Never Used  . Alcohol Use: Yes     Comment: 01/20/12 once/month  . Drug Use: No  . Sexual Activity: Yes    Partners: Male   Other Topics Concern  . Not on file   Social History Narrative   Marital status: married x 28 years to [place]; happily married; no abuse      Children: 2 children (26 yo son, 48 yo daughter); no grandchildren      Lives with: husband, daughter      Employment:  [employer] 2009-2013.  Resigned 08/2012.  Bachelor degrees in communication.     Patient works as a Surveyor, minerals.   Patient drinks 2 caffeine beverages daily.   Patient is left -handed.          Tobacco:  None      Alcohol:  Once weekly; wine      Drugs: none      Exercise:  none                Family History  Problem Relation Age of Onset  . Colon cancer Paternal Uncle   . Colon polyps Sister   . Depression Sister     suicide  . Colon polyps Brother   . Colon polyps Mother   . Dementia Mother   . Heart disease Father 73    CHF  . Macular degeneration Father     Past Medical History  Diagnosis Date  . Lupus   . Migraine   . Complication of anesthesia     Sjogrens Syndrome  . Sjogren's syndrome   . Vasculitis     "autoimmune"  . Diverticulitis   . Gastritis   . Duodenitis   . Pancreatitis   . Fibromyalgia   . Perforation bowel     "microperforation"  . Asthma   . Melanoma 2008    Back; [place].  . Depression   . GERD (gastroesophageal  reflux disease)   . Hypertension   . Urticaria   . Obstructive sleep apnea 12/25/2012    Past Surgical History  Procedure Laterality Date  . Melanoma excision    . Tmj arthroplasty  1970  . Appendectomy    . Cholecystectomy    . Pancreas surgery      sphincterotomy  . Abdominoplasty    . Liver biopsy    . Breast surgery    . Anterior cervical decomp/discectomy fusion  10/29/2011    Procedure: ANTERIOR CERVICAL DECOMPRESSION/DISCECTOMY FUSION 2 LEVELS;  Surgeon: Floyce Stakes;  Location: [facility];  Service: Neurosurgery;  Laterality: N/A;  Cervical five-six,Cervical six-seven nterior cervical decompression/diskectomy, fusion, plate  . Sigmoidectomy  03/2012    done at [facility]  . Abdominal hysterectomy      Partial; Ovaries intact.  DUB/fibroids.  . Colonoscopy  11/11/2008  . Sleep study  10/11/2012    +severe OSA.  [facility].  . Colonoscopy N/A 09/20/2013    Procedure: COLONOSCOPY;  Surgeon: Lafayette Dragon, MD;  Location: [facility];  Service: Endoscopy;  Laterality: N/A;    Current Outpatient Prescriptions  Medication Sig Dispense Refill  . albuterol (PROVENTIL HFA;VENTOLIN HFA) 108 (90 BASE)  MCG/ACT inhaler Inhale 2 puffs into the lungs every 6 (six) hours as needed. For shortness of breath.  1 Inhaler  2  . amphetamine-dextroamphetamine (ADDERALL) 10 MG tablet Take 10 mg by mouth as needed.      Marland Kitchen amphetamine-dextroamphetamine (ADDERALL) 10 MG tablet Take 1 tablet by mouth. As needed      . estradiol (ESTRACE) 1 MG tablet Take 1 mg by mouth 2 (two) times a week.      . fluticasone (FLONASE) 50 MCG/ACT nasal spray Place 2 sprays into both nostrils daily.  16 g  6  . LORazepam (ATIVAN) 1 MG tablet Take 1 tablet (1 mg total) by mouth 2 (two) times daily as needed. For anxiety  60 tablet  3  . losartan (COZAAR) 50 MG tablet Take 1 tablet (50 mg total) by mouth daily.  90 tablet  1  . metoprolol succinate (TOPROL-XL) 25 MG 24 hr tablet Take 1 tablet (25 mg total) by mouth daily.  90 tablet  1  . potassium chloride (KLOR-CON) 20 MEQ packet Take 20 mEq by mouth 2 (two) times daily.      . potassium chloride (MICRO-K) 10 MEQ CR capsule Take 1 capsule by mouth. As needed      . zolpidem (AMBIEN) 10 MG tablet Take 1 tablet (10 mg total) by mouth at bedtime as needed. For insomnia  90 tablet  2   No current facility-administered medications for this visit.    Allergies as of 06/07/2014 - Review Complete 06/07/2014  Allergen Reaction Noted  . Meperidine hcl Other (See Comments)   . Shrimp [shellfish allergy] Shortness Of Breath 11/14/2012  . Doxycycline Hives   . Latex Other (See Comments) 10/28/2011  . Promethazine hcl Other (See Comments) 10/28/2011  . Sulfonamide derivatives Hives   . Tetracyclines & related Rash 01/18/2012    Vitals: BP 136/96  Pulse 82  Resp 16  Ht 5' 6.25" (1.683 m)  Wt 213 lb (96.616 kg)  BMI 34.11 kg/m2 Last Weight:  Wt Readings from Last 1 Encounters:  06/07/14 213 lb (96.616 kg)   Last Height:   Ht Readings from Last 1 Encounters:  06/07/14 5' 6.25" (1.683 m)   BMI  34.11   Physical exam:  General: The patient is awake,  alert and appears not  in acute distress. The patient is well groomed. Head: Normocephalic, atraumatic. Neck is supple. Mallampati 4  neck circumference: 17 inches .  Cardiovascular:  Regular rate and rhythm , without  murmurs or carotid bruit, and without distended neck veins. Respiratory: Lungs are clear to auscultation. Skin:  Without evidence of edema, or rash Trunk: BMI is  elevated and patient  has normal posture.  Neurologic exam : The patient is awake and alert, oriented to place and time.  Memory subjective described as intact. There is a normal attention span & concentration ability.  Speech is fluent without  dysarthria, dysphonia or aphasia. Mood and affect are appropriate.  Cranial nerves: Pupils are  briskly reactive to light.  The left pupil is slightly larger and the right eye has a ptosis.  Funduscopic exam without evidence of pallor or edema. Extraocular movements in vertical and horizontal planes intact and without nystagmus.  Visual fields by finger perimetry are intact. Hearing to finger rub intact.  Facial sensation intact to fine touch.  Facial motor strength is symmetric and tongue and uvula move midline.  Motor exam:   Normal tone and symmetric normal strength in all extremities.  Sensory:  Fine touch, pinprick and vibration were normal.  Coordination: Rapid alternating movements in the fingers/hands is tested and normal.  Finger-to-nose maneuver tested and normal without evidence of ataxia, dysmetria or tremor.  Gait and station: Patient walks without assistive device.  Deep tendon reflexes: in the  upper and lower extremities are symmetric and intact. Babinski maneuver response is downgoing.   Assessment:   After physical and neurologic examination, review of laboratory studies, imaging, neurophysiology testing and pre-existing records, assessment is: OSA, currently untreated with relapse into excessive daytime sleepiness.   Plan:  Treatment plan and additional workup  :  return to daily CPAP, 6 cm water with new supplies and a refitting for the mask. weight loss strategies.  Ptosis and pupillary asymmetry.   Myasthenia labs ordered. This patient is ANA positive ,SSB positive , RA positive. Has migraines .

## 2014-06-07 NOTE — Patient Instructions (Signed)
Sleep Apnea  Sleep apnea is a sleep disorder characterized by abnormal pauses in breathing while you sleep. When your breathing pauses, the level of oxygen in your blood decreases. This causes you to move out of deep sleep and into light sleep. As a result, your quality of sleep is poor, and the system that carries your blood throughout your body (cardiovascular system) experiences stress. If sleep apnea remains untreated, the following conditions can develop:  High blood pressure (hypertension).  Coronary artery disease.  Inability to achieve or maintain an erection (impotence).  Impairment of your thought process (cognitive dysfunction). There are three types of sleep apnea: 1. Obstructive sleep apnea--Pauses in breathing during sleep because of a blocked airway. 2. Central sleep apnea--Pauses in breathing during sleep because the area of the brain that controls your breathing does not send the correct signals to the muscles that control breathing. 3. Mixed sleep apnea--A combination of both obstructive and central sleep apnea. RISK FACTORS The following risk factors can increase your risk of developing sleep apnea:  Being overweight.  Smoking.  Having narrow passages in your nose and throat.  Being of older age.  Being female.  Alcohol use.  Sedative and tranquilizer use.  Ethnicity. Among individuals younger than 35 years, African Americans are at increased risk of sleep apnea. SYMPTOMS   Difficulty staying asleep.  Daytime sleepiness and fatigue.  Loss of energy.  Irritability.  Loud, heavy snoring.  Morning headaches.  Trouble concentrating.  Forgetfulness.  Decreased interest in sex. DIAGNOSIS  In order to diagnose sleep apnea, your caregiver will perform a physical examination. Your caregiver may suggest that you take a home sleep test. Your caregiver may also recommend that you spend the night in a sleep lab. In the sleep lab, several monitors record  information about your heart, lungs, and brain while you sleep. Your leg and arm movements and blood oxygen level are also recorded. TREATMENT The following actions may help to resolve mild sleep apnea:  Sleeping on your side.   Using a decongestant if you have nasal congestion.   Avoiding the use of depressants, including alcohol, sedatives, and narcotics.   Losing weight and modifying your diet if you are overweight. There also are devices and treatments to help open your airway:  Oral appliances. These are custom-made mouthpieces that shift your lower jaw forward and slightly open your bite. This opens your airway.  Devices that create positive airway pressure. This positive pressure "splints" your airway open to help you breathe better during sleep. The following devices create positive airway pressure:  Continuous positive airway pressure (CPAP) device. The CPAP device creates a continuous level of air pressure with an air pump. The air is delivered to your airway through a mask while you sleep. This continuous pressure keeps your airway open.  Nasal expiratory positive airway pressure (EPAP) device. The EPAP device creates positive air pressure as you exhale. The device consists of single-use valves, which are inserted into each nostril and held in place by adhesive. The valves create very little resistance when you inhale but create much more resistance when you exhale. That increased resistance creates the positive airway pressure. This positive pressure while you exhale keeps your airway open, making it easier to breath when you inhale again.  Bilevel positive airway pressure (BPAP) device. The BPAP device is used mainly in patients with central sleep apnea. This device is similar to the CPAP device because it also uses an air pump to deliver continuous air pressure   through a mask. However, with the BPAP machine, the pressure is set at two different levels. The pressure when you  exhale is lower than the pressure when you inhale.  Surgery. Typically, surgery is only done if you cannot comply with less invasive treatments or if the less invasive treatments do not improve your condition. Surgery involves removing excess tissue in your airway to create a wider passage way. Document Released: 10/18/2002 Document Revised: 02/22/2013 Document Reviewed: 03/05/2012 [employer] Patient Information 2015 [employer]. This information is not intended to replace advice given to you by your health care provider. Make sure you discuss any questions you have with your health care provider.  

## 2014-06-07 NOTE — Patient Instructions (Signed)
I am going to give you some information about anti-inflammatory nutrition to help reduce inflammation throughout your body.

## 2014-06-07 NOTE — Progress Notes (Signed)
S:  This 53 y.o. Cauc female is here for HTN follow-up. She has tolerated the medication change well; no medication side effects. She noted BP was elevated during a recent field trip with some students. Pt took Ativan and noted that she felt better and BP responded accordingly. She has increased intake of K+-rich foods.   Patient Active Problem List   Diagnosis Date Noted  . OSA on CPAP 06/07/2014  . Obesity (BMI 30-39.9) 06/07/2014  . SS-B antibody positive 06/07/2014  . Unspecified constipation 07/28/2013  . Menopause 10/18/2012  . Palpitations 10/18/2012  . Essential hypertension, benign 10/18/2012  . Melanoma 10/02/2012  . SLE (systemic lupus erythematosus) 08/17/2012  . Dysplastic nevus 08/04/2012  . ADD (attention deficit disorder) 02/26/2012  . Depression with anxiety 02/26/2012  . Insomnia 02/26/2012  . Reactive airways 01/20/2012  . Diffuse disease of connective tissue 09/11/2011  . DIVERTICULITIS, COLON 10/03/2010  . GERD 01/08/2010  . [place] SYNDROME 01/08/2010  . CHOLECYSTECTOMY, HX OF 01/08/2010     Outpatient Encounter Prescriptions as of 06/07/2014  Medication Sig Note  . albuterol (PROVENTIL HFA;VENTOLIN HFA) 108 (90 BASE) MCG/ACT inhaler Inhale 2 puffs into the lungs every 6 (six) hours as needed. For shortness of breath.   . amphetamine-dextroamphetamine (ADDERALL) 10 MG tablet Take 10 mg by mouth as needed.   Marland Kitchen amphetamine-dextroamphetamine (ADDERALL) 10 MG tablet Take 1 tablet by mouth. As needed 06/07/2014: Received from: [facility]  . estradiol (ESTRACE) 1 MG tablet Take 1 mg by mouth 2 (two) times a week.   . fluticasone (FLONASE) 50 MCG/ACT nasal spray Place 2 sprays into both nostrils daily.   Marland Kitchen LORazepam (ATIVAN) 1 MG tablet Take 1 tablet (1 mg total) by mouth 2 (two) times daily as needed. For anxiety   . losartan (COZAAR) 50 MG tablet Take 1 tablet (50 mg total) by mouth daily.   . metoprolol succinate (TOPROL-XL) 25 MG 24 hr tablet  Take 1 tablet (25 mg total) by mouth daily.   . potassium chloride (KLOR-CON) 20 MEQ packet Take 1/2 tablet twice daily prn.   . zolpidem (AMBIEN) 10 MG tablet Take 1 tablet (10 mg total) by mouth at bedtime as needed. For insomnia     PMHx, Surg Hx, Soc and Fam Hx reviewed.  ROS: As per HPI; negative for fatigue, diaphoresis, CP or tightness, palpitations, SOB or DOE, HA, dizziness, numbness, weakness or syncope.  O: Filed Vitals:   06/07/14 1336  BP: 126/81  Pulse: 76  Temp: 98.4 F (36.9 C)  Resp: 16   GEN: in NAD; WN,WD. HENT: [place]/AT; EOMI w/ clear conj/sclerae. Otherwise normal. SKIN: W&D; intact w/o diaphoresis, erythema or pallor. COR: RRR. LUNGS: Unlabored resp. NEURO: A&O x 3; CNs intact. Nonfocal.  A/P: Essential hypertension, benign- Stable and well controlled on current medications. RTC in 4 weeks. Continue to monitor BP at home.

## 2014-06-10 LAB — ACETYLCHOLINE RECEPTOR, BLOCKING: ACHR BLOCKING ABS, SERUM: 25 % (ref 0–25)

## 2014-06-14 ENCOUNTER — Observation Stay (HOSPITAL_COMMUNITY)
Admission: EM | Admit: 2014-06-14 | Discharge: 2014-06-15 | Disposition: A | Discharge status: Home / Self Care | Payer: Medicare Other | Attending: Cardiology | Admitting: Cardiology

## 2014-06-14 ENCOUNTER — Encounter (HOSPITAL_COMMUNITY): Payer: Self-pay | Admitting: Emergency Medicine

## 2014-06-14 ENCOUNTER — Ambulatory Visit (INDEPENDENT_AMBULATORY_CARE_PROVIDER_SITE_OTHER): Payer: Medicare Other | Admitting: Internal Medicine

## 2014-06-14 ENCOUNTER — Emergency Department (HOSPITAL_COMMUNITY): Payer: Medicare Other

## 2014-06-14 VITALS — BP 128/84 | HR 84 | Temp 97.6°F | Resp 16 | Ht 64.5 in | Wt 210.0 lb

## 2014-06-14 DIAGNOSIS — Z8249 Family history of ischemic heart disease and other diseases of the circulatory system: Secondary | ICD-10-CM | POA: Insufficient documentation

## 2014-06-14 DIAGNOSIS — M79602 Pain in left arm: Secondary | ICD-10-CM

## 2014-06-14 DIAGNOSIS — M3500 Sicca syndrome, unspecified: Secondary | ICD-10-CM | POA: Diagnosis not present

## 2014-06-14 DIAGNOSIS — M79609 Pain in unspecified limb: Secondary | ICD-10-CM | POA: Diagnosis present

## 2014-06-14 DIAGNOSIS — R002 Palpitations: Secondary | ICD-10-CM | POA: Diagnosis not present

## 2014-06-14 DIAGNOSIS — F988 Other specified behavioral and emotional disorders with onset usually occurring in childhood and adolescence: Secondary | ICD-10-CM | POA: Diagnosis present

## 2014-06-14 DIAGNOSIS — I10 Essential (primary) hypertension: Secondary | ICD-10-CM | POA: Diagnosis not present

## 2014-06-14 DIAGNOSIS — G4733 Obstructive sleep apnea (adult) (pediatric): Secondary | ICD-10-CM | POA: Diagnosis not present

## 2014-06-14 DIAGNOSIS — M329 Systemic lupus erythematosus, unspecified: Secondary | ICD-10-CM | POA: Diagnosis not present

## 2014-06-14 DIAGNOSIS — I776 Arteritis, unspecified: Secondary | ICD-10-CM | POA: Diagnosis not present

## 2014-06-14 DIAGNOSIS — I2489 Other forms of acute ischemic heart disease: Secondary | ICD-10-CM

## 2014-06-14 DIAGNOSIS — I249 Acute ischemic heart disease, unspecified: Secondary | ICD-10-CM

## 2014-06-14 DIAGNOSIS — Z6839 Body mass index (BMI) 39.0-39.9, adult: Secondary | ICD-10-CM | POA: Diagnosis not present

## 2014-06-14 DIAGNOSIS — J45909 Unspecified asthma, uncomplicated: Secondary | ICD-10-CM | POA: Diagnosis not present

## 2014-06-14 DIAGNOSIS — R110 Nausea: Secondary | ICD-10-CM

## 2014-06-14 DIAGNOSIS — I200 Unstable angina: Principal | ICD-10-CM | POA: Insufficient documentation

## 2014-06-14 DIAGNOSIS — IMO0001 Reserved for inherently not codable concepts without codable children: Secondary | ICD-10-CM | POA: Diagnosis not present

## 2014-06-14 DIAGNOSIS — R61 Generalized hyperhidrosis: Secondary | ICD-10-CM

## 2014-06-14 DIAGNOSIS — E669 Obesity, unspecified: Secondary | ICD-10-CM | POA: Diagnosis not present

## 2014-06-14 DIAGNOSIS — R0789 Other chest pain: Secondary | ICD-10-CM | POA: Diagnosis present

## 2014-06-14 DIAGNOSIS — Z9989 Dependence on other enabling machines and devices: Secondary | ICD-10-CM

## 2014-06-14 DIAGNOSIS — I219 Acute myocardial infarction, unspecified: Secondary | ICD-10-CM

## 2014-06-14 DIAGNOSIS — I248 Other forms of acute ischemic heart disease: Secondary | ICD-10-CM

## 2014-06-14 HISTORY — DX: Systemic lupus erythematosus, unspecified: M32.9

## 2014-06-14 LAB — COMPREHENSIVE METABOLIC PANEL
ALT: 36 U/L — ABNORMAL HIGH (ref 0–35)
ANION GAP: 12 (ref 5–15)
AST: 28 U/L (ref 0–37)
Albumin: 3.6 g/dL (ref 3.5–5.2)
Alkaline Phosphatase: 75 U/L (ref 39–117)
BUN: 12 mg/dL (ref 6–23)
CO2: 21 mEq/L (ref 19–32)
CREATININE: 0.75 mg/dL (ref 0.50–1.10)
Calcium: 8.7 mg/dL (ref 8.4–10.5)
Chloride: 107 mEq/L (ref 96–112)
GFR calc non Af Amer: >90 mL/min (ref >90)
Glucose, Bld: 99 mg/dL (ref 70–99)
Potassium: 4.1 mEq/L (ref 3.7–5.3)
Sodium: 140 mEq/L (ref 137–147)
TOTAL PROTEIN: 7.2 g/dL (ref 6.0–8.3)
Total Bilirubin: <0.2 mg/dL — ABNORMAL LOW (ref 0.3–1.2)

## 2014-06-14 LAB — CBC
HEMATOCRIT: 40.2 % (ref 36.0–46.0)
Hemoglobin: 13.7 g/dL (ref 12.0–15.0)
MCH: 28.2 pg (ref 26.0–34.0)
MCHC: 34.1 g/dL (ref 30.0–36.0)
MCV: 82.9 fL (ref 78.0–100.0)
Platelets: 230 10*3/uL (ref 150–400)
RBC: 4.85 MIL/uL (ref 3.87–5.11)
RDW: 13.5 % (ref 11.5–15.5)
WBC: 4.6 10*3/uL (ref 4.0–10.5)

## 2014-06-14 LAB — I-STAT TROPONIN, ED: TROPONIN I, POC: 0 ng/mL (ref 0.00–0.08)

## 2014-06-14 LAB — TROPONIN I
Troponin I: <0.3 ng/mL (ref <0.30)
Troponin I: <0.3 ng/mL (ref <0.30)

## 2014-06-14 LAB — HEPARIN LEVEL (UNFRACTIONATED): Heparin Unfractionated: 0.39 IU/mL (ref 0.30–0.70)

## 2014-06-14 MED ORDER — SODIUM CHLORIDE 0.9 % IV SOLN
1.0000 mL/kg/h | INTRAVENOUS | Status: DC
  Administered 2014-06-14 – 2014-06-15 (×2): 1 mL/kg/h via INTRAVENOUS

## 2014-06-14 MED ORDER — ACETAMINOPHEN 325 MG PO TABS
650.0000 mg | ORAL_TABLET | ORAL | Status: DC | PRN
  Administered 2014-06-15: 650 mg via ORAL
  Filled 2014-06-14: qty 2

## 2014-06-14 MED ORDER — ASPIRIN 81 MG PO CHEW
81.0000 mg | CHEWABLE_TABLET | Freq: Once | ORAL | Status: AC
  Administered 2014-06-14: 81 mg via ORAL

## 2014-06-14 MED ORDER — ALBUTEROL SULFATE HFA 108 (90 BASE) MCG/ACT IN AERS: 2.0000 | INHALATION_SPRAY | Freq: Four times a day (QID) | RESPIRATORY_TRACT | Status: DC | PRN

## 2014-06-14 MED ORDER — LORAZEPAM 1 MG PO TABS
1.0000 mg | ORAL_TABLET | Freq: Once | ORAL | Status: AC
  Administered 2014-06-14: 1 mg via ORAL
  Filled 2014-06-14: qty 1

## 2014-06-14 MED ORDER — ALPRAZOLAM 0.25 MG PO TABS
0.2500 mg | ORAL_TABLET | Freq: Once | ORAL | Status: AC
  Administered 2014-06-14: 0.25 mg via ORAL

## 2014-06-14 MED ORDER — ALBUTEROL SULFATE (2.5 MG/3ML) 0.083% IN NEBU: 2.5000 mg | INHALATION_SOLUTION | Freq: Four times a day (QID) | RESPIRATORY_TRACT | Status: DC | PRN

## 2014-06-14 MED ORDER — METOPROLOL SUCCINATE ER 25 MG PO TB24
25.0000 mg | ORAL_TABLET | Freq: Every day | ORAL | Status: DC
  Administered 2014-06-14: 25 mg via ORAL
  Filled 2014-06-14 (×3): qty 1

## 2014-06-14 MED ORDER — SODIUM CHLORIDE 0.9 % IV SOLN: 250.0000 mL | INTRAVENOUS | Status: DC | PRN

## 2014-06-14 MED ORDER — ALPRAZOLAM 0.25 MG PO TABS: 0.2500 mg | ORAL_TABLET | Freq: Two times a day (BID) | ORAL | Status: DC | PRN

## 2014-06-14 MED ORDER — ONDANSETRON HCL 4 MG/2ML IJ SOLN
4.0000 mg | Freq: Once | INTRAMUSCULAR | Status: AC
  Administered 2014-06-14: 4 mg via INTRAVENOUS
  Filled 2014-06-14: qty 2

## 2014-06-14 MED ORDER — SODIUM CHLORIDE 0.9 % IJ SOLN: 3.0000 mL | INTRAMUSCULAR | Status: DC | PRN

## 2014-06-14 MED ORDER — LOSARTAN POTASSIUM 50 MG PO TABS
50.0000 mg | ORAL_TABLET | Freq: Every day | ORAL | Status: DC
  Administered 2014-06-14: 50 mg via ORAL
  Filled 2014-06-14 (×3): qty 1

## 2014-06-14 MED ORDER — ASPIRIN EC 81 MG PO TBEC
81.0000 mg | DELAYED_RELEASE_TABLET | Freq: Every day | ORAL | Status: DC
  Administered 2014-06-15: 81 mg via ORAL
  Filled 2014-06-14: qty 1

## 2014-06-14 MED ORDER — ALBUTEROL SULFATE HFA 108 (90 BASE) MCG/ACT IN AERS
2.0000 | INHALATION_SPRAY | Freq: Four times a day (QID) | RESPIRATORY_TRACT | Status: DC | PRN
  Filled 2014-06-14: qty 6.7

## 2014-06-14 MED ORDER — NITROGLYCERIN 0.4 MG SL SUBL: 0.4000 mg | SUBLINGUAL_TABLET | SUBLINGUAL | Status: DC | PRN

## 2014-06-14 MED ORDER — FLUTICASONE PROPIONATE 50 MCG/ACT NA SUSP
2.0000 | Freq: Every day | NASAL | Status: DC
  Administered 2014-06-14: 2 via NASAL
  Filled 2014-06-14: qty 16

## 2014-06-14 MED ORDER — HEPARIN BOLUS VIA INFUSION
4000.0000 [IU] | Freq: Once | INTRAVENOUS | Status: AC
  Administered 2014-06-14: 4000 [IU] via INTRAVENOUS
  Filled 2014-06-14: qty 4000

## 2014-06-14 MED ORDER — ENOXAPARIN SODIUM 40 MG/0.4ML ~~LOC~~ SOLN: 40.0000 mg | SUBCUTANEOUS | Status: DC

## 2014-06-14 MED ORDER — POTASSIUM CHLORIDE 20 MEQ PO PACK
20.0000 meq | PACK | Freq: Every day | ORAL | Status: DC
  Filled 2014-06-14: qty 1

## 2014-06-14 MED ORDER — ZOLPIDEM TARTRATE 5 MG PO TABS: 5.0000 mg | ORAL_TABLET | Freq: Every evening | ORAL | Status: DC | PRN

## 2014-06-14 MED ORDER — HEPARIN (PORCINE) IN NACL 100-0.45 UNIT/ML-% IJ SOLN
1000.0000 [IU]/h | INTRAMUSCULAR | Status: DC
  Administered 2014-06-14: 1000 [IU]/h via INTRAVENOUS
  Filled 2014-06-14 (×2): qty 250

## 2014-06-14 MED ORDER — SODIUM CHLORIDE 0.9 % IJ SOLN: 3.0000 mL | Freq: Two times a day (BID) | INTRAMUSCULAR | Status: DC

## 2014-06-14 MED ORDER — ONDANSETRON HCL 4 MG/2ML IJ SOLN: 4.0000 mg | Freq: Four times a day (QID) | INTRAMUSCULAR | Status: DC | PRN

## 2014-06-14 NOTE — Progress Notes (Signed)
ANTICOAGULATION CONSULT NOTE - Initial Consult  Pharmacy Consult for Heparin  Indication: chest pain/ACS  Allergies  Allergen Reactions  . Meperidine Hcl Other (See Comments)    tachycardia  . Shrimp [Shellfish Allergy] Shortness Of Breath  . Doxycycline Hives  . Latex Other (See Comments)    Breathing problems.  . Promethazine Hcl Other (See Comments)    "uncontrolled vomiting"  . Sulfonamide Derivatives Hives  . Tetracyclines & Related Rash    Patient Measurements: Height: 5' 4.57" (164 cm) Weight: 210 lb 1.6 oz (95.3 kg) IBW/kg (Calculated) : 56 Heparin Dosing Weight: 78kg   Vital Signs: Temp: 98.1 F (36.7 C) (08/04 1304) Temp src: Oral (08/04 1304) BP: 113/56 mmHg (08/04 1538) Pulse Rate: 71 (08/04 1538)  Labs:  Recent Labs  06/14/14 1328  HGB 13.7  HCT 40.2  PLT 230  CREATININE 0.75    Estimated Creatinine Clearance: 92.1 ml/min (by C-G formula based on Cr of 0.75).   Medical History: Past Medical History  Diagnosis Date  . Lupus   . Migraine   . Complication of anesthesia     Sjogrens Syndrome  . Sjogren's syndrome   . Vasculitis     "autoimmune"  . Diverticulitis   . Gastritis   . Duodenitis   . Pancreatitis   . Fibromyalgia   . Perforation bowel     "microperforation"  . Asthma   . Melanoma 2008    Back; Tafeen.  . Depression   . GERD (gastroesophageal reflux disease)   . Hypertension   . Urticaria   . Obstructive sleep apnea 12/25/2012    Medications:   (Not in a hospital admission)  Assessment: 89 YOF who presented to the ER with complaints of left arm pain which started yesterday evening. Pharmacy to start heparin for r/o ACS. Cardiac enzymes have been negative so far. H/H and Plt wnl. CrCl ~ 90 mL/min. Pt was not on any anticoagulation prior to admission   Goal of Therapy:  Heparin level 0.3-0.7 units/ml Monitor platelets by anticoagulation protocol: Yes   Plan:  -Give Heparin 4000 unit bolus followed by heparin infusion  at 1000 units/hr   -F/u 6 hour heparin level -Monitor daily HL, CBC, and s/s of bleeding   Albertina Parr, PharmD.  Clinical Pharmacist Pager [phone]

## 2014-06-14 NOTE — Progress Notes (Signed)
   Subjective:    Patient ID: Abigail [name], female    DOB: Mar 24, 1961, 53 y.o.   MRN: [id number]  HPI 53 year old female here with left forearm pain. She started taking Levofloxacin Sunday. She noticed the pain in her forearm yesterday. States she was playing with kids yesterday and not sure if that could have caused any pain. She was also lifting some heavy objects yesterday. Has tried Motrin and Tylenol with no relief. No chest pain. No SOB. Breaks out in a sweat. No abdominal pain.  Stat EKG reveals signs of Ischemia  Emergency  Protocol for acute coronary syndome initiated/EMTs on way Oxygen/ASA given Cardiology called and readied.  Review of Systems Has SLE and Sjorgrens syndrome    Objective:   Physical Exam  Constitutional: She is oriented to person, place, and time. She appears well-developed and well-nourished. She appears distressed.  HENT:  Head: Normocephalic.  Right Ear: External ear normal.  Left Ear: External ear normal.  Nose: Nose normal.  Mouth/Throat: Oropharynx is clear and moist.  Eyes: EOM are normal. Pupils are equal, round, and reactive to light.  Neck: Normal range of motion.  Cardiovascular: Normal rate, regular rhythm and normal heart sounds.   Pulmonary/Chest: Effort normal and breath sounds normal.  Abdominal: Soft.  Musculoskeletal: She exhibits tenderness.  Neurological: She is alert and oriented to person, place, and time. She exhibits normal muscle tone. Coordination normal.  Psychiatric: Her behavior is normal. Judgment and thought content normal. Her mood appears anxious. Cognition and memory are normal.    EMTs gave nitroglycerin sl, bp 132/82 Stat alprazolam for anxiety     Assessment & Plan:  To [facility] emts

## 2014-06-14 NOTE — H&P (Addendum)
Patient ID: Abigail [name] MRN: [id number], DOB/AGE: 1961-02-07   Admit date: 06/14/2014   Primary Physician: Ellsworth Lennox, MD Primary Cardiologist: Dr Ellyn Hack (new)  HPI:  53 y/o obese female, (Dr C.Guest's sister in law), who presents to the ER today from work ([employer])with complaints of Lt arm pain which has at times been "severe". Her symptoms started last PM (7pm) and have been intermittent since. No obvious aggravating or alleviating factors noted. This after noon at work she had "severe" pain associated with diaphoresis. She saw Dr Elder Cyphers. EKG was done and had NSST changes and he sent her to the ER for further evaluation as she was still having arm pain. She is now seen in the ER for further evaluation. During my interview she also admitted to an episode of chest pain earlier today.    Problem List: Past Medical History  Diagnosis Date  . Lupus   . Migraine   . Complication of anesthesia     Sjogrens Syndrome  . Sjogren's syndrome   . Vasculitis     "autoimmune"  . Diverticulitis   . Gastritis   . Duodenitis   . Pancreatitis   . Fibromyalgia   . Perforation bowel     "microperforation"  . Asthma   . Melanoma 2008    Back; Tafeen.  . Depression   . GERD (gastroesophageal reflux disease)   . Hypertension   . Urticaria   . Obstructive sleep apnea 12/25/2012    Past Surgical History  Procedure Laterality Date  . Melanoma excision    . Tmj arthroplasty  1970  . Appendectomy    . Cholecystectomy    . Pancreas surgery      sphincterotomy  . Abdominoplasty    . Liver biopsy    . Anterior cervical decomp/discectomy fusion  10/29/2011    Procedure: ANTERIOR CERVICAL DECOMPRESSION/DISCECTOMY FUSION 2 LEVELS;  Surgeon: Floyce Stakes;  Location: [facility];  Service: Neurosurgery;  Laterality: N/A;  Cervical five-six,Cervical six-seven nterior cervical decompression/diskectomy, fusion, plate  . Sigmoidectomy  03/2012    done at [facility]  .  Abdominal hysterectomy      Partial; Ovaries intact.  DUB/fibroids.  . Colonoscopy  11/11/2008  . Sleep study  10/11/2012    +severe OSA.  [facility].  . Colonoscopy N/A 09/20/2013    Procedure: COLONOSCOPY;  Surgeon: Lafayette Dragon, MD;  Location: [facility];  Service: Endoscopy;  Laterality: N/A;  . Reduction mammaplasty Bilateral   . Back surgery       Allergies:  Allergies  Allergen Reactions  . Meperidine Hcl Other (See Comments)    tachycardia  . Shrimp [Shellfish Allergy] Shortness Of Breath  . Doxycycline Hives  . Latex Other (See Comments)    Breathing problems.  . Promethazine Hcl Other (See Comments)    "uncontrolled vomiting"  . Sulfonamide Derivatives Hives  . Tetracyclines & Related Rash     Home Medications Current Facility-Administered Medications  Medication Dose Route Frequency Provider Last Rate Last Dose  . 0.9 %  sodium chloride infusion  250 mL Intravenous PRN Doreene Burke Kilroy, PA-C      . 0.9 %  sodium chloride infusion  250 mL Intravenous PRN Doreene Burke Kilroy, PA-C      . 0.9 %  sodium chloride infusion  1 mL/kg/hr Intravenous Continuous Luke K Kilroy, PA-C      . acetaminophen (TYLENOL) tablet 650 mg  650 mg Oral Q4H PRN Erlene Quan, PA-C      .  albuterol (PROVENTIL) (2.5 MG/3ML) 0.083% nebulizer solution 2.5 mg  2.5 mg Nebulization Q6H PRN Erlene Quan, PA-C      . ALPRAZolam Duanne Moron) tablet 0.25 mg  0.25 mg Oral BID PRN Erlene Quan, PA-C      . [START ON 06/15/2014] aspirin EC tablet 81 mg  81 mg Oral Daily Luke K Kilroy, PA-C      . fluticasone [facility]) 50 MCG/ACT nasal spray 2 spray  2 spray Each Nare Daily Luke K Kilroy, PA-C      . heparin ADULT infusion 100 units/mL (25000 units/250 mL)  1,000 Units/hr Intravenous Continuous Darnell Level Mancheril, RPH 10 mL/hr at 06/14/14 1822 1,000 Units/hr at 06/14/14 1822  . [START ON 06/15/2014] losartan (COZAAR) tablet 50 mg  50 mg Oral Daily Luke K Kilroy, PA-C      . [START ON 06/15/2014] metoprolol  succinate (TOPROL-XL) 24 hr tablet 25 mg  25 mg Oral Daily Luke K Kilroy, PA-C      . nitroGLYCERIN (NITROSTAT) SL tablet 0.4 mg  0.4 mg Sublingual Q5 Min x 3 PRN Luke K Kilroy, PA-C      . ondansetron [facility]) injection 4 mg  4 mg Intravenous Q6H PRN Erlene Quan, PA-C      . [START ON 06/15/2014] potassium chloride (KLOR-CON) packet 20 mEq  20 mEq Oral Daily Luke K Kilroy, PA-C      . sodium chloride 0.9 % injection 3 mL  3 mL Intravenous Q12H Luke K Kilroy, PA-C      . sodium chloride 0.9 % injection 3 mL  3 mL Intravenous PRN Doreene Burke Kilroy, PA-C      . sodium chloride 0.9 % injection 3 mL  3 mL Intravenous Q12H Luke K Kilroy, PA-C      . sodium chloride 0.9 % injection 3 mL  3 mL Intravenous PRN Luke K Kilroy, PA-C      . zolpidem (AMBIEN) tablet 5 mg  5 mg Oral QHS PRN Erlene Quan, PA-C         Family History  Problem Relation Age of Onset  . Colon cancer Paternal Uncle   . Colon polyps Sister   . Depression Sister     suicide  . Colon polyps Brother   . Colon polyps Mother   . Dementia Mother   . Heart disease Father 78    CHF  . Macular degeneration Father      History   Social History  . Marital Status: Married    Spouse Name: [name]    Number of Children: 2  . Years of Education: Bachelors   Occupational History  .  [place]   Social History Main Topics  . Smoking status: Never Smoker   . Smokeless tobacco: Never Used  . Alcohol Use: Yes     Comment: 01/20/12 once/month  . Drug Use: No  . Sexual Activity: Yes    Partners: Male   Other Topics Concern  . Not on file   Social History Narrative   Marital status: married x 28 years to [place]; happily married; no abuse      Children: 2 children (20 yo son, 44 yo daughter); no grandchildren      Lives with: husband, daughter      Employment:  [employer] 2009-2013.  Resigned 08/2012.  Bachelor degrees in communication.     Patient works as a Surveyor, minerals.   Patient drinks 2 caffeine beverages daily.   Patient is left  -handed.  Tobacco:  None      Alcohol:  Once weekly; wine      Drugs: none      Exercise:  none                 Review of Systems: General: negative for chills, fever, night sweats or weight changes.  Cardiovascular: negative for chest pain, dyspnea on exertion, edema, orthopnea, palpitations, paroxysmal nocturnal dyspnea or shortness of breath Dermatological: negative for rash Respiratory: negative for cough or wheezing Urologic: negative for hematuria Abdominal: negative for nausea, vomiting, diarrhea, bright red blood per rectum, melena, or hematemesis Neurologic: negative for visual changes, syncope, or dizziness She recently had her B/P medications adjusted. HCTZ was causing palpitations.  All other systems reviewed and are otherwise negative except as noted above.  Physical Exam: Blood pressure 135/84, pulse 64, temperature 98.2 F (36.8 C), temperature source Oral, resp. rate 14, height 5' 4.57" (1.64 m), weight 210 lb 1.6 oz (95.3 kg), SpO2 94.00%.  General appearance: alert, cooperative, no distress and moderately obese Neck: no carotid bruit and no JVD Lungs: clear to auscultation bilaterally Heart: regular rate and rhythm Abdomen: obese, non tender Extremities: no edema Pulses: 2/4 LE pulses, radial pulses difficult to palpate bilateraly.  Skin: cool, pale, dry Neurologic: Grossly normal    Labs:   Results for orders placed during the hospital encounter of 06/14/14 (from the past 24 hour(s))  CBC     Status: None   Collection Time    06/14/14  1:28 PM      Result Value Ref Range   WBC 4.6  4.0 - 10.5 K/uL   RBC 4.85  3.87 - 5.11 MIL/uL   Hemoglobin 13.7  12.0 - 15.0 g/dL   HCT 40.2  36.0 - 46.0 %   MCV 82.9  78.0 - 100.0 fL   MCH 28.2  26.0 - 34.0 pg   MCHC 34.1  30.0 - 36.0 g/dL   RDW 13.5  11.5 - 15.5 %   Platelets 230  150 - 400 K/uL  COMPREHENSIVE METABOLIC PANEL     Status: Abnormal   Collection Time    06/14/14  1:28 PM      Result Value  Ref Range   Sodium 140  137 - 147 mEq/L   Potassium 4.1  3.7 - 5.3 mEq/L   Chloride 107  96 - 112 mEq/L   CO2 21  19 - 32 mEq/L   Glucose, Bld 99  70 - 99 mg/dL   BUN 12  6 - 23 mg/dL   Creatinine, Ser 0.75  0.50 - 1.10 mg/dL   Calcium 8.7  8.4 - 10.5 mg/dL   Total Protein 7.2  6.0 - 8.3 g/dL   Albumin 3.6  3.5 - 5.2 g/dL   AST 28  0 - 37 U/L   ALT 36 (*) 0 - 35 U/L   Alkaline Phosphatase 75  39 - 117 U/L   Total Bilirubin <0.2 (*) 0.3 - 1.2 mg/dL   GFR calc non Af Amer >90  >90 mL/min   GFR calc Af Amer >90  >90 mL/min   Anion gap 12  5 - 15  I-STAT TROPOININ, ED     Status: None   Collection Time    06/14/14  2:29 PM      Result Value Ref Range   Troponin i, poc 0.00  0.00 - 0.08 ng/mL   Comment 3           TROPONIN I  Status: None   Collection Time    06/14/14  4:36 PM      Result Value Ref Range   Troponin I <0.30  <0.30 ng/mL     Radiology/Studies: No results found.  EKG:NSR without acute changes  ASSESSMENT AND PLAN:  Principal Problem:   Unstable angina Active Problems:   SJOGREN'S SYNDROME   ADD (attention deficit disorder)   SLE (systemic lupus erythematosus)   Palpitations   Essential hypertension, benign   OSA on CPAP   Obesity (BMI 30-39.9)   Arm pain, left- possible anginal pain   Family history of coronary artery disease in father   Left arm pain   PLAN: Will review with MD- she has a history of Lupus with vasculitis, ? Cath vs Myoview.    Henri Medal, PA-C 06/14/2014, 3:41 PM   I seen and examined the patient along with Mr. Rosalyn Gess, [place]. Agree with his findings and examination.  Essentially she is a middle-aged woman with a history of lupus and vasculitis who presents now with these 2 have not 3 episodes of severe left arm pain the most recent one prolonged diaphoresis. There are some subtle EKG differences between the EKG done in the outpatient clinic and here in the emergency room. These are nonspecific for ischemia but can be  potentially ischemic in nature. After the initial discussion we decided she was going to proceed with Myoview stress testing in the morning, however following a conversation with her primary physician she was convinced that perhaps the best choice would be to proceed with cardiac catheterization as opposed to noninvasive evaluation.  I am concerned that these episodes of left arm pain with possible EKG changes would be consistent with an unstable angina ACS picture. Unfortunately when she change her mind it was too late to proceed to the cath lab today. Will posterior for tomorrow morning in order to delineate her coronary anatomy.  She'll issue of heparin overnight when necessary nitroglycerin. We'll check serial troponins and monitor EKGs in the setting of chest discomfort. She is already on ARB and beta blocker which will continue as her blood pressure heart are stable.  She is on a statin due to myalgias. We will check her lipid profile and treat accordingly. May need to consider statin therapy depending on the results for cardiac catheterization.   I did review the catheterization procedure along with risks, benefits and alternatives/indications with the patient and her husband.    Risks / Complications include, but not limited to: Death, MI, CVA/TIA, VF/VT (with defibrillation), Bradycardia (need for temporary pacer placement), contrast induced nephropathy, bleeding / bruising / hematoma / pseudoaneurysm, vascular or coronary injury (with possible emergent CT or Vascular Surgery), adverse medication reactions, infection.    The patient and husband voice understanding and agree to proceed.      Leonie Man, M.D., M.S. Interventional Cardiologist   Pager # [phone] 06/14/2014

## 2014-06-14 NOTE — ED Provider Notes (Signed)
CSN: [id number]     Arrival date & time 06/14/14  1308 History   First MD Initiated Contact with Patient 06/14/14 1342   History provided by patient, husband at bedside.   Chief Complaint  Patient presents with  . Chest Pain   HPI  Patient presented today directly from Urgent Care via EMS for worsening Left Arm Pain, without radiation to chest or back, denies SOB. Admits to diaphoresis at Urgent Care. She stated that symptoms started yesterday evening 06/13/14, with left arm pain, intermittently painful from shoulder down to forearm, associated with left ear ache, sore throat, and heartburn. Prior to onset of pain she had been playing with her kids and admitted to some stress on arms but nothing that was acutely painful. She had called in to her primary doctor to get a refill of a prior Levaquin rx, as she thought that she may be getting sick. Recently, she had been in [place], [place] and had not felt well for a few days prior to onset of symptoms, but attributed this to the heat. Tried Tylenol and Motrin without relief.  At Urgent Care earlier today, initial concern was for myocardial ischemia with EKG changes of some lateral T-wave flattening V4 - V6, patient given ASA 325mg , NTG, oxygen, and Xanax for anxiety. Currently in ED, reports intermittent Left arm pain, overall improved. States left ear ache, sore throat, and heartburn are resolved. Admits to persistent nausea.  Past Medical History  Diagnosis Date  . Lupus   . Migraine   . Complication of anesthesia     Sjogrens Syndrome  . Sjogren's syndrome   . Vasculitis     "autoimmune"  . Diverticulitis   . Gastritis   . Duodenitis   . Pancreatitis   . Fibromyalgia   . Perforation bowel     "microperforation"  . Asthma   . Melanoma 2008    Back; [place].  . Depression   . GERD (gastroesophageal reflux disease)   . Hypertension   . Urticaria   . Obstructive sleep apnea 12/25/2012   Past Surgical History  Procedure Laterality Date  .  Melanoma excision    . Tmj arthroplasty  1970  . Appendectomy    . Cholecystectomy    . Pancreas surgery      sphincterotomy  . Abdominoplasty    . Liver biopsy    . Breast surgery    . Anterior cervical decomp/discectomy fusion  10/29/2011    Procedure: ANTERIOR CERVICAL DECOMPRESSION/DISCECTOMY FUSION 2 LEVELS;  Surgeon: Floyce Stakes;  Location: [facility];  Service: Neurosurgery;  Laterality: N/A;  Cervical five-six,Cervical six-seven nterior cervical decompression/diskectomy, fusion, plate  . Sigmoidectomy  03/2012    done at [facility]  . Abdominal hysterectomy      Partial; Ovaries intact.  DUB/fibroids.  . Colonoscopy  11/11/2008  . Sleep study  10/11/2012    +severe OSA.  [facility].  . Colonoscopy N/A 09/20/2013    Procedure: COLONOSCOPY;  Surgeon: Lafayette Dragon, MD;  Location: [facility];  Service: Endoscopy;  Laterality: N/A;   Family History  Problem Relation Age of Onset  . Colon cancer Paternal Uncle   . Colon polyps Sister   . Depression Sister     suicide  . Colon polyps Brother   . Colon polyps Mother   . Dementia Mother   . Heart disease Father 20    CHF  . Macular degeneration Father    History  Substance Use Topics  . Smoking status:  Never Smoker   . Smokeless tobacco: Never Used  . Alcohol Use: Yes     Comment: 01/20/12 once/month   OB History   Grav Para Term Preterm Abortions TAB SAB Ect Mult Living                 Review of Systems  See above HPI  Allergies  Meperidine hcl; Shrimp; Doxycycline; Latex; Promethazine hcl; Sulfonamide derivatives; and Tetracyclines & related  Home Medications   Prior to Admission medications   Medication Sig Start Date End Date Taking? Authorizing Provider  albuterol (PROVENTIL HFA;VENTOLIN HFA) 108 (90 BASE) MCG/ACT inhaler Inhale 2 puffs into the lungs every 6 (six) hours as needed. For shortness of breath. 04/29/14   Orma Flaming, MD  amphetamine-dextroamphetamine (ADDERALL) 10 MG tablet Take 10 mg  by mouth as needed. 08/16/13   Wardell Honour, MD  amphetamine-dextroamphetamine (ADDERALL) 10 MG tablet Take 1 tablet by mouth. As needed 08/16/13   Historical Provider, MD  estradiol (ESTRACE) 1 MG tablet Take 1 mg by mouth 2 (two) times a week. 05/31/13   Wardell Honour, MD  fluticasone (FLONASE) 50 MCG/ACT nasal spray Place 2 sprays into both nostrils daily. 04/29/14   Orma Flaming, MD  LORazepam (ATIVAN) 1 MG tablet Take 1 tablet (1 mg total) by mouth 2 (two) times daily as needed. For anxiety 01/19/14   Wardell Honour, MD  losartan (COZAAR) 50 MG tablet Take 1 tablet (50 mg total) by mouth daily. 05/31/14   Barton Fanny, MD  metoprolol succinate (TOPROL-XL) 25 MG 24 hr tablet Take 1 tablet (25 mg total) by mouth daily. 05/31/14   Barton Fanny, MD  potassium chloride (KLOR-CON) 20 MEQ packet Take 1/2 tablet twice daily prn. 06/07/14   Barton Fanny, MD  zolpidem (AMBIEN) 10 MG tablet Take 1 tablet (10 mg total) by mouth at bedtime as needed. For insomnia 04/29/14   Orma Flaming, MD   BP 135/84  Pulse 81  Temp(Src) 98.1 F (36.7 C) (Oral)  Resp 23  SpO2 99% Physical Exam  Gen - well-appearing, sitting up in bed, anxious, NAD HEENT - NCAT, PERRL, EOMI, oropharynx clear, mild dry MM Neck - supple Heart - RRR, no murmurs heard Lungs - CTAB. Normal work of breathing. Abd - soft, NTND, no masses, +active BS MSK / Ext - left forearm tenderness to palpation, FROM without discomfort, no edema Skin - warm, dry, no rashes Neuro - awake, alert, oriented, grossly non-focal, intact muscle strength 5/5 b/l, intact distal sensation to light touch  ED Course  Procedures (including critical care time) Labs Review Labs Reviewed  COMPREHENSIVE METABOLIC PANEL - Abnormal; Notable for the following:    ALT 36 (*)    Total Bilirubin <0.2 (*)    All other components within normal limits  CBC  I-STAT TROPOININ, ED    Imaging Review No results found.   EKG  Interpretation   Date/Time:  Tuesday June 14 2014 13:10:44 EDT Ventricular Rate:  72 PR Interval:  201 QRS Duration: 99 QT Interval:  452 QTC Calculation: 495 R Axis:   66 Text Interpretation:  Sinus rhythm No significant change since last  tracing Confirmed by STEINL  MD, Lennette Bihari ([id number]) on 06/14/2014 2:19:30 PM      MDM   Final diagnoses:  Left arm pain  Nausea   61 yr F with significant PMH SLE, Sjogren's syndrome, fibromyalgia, GERD, HTN who presented via EMS directly from Urgent Care with Left  arm pain, nausea / diaphoresis, and concern for ischemic changes on EKG (lateral V4-V6 T-wave flattening), s/p ASA, NTG, O2, Xanax. History suggestive of atypical presentation, without CP or SOB, intermittent worsening since last night. Initial assessment in ED with EKG (NSR, no acute ST-T wave changes, completely resolved V4-V6 T waves, from prior EKG), clinically well-appearing and in no apparent distress, left arm pain is intermittently improved.  Proceed with further work-up CMET, CBC, POCT-Troponin, CXR.  UPDATE @ 3007 - Improved pain, persistent nausea. Ordered Zofran IV x 1 dose. Labs with unremarkable CMET and CBC, POCT-Troponin (0.00), CXR without acute findings. Cardiology currently seeing patient. Follow-up recommendations / disposition.    Nobie Putnam, DO 06/14/14 [place], DO 06/14/14 1531

## 2014-06-14 NOTE — ED Notes (Signed)
Pt arrived via ems related to left arm pain since last pm with possible ekg changes. Pt given 325 asa, 4 zofran, .5 Ativan, Ntg sl, O2 Pt states diaphoretic at pmd office. Pain 2/10 at this time. Denies SOB

## 2014-06-14 NOTE — ED Notes (Signed)
Dr Delane Ginger aware of patient complaint of nausea

## 2014-06-14 NOTE — ED Notes (Signed)
Dr. Harding at the bedside. 

## 2014-06-15 ENCOUNTER — Encounter (HOSPITAL_COMMUNITY)
Admission: EM | Disposition: A | Discharge status: Home / Self Care | Payer: Self-pay | Source: Home / Self Care | Attending: Emergency Medicine

## 2014-06-15 ENCOUNTER — Encounter: Payer: Self-pay | Admitting: Internal Medicine

## 2014-06-15 DIAGNOSIS — R079 Chest pain, unspecified: Secondary | ICD-10-CM

## 2014-06-15 HISTORY — PX: ULTRASOUND GUIDANCE FOR VASCULAR ACCESS: SHX6516

## 2014-06-15 HISTORY — PX: LEFT HEART CATHETERIZATION WITH CORONARY ANGIOGRAM: SHX5451

## 2014-06-15 LAB — CBC
HCT: 38.4 % (ref 36.0–46.0)
Hemoglobin: 12.8 g/dL (ref 12.0–15.0)
MCH: 27.7 pg (ref 26.0–34.0)
MCHC: 33.3 g/dL (ref 30.0–36.0)
MCV: 83.1 fL (ref 78.0–100.0)
PLATELETS: 214 10*3/uL (ref 150–400)
RBC: 4.62 MIL/uL (ref 3.87–5.11)
RDW: 13.8 % (ref 11.5–15.5)
WBC: 4.4 10*3/uL (ref 4.0–10.5)

## 2014-06-15 LAB — BASIC METABOLIC PANEL
Anion gap: 12 (ref 5–15)
BUN: 11 mg/dL (ref 6–23)
CO2: 22 mEq/L (ref 19–32)
Calcium: 8.4 mg/dL (ref 8.4–10.5)
Chloride: 109 mEq/L (ref 96–112)
Creatinine, Ser: 0.89 mg/dL (ref 0.50–1.10)
GFR calc Af Amer: 84 mL/min — ABNORMAL LOW (ref >90)
GFR calc non Af Amer: 73 mL/min — ABNORMAL LOW (ref >90)
Glucose, Bld: 99 mg/dL (ref 70–99)
Potassium: 3.8 mEq/L (ref 3.7–5.3)
Sodium: 143 mEq/L (ref 137–147)

## 2014-06-15 LAB — LIPID PANEL
CHOLESTEROL: 149 mg/dL (ref 0–200)
HDL: 37 mg/dL — ABNORMAL LOW (ref >39)
LDL Cholesterol: 85 mg/dL (ref 0–99)
Total CHOL/HDL Ratio: 4 RATIO
Triglycerides: 135 mg/dL (ref <150)
VLDL: 27 mg/dL (ref 0–40)

## 2014-06-15 LAB — PROTIME-INR
INR: 0.99 (ref 0.00–1.49)
Prothrombin Time: 13.1 seconds (ref 11.6–15.2)

## 2014-06-15 LAB — HEPARIN LEVEL (UNFRACTIONATED): HEPARIN UNFRACTIONATED: 0.32 [IU]/mL (ref 0.30–0.70)

## 2014-06-15 SURGERY — LEFT HEART CATHETERIZATION WITH CORONARY ANGIOGRAM
Anesthesia: LOCAL

## 2014-06-15 MED ORDER — DIPHENHYDRAMINE HCL 50 MG/ML IJ SOLN
25.0000 mg | Freq: Once | INTRAMUSCULAR | Status: DC
  Filled 2014-06-15: qty 1

## 2014-06-15 MED ORDER — LIDOCAINE HCL (PF) 1 % IJ SOLN
INTRAMUSCULAR | Status: AC
  Filled 2014-06-15: qty 30

## 2014-06-15 MED ORDER — DIPHENHYDRAMINE HCL 50 MG/ML IJ SOLN
INTRAMUSCULAR | Status: AC
  Filled 2014-06-15: qty 1

## 2014-06-15 MED ORDER — HEPARIN SODIUM (PORCINE) 1000 UNIT/ML IJ SOLN
INTRAMUSCULAR | Status: AC
  Filled 2014-06-15: qty 1

## 2014-06-15 MED ORDER — NITROGLYCERIN 1 MG/10 ML FOR IR/CATH LAB
INTRA_ARTERIAL | Status: AC
  Filled 2014-06-15: qty 10

## 2014-06-15 MED ORDER — HEPARIN (PORCINE) IN NACL 2-0.9 UNIT/ML-% IJ SOLN
INTRAMUSCULAR | Status: AC
  Filled 2014-06-15: qty 1000

## 2014-06-15 MED ORDER — POTASSIUM CHLORIDE 20 MEQ/15ML (10%) PO LIQD
20.0000 meq | Freq: Every day | ORAL | Status: DC
  Administered 2014-06-15: 20 meq via ORAL
  Filled 2014-06-15: qty 15

## 2014-06-15 MED ORDER — SODIUM CHLORIDE 0.9 % IV SOLN: 1.0000 mL/kg/h | INTRAVENOUS | Status: AC

## 2014-06-15 MED ORDER — FENTANYL CITRATE 0.05 MG/ML IJ SOLN
INTRAMUSCULAR | Status: AC
  Filled 2014-06-15: qty 2

## 2014-06-15 MED ORDER — VERAPAMIL HCL 2.5 MG/ML IV SOLN
INTRAVENOUS | Status: AC
  Filled 2014-06-15: qty 2

## 2014-06-15 MED ORDER — MIDAZOLAM HCL 2 MG/2ML IJ SOLN
INTRAMUSCULAR | Status: AC
  Filled 2014-06-15: qty 2

## 2014-06-15 NOTE — Progress Notes (Signed)
ANTICOAGULATION CONSULT NOTE - Follow Up Consult  Pharmacy Consult for Heparin  Indication: chest pain/ACS  Allergies  Allergen Reactions  . Meperidine Hcl Other (See Comments)    tachycardia  . Shrimp [Shellfish Allergy] Shortness Of Breath  . Doxycycline Hives  . Latex Other (See Comments)    Breathing problems.  . Promethazine Hcl Other (See Comments)    "uncontrolled vomiting"  . Sulfonamide Derivatives Hives  . Tetracyclines & Related Rash    Patient Measurements: Height: 5' 4.57" (164 cm) Weight: 210 lb 1.6 oz (95.3 kg) IBW/kg (Calculated) : 56 Heparin Dosing Weight: 78 kg  Vital Signs: Temp: 98.1 F (36.7 C) (08/04 2014) Temp src: Oral (08/04 2014) BP: 126/65 mmHg (08/04 2014) Pulse Rate: 66 (08/04 2014)  Labs:  Recent Labs  06/14/14 1328 06/14/14 1636 06/14/14 2312  HGB 13.7  --   --   HCT 40.2  --   --   PLT 230  --   --   HEPARINUNFRC  --   --  0.39  CREATININE 0.75  --   --   TROPONINI  --  <0.30 <0.30    Estimated Creatinine Clearance: 92.1 ml/min (by C-G formula based on Cr of 0.75).   Medications:  Heparin 1000 units/hr  Assessment: Heparin for arm pain/concern for ACS, first HL is 0.39, other labs as above.   Goal of Therapy:  Heparin level 0.3-0.7 units/ml Monitor platelets by anticoagulation protocol: Yes   Plan:  -Continue heparin at 1000 units/hr -HL with AM labs -Daily CBC/HL -Monitor for bleeding  Narda Bonds 06/15/2014,12:18 AM

## 2014-06-15 NOTE — Progress Notes (Signed)
Reassessed pt R groin. Soft and nontender. Pt is ready to be [place] home with husband. Reports she understands all DC instructions and follow up appointments. Pt is not wanting to be assessed by fellow. Pt reports she understands all S/S of complications with cath site.   Prescilla Sours, Therapist, sports

## 2014-06-15 NOTE — ED Provider Notes (Signed)
Medical screening examination/treatment/procedure(s) were conducted as a shared visit with non-physician practitioner(s) and myself.  I personally evaluated the patient during the encounter.   EKG Interpretation   Date/Time:  Tuesday June 14 2014 13:10:44 EDT Ventricular Rate:  72 PR Interval:  201 QRS Duration: 99 QT Interval:  452 QTC Calculation: 495 R Axis:   66 Text Interpretation:  Sinus rhythm No significant change since last  tracing Confirmed by Aquila Menzie  MD, Lennette Bihari ([id number]) on 06/14/2014 2:19:30 PM      Pt w c/o left forearm discomfort that began yesterday.  No cp. No sob. In room, pt repetitively flexing and extending fingers of left hand. Radial pulse 2+. No sts. No skin changes or erythema.  Was sent from urgent care for chest pain eval due to ?mild flattening t waves laterally, however in ED ecg unremarkable, and no change in symptoms between urgent care and ED eval (?mild change in placement leads from uc ecg).  Labs.  Initial plan for d/c w outpt follow up. Pt states urgent care provider had contacted cardiology who was expecting her - card called, will see in ED.    Mirna Mires, MD 06/15/14 [phone]

## 2014-06-15 NOTE — CV Procedure (Signed)
   Cardiac Catheterization Procedure Note  Name: Abigail [name] MRN: [id number] DOB: [birth year]  Procedure: Left Heart Cath, Selective Coronary Angiography, LV angiography  Indication: 53 yo WF presents with symptoms of chest pain and T wave abnormality on Ecg.   Procedural details: Initially right radial access was attempted. The pulse was poor and ultrasound demonstrated a tiny radial artery that was not large enough for cannulation. The right groin was prepped, draped, and anesthetized with 1% lidocaine. Using modified Seldinger technique, a 5 French sheath was introduced into the right femoral artery. Standard Judkins catheters were used for coronary angiography and left ventriculography. Catheter exchanges were performed over a guidewire. There were no immediate procedural complications. The patient was transferred to the post catheterization recovery area for further monitoring.  Procedural Findings: Hemodynamics:  AO 161/92 mean 122 LV 158/11 mm Hg   Coronary angiography: Coronary dominance: right  Left mainstem: Normal  Left anterior descending (LAD): Normal  Left circumflex (LCx): Normal  Right coronary artery (RCA): Normal  Left ventriculography: Left ventricular systolic function is normal, LVEF is estimated at 55-65%, there is no significant mitral regurgitation   Final Conclusions:   1. Normal coronary anatomy 2. Normal LV function.   Recommendations: Consider noncardiac causes of chest pain.  Peter Martinique, [place] 06/15/2014, 1:56 PM

## 2014-06-15 NOTE — Progress Notes (Signed)
Checked patients groin right before going over discharge paperwork. Pt c/o right groin tenderness. Small  Dollar size lump felt. Held pressure for 5 min. Groin site is soft and absent of lump. Called Meda Klinefelter, [place]. Hold patients discharge for 2 hours fellow to assess groin for discharge. Will continue to monitor.

## 2014-06-15 NOTE — Interval H&P Note (Signed)
History and Physical Interval Note:  06/15/2014 1:10 PM  Abigail [name]  has presented today for surgery, with the diagnosis of c/p  The various methods of treatment have been discussed with the patient and family. After consideration of risks, benefits and other options for treatment, the patient has consented to  Procedure(s): LEFT HEART CATHETERIZATION WITH CORONARY ANGIOGRAM (N/A) as a surgical intervention .  The patient's history has been reviewed, patient examined, no change in status, stable for surgery.  I have reviewed the patient's chart and labs.  Questions were answered to the patient's satisfaction.   Cath Lab Visit (complete for each Cath Lab visit)  Clinical Evaluation Leading to the Procedure:   ACS: Yes.    Non-ACS:    Anginal Classification: CCS IV  Anti-ischemic medical therapy: Minimal Therapy (1 class of medications)  Non-Invasive Test Results: No non-invasive testing performed  Prior CABG: No previous CABG        Collier [facility] 06/15/2014 1:10 PM

## 2014-06-15 NOTE — Discharge Summary (Signed)
CARDIOLOGY DISCHARGE SUMMARY   Patient ID: Abigail [name] MRN: [id number] DOB/AGE: 06/25/1961 53 y.o.  Admit date: 06/14/2014 Discharge date: 06/17/2014  PCP: Ellsworth Lennox, MD Primary Cardiologist: Dr. Ellyn Hack   Primary Discharge Diagnosis:    Unstable angina  Secondary Discharge Diagnosis:    SJOGREN'S SYNDROME   ADD (attention deficit disorder)   SLE (systemic lupus erythematosus)   Palpitations   Essential hypertension, benign   OSA on CPAP   Obesity (BMI 30-39.9)   Arm pain, left- possible anginal pain   Family history of coronary artery disease in father  Procedures: Left Heart Cath, Selective Coronary Angiography, LV angiography, 2 view CXR   Hospital Course: Abigail [name] is a 53 y.o. female with no history of CAD. She developed left arm and chest pain, concerning for unstable angina. She was seen at [facility] and sent to the [facility] ER, where she was admitted for further evaluation and treatment.  Her cardiac enzymes were negative for MI. Her symptoms were concerning for unstable angina, so a cardiac catheterization was performed, to further define her anatomy.   The catheterization results are below, she had no CAD and her EF is preserved.   Post-cath, she is having no chest pain or SOB. She had a small hematoma in her right groin and that resolved with direct pressure. There was some ecchymosis area but no significant pain and no hematoma so no further workup was indicated. She is ambulating without symptoms and considered stable for discharge, to follow up as an outpatient with primary care, and with cardiology as needed.  Labs:  Lab Results  Component Value Date   WBC 4.4 06/15/2014   HGB 12.8 06/15/2014   HCT 38.4 06/15/2014   MCV 83.1 06/15/2014   PLT 214 06/15/2014     Recent Labs Lab 06/14/14 1328 06/15/14 0404  NA 140 143  K 4.1 3.8  CL 107 109  CO2 21 22  BUN 12 11  CREATININE 0.75 0.89  CALCIUM 8.7 8.4  PROT 7.2  --   BILITOT <0.2*   --   ALKPHOS 75  --   ALT 36*  --   AST 28  --   GLUCOSE 99 99    Recent Labs  06/14/14 1636 06/14/14 2312  TROPONINI <0.30 <0.30   Lipid Panel     Component Value Date/Time   CHOL 149 06/15/2014 0404   TRIG 135 06/15/2014 0404   HDL 37* 06/15/2014 0404   CHOLHDL 4.0 06/15/2014 0404   VLDL 27 06/15/2014 0404   LDLCALC 85 06/15/2014 0404    Recent Labs  06/15/14 0404  INR 0.99      Radiology: Dg Chest 2 View 06/14/2014   CLINICAL DATA:  Left arm pain  EXAM: CHEST  2 VIEW  COMPARISON:  11/14/2012  FINDINGS: Cardiomediastinal silhouette is stable. Metallic fixation plate cervical spine again noted. No acute infiltrate or pleural effusion. No pulmonary edema.  IMPRESSION: No active cardiopulmonary disease.   Electronically Signed   By: Lahoma Crocker M.D.   On: 06/14/2014 14:30   Cardiac Cath: 06/15/2014 Left mainstem: Normal  Left anterior descending (LAD): Normal  Left circumflex (LCx): Normal  Right coronary artery (RCA): Normal  Left ventriculography: Left ventricular systolic function is normal, LVEF is estimated at 55-65%, there is no significant mitral regurgitation  Final Conclusions:  1. Normal coronary anatomy  2. Normal LV function.  Recommendations: Consider noncardiac causes of chest pain.  EKG: 06/14/2014 SR, no acute ischemic  changes Vent. rate 72 BPM PR interval 201 ms QRS duration 99 ms QT/QTc 452/495 ms P-R-T axes 61 66 78  FOLLOW UP PLANS AND APPOINTMENTS Allergies  Allergen Reactions  . Meperidine Hcl Other (See Comments)    tachycardia  . Shrimp [Shellfish Allergy] Shortness Of Breath  . Doxycycline Hives  . Latex Other (See Comments)    Breathing problems.  . Promethazine Hcl Other (See Comments)    "uncontrolled vomiting"  . Sulfonamide Derivatives Hives  . Tetracyclines & Related Rash     Medication List         albuterol 108 (90 BASE) MCG/ACT inhaler  Commonly known as:  PROVENTIL HFA;VENTOLIN HFA  Inhale 2 puffs into the lungs every 6 (six)  hours as needed for shortness of breath. For shortness of breath.     amphetamine-dextroamphetamine 10 MG tablet  Commonly known as:  ADDERALL  Take 10 mg by mouth daily as needed.     estradiol 1 MG tablet  Commonly known as:  ESTRACE  Take 1 mg by mouth 2 (two) times a week.     fluticasone 50 MCG/ACT nasal spray  Commonly known as:  FLONASE  Place 2 sprays into both nostrils daily.     LORazepam 1 MG tablet  Commonly known as:  ATIVAN  Take 1 tablet (1 mg total) by mouth 2 (two) times daily as needed. For anxiety     losartan 50 MG tablet  Commonly known as:  COZAAR  Take 1 tablet (50 mg total) by mouth daily.     metoprolol succinate 25 MG 24 hr tablet  Commonly known as:  TOPROL-XL  Take 1 tablet (25 mg total) by mouth daily.     potassium chloride 20 MEQ packet  Commonly known as:  KLOR-CON  Take 20 mEq by mouth daily as needed. Take when potassium is low     zolpidem 10 MG tablet  Commonly known as:  AMBIEN  Take 5 mg by mouth at bedtime as needed. For insomnia         Follow-up Information   Schedule an appointment as soon as possible for a visit with Ellsworth Lennox, MD.   Specialty:  Family Medicine   Contact information:   [place] [address] [phone]       Follow up with Leonie Man, MD. (As needed)    Specialty:  Cardiology   Contact information:   [place] [address] [phone]       BRING ALL MEDICATIONS WITH YOU TO FOLLOW UP APPOINTMENTS  Time spent with patient to include physician time: 48 min Signed: Rosaria Ferries, PA-C 06/17/2014, 11:53 AM Co-Sign MD

## 2014-06-15 NOTE — Progress Notes (Signed)
ANTICOAGULATION CONSULT NOTE - Follow Up Consult  Pharmacy Consult for Heparin  Indication: chest pain/ACS  Allergies  Allergen Reactions  . Meperidine Hcl Other (See Comments)    tachycardia  . Shrimp [Shellfish Allergy] Shortness Of Breath  . Doxycycline Hives  . Latex Other (See Comments)    Breathing problems.  . Promethazine Hcl Other (See Comments)    "uncontrolled vomiting"  . Sulfonamide Derivatives Hives  . Tetracyclines & Related Rash    Patient Measurements: Height: 5' 4.57" (164 cm) Weight: 213 lb 12.8 oz (96.979 kg) IBW/kg (Calculated) : 56 Heparin Dosing Weight: 78 kg  Vital Signs: Temp: 97.4 F (36.3 C) (08/05 0504) Temp src: Oral (08/05 0504) BP: 131/80 mmHg (08/05 0504) Pulse Rate: 54 (08/05 0504)  Labs:  Recent Labs  06/14/14 1328 06/14/14 1636 06/14/14 2312 06/15/14 0404  HGB 13.7  --   --  12.8  HCT 40.2  --   --  38.4  PLT 230  --   --  214  LABPROT  --   --   --  13.1  INR  --   --   --  0.99  HEPARINUNFRC  --   --  0.39 0.32  CREATININE 0.75  --   --  0.89  TROPONINI  --  <0.30 <0.30  --     Estimated Creatinine Clearance: 83.6 ml/min (by C-G formula based on Cr of 0.89).   Medications:  Heparin 1000 units/hr  Assessment: 53 yo female on IV heparin for r/o ACS.  Planning to go to cath lab today.  Heparin level is therapeutic on 1000 units/hr.  No bleeding or complications noted.  CBC/ Pltc stable.  Goal of Therapy:  Heparin level 0.3-0.7 units/ml Monitor platelets by anticoagulation protocol: Yes   Plan:  -Continue heparin at 1000 units/hr -Daily CBC/HL -Monitor for bleeding -F/u plans for anticoagulation after cath lab today  Uvaldo Rising, BCPS  Clinical Pharmacist Pager [phone]  06/15/2014 8:54 AM

## 2014-06-15 NOTE — H&P (View-Only) (Signed)
   Patient Name: Abigail [name] Date of Encounter: 06/15/2014  Principal Problem:   Unstable angina Active Problems:   SJOGREN'S SYNDROME   ADD (attention deficit disorder)   SLE (systemic lupus erythematosus)   Palpitations   Essential hypertension, benign   OSA on CPAP   Obesity (BMI 30-39.9)   Arm pain, left- possible anginal pain   Family history of coronary artery disease in father   Left arm pain    Patient Profile: 52 yo female w/ hx HTN, lupus, Sjogren's, vasculitis, fibromyalgia, asthma, but no previous CAD was admitted 08/04 with arm/chest pain.  SUBJECTIVE: Pain is greatly improved. Still with some residual soreness left arm.  OBJECTIVE Filed Vitals:   06/14/14 1630 06/14/14 1657 06/14/14 2014 06/15/14 0504  BP: 114/63 135/84 126/65 131/80  Pulse: 66 64 66 54  Temp:  98.2 F (36.8 C) 98.1 F (36.7 C) 97.4 F (36.3 C)  TempSrc:  Oral Oral Oral  Resp: 12 14 15 15  Height:      Weight:    213 lb 12.8 oz (96.979 kg)  SpO2: 97% 94% 96% 98%    Intake/Output Summary (Last 24 hours) at 06/15/14 0843 Last data filed at 06/14/14 2108  Gross per 24 hour  Intake    240 ml  Output      0 ml  Net    240 ml   Filed Weights   06/14/14 1538 06/15/14 0504  Weight: 210 lb 1.6 oz (95.3 kg) 213 lb 12.8 oz (96.979 kg)    PHYSICAL EXAM General: Well developed, well nourished, female in no acute distress. Head: Normocephalic, atraumatic.  Neck: Supple without bruits, JVD not elevated Lungs:  Resp regular and unlabored, CTA. Heart: RRR, S1, S2, no S3, S4, or murmur; no rub. Abdomen: Soft, non-tender, non-distended, BS + x 4.  Extremities: No clubbing, cyanosis, no edema.  Neuro: Alert and oriented X 3. Moves all extremities spontaneously. Psych: Normal affect.  LABS: CBC: Recent Labs  06/14/14 1328 06/15/14 0404  WBC 4.6 4.4  HGB 13.7 12.8  HCT 40.2 38.4  MCV 82.9 83.1  PLT 230 214   INR: Recent Labs  06/15/14 0404  INR 0.99   Basic Metabolic  Panel: Recent Labs  06/14/14 1328 06/15/14 0404  NA 140 143  K 4.1 3.8  CL 107 109  CO2 21 22  GLUCOSE 99 99  BUN 12 11  CREATININE 0.75 0.89  CALCIUM 8.7 8.4   Liver Function Tests: Recent Labs  06/14/14 1328  AST 28  ALT 36*  ALKPHOS 75  BILITOT <0.2*  PROT 7.2  ALBUMIN 3.6   Cardiac Enzymes: Recent Labs  06/14/14 1636 06/14/14 2312  TROPONINI <0.30 <0.30    Recent Labs  06/14/14 1429  TROPIPOC 0.00   Fasting Lipid Panel: Recent Labs  06/15/14 0404  CHOL 149  HDL 37*  LDLCALC 85  TRIG 135  CHOLHDL 4.0   TELE:  Sinus rhythm, no significant ectopy  Radiology/Studies: Dg Chest 2 View 06/14/2014   CLINICAL DATA:  Left arm pain  EXAM: CHEST  2 VIEW  COMPARISON:  11/14/2012  FINDINGS: Cardiomediastinal silhouette is stable. Metallic fixation plate cervical spine again noted. No acute infiltrate or pleural effusion. No pulmonary edema.  IMPRESSION: No active cardiopulmonary disease.   Electronically Signed   By: Liviu  Pop M.D.   On: 06/14/2014 14:30     Current Medications:  . aspirin EC  81 mg Oral Daily  . fluticasone  2 spray Each   Nare Daily  . losartan  50 mg Oral Daily  . metoprolol succinate  25 mg Oral Daily  . potassium chloride  20 mEq Oral Daily  . sodium chloride  3 mL Intravenous Q12H  . sodium chloride  3 mL Intravenous Q12H   . sodium chloride 1 mL/kg/hr (06/15/14 0650)  . heparin 1,000 Units/hr (06/14/14 1822)    ASSESSMENT AND PLAN: Principal Problem:   Unstable angina - cardiac enzymes negative for MI, Today to further define her anatomy.  Previously her ESR was greater than 100, but improved on Remicade. Consider checking a sedimentation rate to further evaluate her symptoms if cath is negative. Patient has an allergy to shrimp, we'll give Benadryl precath. Active Problems:   SJOGREN'S SYNDROME   ADD (attention deficit disorder)   SLE (systemic lupus erythematosus)   Palpitations   Essential hypertension, benign   OSA on  CPAP   Obesity (BMI 30-39.9)   Arm pain, left- possible anginal pain   Family history of coronary artery disease in father   Signed, Rhonda Barrett , PA-C 8:43 AM 06/15/2014   Patient seen and examined. Agree with assessment and plan. No recurrent chest pain. Had severe left arm pain. No defintive ECG changes of ischemia. Discussed definitive cath; plan later today.   Thomas A. Kelly, MD, FACC 06/15/2014 9:08 AM  

## 2014-06-15 NOTE — Discharge Instructions (Signed)
You were evaluated in the Emergency Department for Left Arm Pain, after being sent here from Urgent Care for further work-up with concern for heart injury. Your initial EKG at Urgent Care demonstrated some lateral ischemic changes, but not confirmatory of heart injury. The repeat EKG here in the ED was entirely normal, and showed that the changes were resolved. Our testing and lab work did not find any abnormalities. We contacted the Cardiology Team and they have evaluated you. Please follow-up with your regular doctor within 2-3 days.  If you develop worsening symptoms with chest pain, shortness of breath, nausea / vomiting, worsening left arm pain, headache / lightheadedness, or any urgent concern, please call your regular doctor, and seek immediate medical attention, may return to the Emergency Department for repeat evaluation.  PLEASE REMEMBER TO BRING ALL OF YOUR MEDICATIONS TO EACH OF YOUR FOLLOW-UP OFFICE VISITS.  PLEASE ATTEND ALL SCHEDULED FOLLOW-UP APPOINTMENTS.   Activity: Increase activity slowly as tolerated. You may shower, but no soaking baths (or swimming) for 1 week. No driving for 2 days. No lifting over 5 lbs for 1 week. No sexual activity for 1 week.   You May Return to Work: in 1 week (if applicable)  Wound Care: You may wash cath site gently with soap and water. Keep cath site clean and dry. If you notice pain, swelling, bleeding or pus at your cath site, please call [phone].    Cardiac Cath Site Care Refer to this sheet in the next few weeks. These instructions provide you with information on caring for yourself after your procedure. Your caregiver may also give you more specific instructions. Your treatment has been planned according to current medical practices, but problems sometimes occur. Call your caregiver if you have any problems or questions after your procedure. HOME CARE INSTRUCTIONS  You may shower 24 hours after the procedure. Remove the bandage (dressing)  and gently wash the site with plain soap and water. Gently pat the site dry.   Do not apply powder or lotion to the site.   Do not sit in a bathtub, swimming pool, or whirlpool for 5 to 7 days.   No bending, squatting, or lifting anything over 10 pounds (4.5 kg) as directed by your caregiver.   Inspect the site at least twice daily.   Do not drive home if you are discharged the same day of the procedure. Have someone else drive you.   You may drive 24 hours after the procedure unless otherwise instructed by your caregiver.  What to expect:  Any bruising will usually fade within 1 to 2 weeks.   Blood that collects in the tissue (hematoma) may be painful to the touch. It should usually decrease in size and tenderness within 1 to 2 weeks.  SEEK IMMEDIATE MEDICAL CARE IF:  You have unusual pain at the site or down the affected limb.   You have redness, warmth, swelling, or pain at the site.   You have drainage (other than a small amount of blood on the dressing).   You have chills.   You have a fever or persistent symptoms for more than 72 hours.   You have a fever and your symptoms suddenly get worse.   Your leg becomes pale, cool, tingly, or numb.   You have heavy bleeding from the site. Hold pressure on the site.  Document Released: 11/30/2010 Document Revised: 10/17/2011 Document Reviewed: 11/30/2010 [facility] Patient Information 2012 [place].

## 2014-06-15 NOTE — Progress Notes (Signed)
   Patient Name: Abigail [name] Date of Encounter: 06/15/2014  Principal Problem:   Unstable angina Active Problems:   SJOGREN'S SYNDROME   ADD (attention deficit disorder)   SLE (systemic lupus erythematosus)   Palpitations   Essential hypertension, benign   OSA on CPAP   Obesity (BMI 30-39.9)   Arm pain, left- possible anginal pain   Family history of coronary artery disease in father   Left arm pain    Patient Profile: 53 yo female w/ hx HTN, lupus, Sjogren's, vasculitis, fibromyalgia, asthma, but no previous CAD was admitted 08/04 with arm/chest pain.  SUBJECTIVE: Pain is greatly improved. Still with some residual soreness left arm.  OBJECTIVE Filed Vitals:   06/14/14 1630 06/14/14 1657 06/14/14 2014 06/15/14 0504  BP: 114/63 135/84 126/65 131/80  Pulse: 66 64 66 54  Temp:  98.2 F (36.8 C) 98.1 F (36.7 C) 97.4 F (36.3 C)  TempSrc:  Oral Oral Oral  Resp: 12 14 15 15  Height:      Weight:    213 lb 12.8 oz (96.979 kg)  SpO2: 97% 94% 96% 98%    Intake/Output Summary (Last 24 hours) at 06/15/14 0843 Last data filed at 06/14/14 2108  Gross per 24 hour  Intake    240 ml  Output      0 ml  Net    240 ml   Filed Weights   06/14/14 1538 06/15/14 0504  Weight: 210 lb 1.6 oz (95.3 kg) 213 lb 12.8 oz (96.979 kg)    PHYSICAL EXAM General: Well developed, well nourished, female in no acute distress. Head: Normocephalic, atraumatic.  Neck: Supple without bruits, JVD not elevated Lungs:  Resp regular and unlabored, CTA. Heart: RRR, S1, S2, no S3, S4, or murmur; no rub. Abdomen: Soft, non-tender, non-distended, BS + x 4.  Extremities: No clubbing, cyanosis, no edema.  Neuro: Alert and oriented X 3. Moves all extremities spontaneously. Psych: Normal affect.  LABS: CBC: Recent Labs  06/14/14 1328 06/15/14 0404  WBC 4.6 4.4  HGB 13.7 12.8  HCT 40.2 38.4  MCV 82.9 83.1  PLT 230 214   INR: Recent Labs  06/15/14 0404  INR 0.99   Basic Metabolic  Panel: Recent Labs  06/14/14 1328 06/15/14 0404  NA 140 143  K 4.1 3.8  CL 107 109  CO2 21 22  GLUCOSE 99 99  BUN 12 11  CREATININE 0.75 0.89  CALCIUM 8.7 8.4   Liver Function Tests: Recent Labs  06/14/14 1328  AST 28  ALT 36*  ALKPHOS 75  BILITOT <0.2*  PROT 7.2  ALBUMIN 3.6   Cardiac Enzymes: Recent Labs  06/14/14 1636 06/14/14 2312  TROPONINI <0.30 <0.30    Recent Labs  06/14/14 1429  TROPIPOC 0.00   Fasting Lipid Panel: Recent Labs  06/15/14 0404  CHOL 149  HDL 37*  LDLCALC 85  TRIG 135  CHOLHDL 4.0   TELE:  Sinus rhythm, no significant ectopy  Radiology/Studies: Dg Chest 2 View 06/14/2014   CLINICAL DATA:  Left arm pain  EXAM: CHEST  2 VIEW  COMPARISON:  11/14/2012  FINDINGS: Cardiomediastinal silhouette is stable. Metallic fixation plate cervical spine again noted. No acute infiltrate or pleural effusion. No pulmonary edema.  IMPRESSION: No active cardiopulmonary disease.   Electronically Signed   By: Liviu  Pop M.D.   On: 06/14/2014 14:30     Current Medications:  . aspirin EC  81 mg Oral Daily  . fluticasone  2 spray Each   Nare Daily  . losartan  50 mg Oral Daily  . metoprolol succinate  25 mg Oral Daily  . potassium chloride  20 mEq Oral Daily  . sodium chloride  3 mL Intravenous Q12H  . sodium chloride  3 mL Intravenous Q12H   . sodium chloride 1 mL/kg/hr (06/15/14 0650)  . heparin 1,000 Units/hr (06/14/14 1822)    ASSESSMENT AND PLAN: Principal Problem:   Unstable angina - cardiac enzymes negative for MI, Today to further define her anatomy.  Previously her ESR was greater than 100, but improved on Remicade. Consider checking a sedimentation rate to further evaluate her symptoms if cath is negative. Patient has an allergy to shrimp, we'll give Benadryl precath. Active Problems:   SJOGREN'S SYNDROME   ADD (attention deficit disorder)   SLE (systemic lupus erythematosus)   Palpitations   Essential hypertension, benign   OSA on  CPAP   Obesity (BMI 30-39.9)   Arm pain, left- possible anginal pain   Family history of coronary artery disease in father   Signed, Rhonda Barrett , PA-C 8:43 AM 06/15/2014   Patient seen and examined. Agree with assessment and plan. No recurrent chest pain. Had severe left arm pain. No defintive ECG changes of ischemia. Discussed definitive cath; plan later today.    A. , MD, FACC 06/15/2014 9:08 AM  

## 2014-06-15 NOTE — Progress Notes (Signed)
UR completed 

## 2014-06-15 NOTE — Progress Notes (Signed)
Site area: rt groin femoral arterial sheath Site Prior to Removal:  Level 0 Pressure Applied For: 20 Manual:   yes Patient Status During Pull:  stable Post Pull Site:  Level 0 Post Pull Instructions Given:  yes Post Pull Pulses Present: yes Dressing Applied:  yes Bedrest begins @ 1430 Comments: no complications

## 2014-06-17 NOTE — Discharge Summary (Signed)
Patient seen and examined and history reviewed. Agree with above findings and plan. See prior cath note.  Abigail [name], [place] 06/17/2014 12:23 PM

## 2014-07-05 ENCOUNTER — Ambulatory Visit (INDEPENDENT_AMBULATORY_CARE_PROVIDER_SITE_OTHER): Payer: Medicare Other | Admitting: Family Medicine

## 2014-07-05 ENCOUNTER — Encounter: Payer: Self-pay | Admitting: Family Medicine

## 2014-07-05 VITALS — BP 146/98 | HR 90 | Temp 98.3°F | Resp 16 | Ht 65.0 in | Wt 210.4 lb

## 2014-07-05 DIAGNOSIS — T50905A Adverse effect of unspecified drugs, medicaments and biological substances, initial encounter: Secondary | ICD-10-CM

## 2014-07-05 DIAGNOSIS — M5030 Other cervical disc degeneration, unspecified cervical region: Secondary | ICD-10-CM | POA: Insufficient documentation

## 2014-07-05 DIAGNOSIS — R0789 Other chest pain: Secondary | ICD-10-CM

## 2014-07-05 DIAGNOSIS — N3010 Interstitial cystitis (chronic) without hematuria: Secondary | ICD-10-CM

## 2014-07-05 DIAGNOSIS — R350 Frequency of micturition: Secondary | ICD-10-CM

## 2014-07-05 LAB — POCT URINALYSIS DIPSTICK
Bilirubin, UA: NEGATIVE
GLUCOSE UA: NEGATIVE
Ketones, UA: NEGATIVE
LEUKOCYTES UA: NEGATIVE
NITRITE UA: NEGATIVE
Protein, UA: NEGATIVE
Spec Grav, UA: 1.01
Urobilinogen, UA: 0.2
pH, UA: 6.5

## 2014-07-05 LAB — POCT UA - MICROSCOPIC ONLY
CASTS, UR, LPF, POC: NEGATIVE
Crystals, Ur, HPF, POC: NEGATIVE
Epithelial cells, urine per micros: NEGATIVE
MUCUS UA: NEGATIVE
WBC, UR, HPF, POC: NEGATIVE
Yeast, UA: NEGATIVE

## 2014-07-05 MED ORDER — AMOXICILLIN 500 MG PO CAPS: 500.0000 mg | ORAL_CAPSULE | Freq: Two times a day (BID) | ORAL | Status: DC

## 2014-07-05 MED ORDER — VERAPAMIL HCL ER 120 MG PO CP24: 120.0000 mg | ORAL_CAPSULE | Freq: Every day | ORAL | Status: DC

## 2014-07-05 NOTE — Patient Instructions (Addendum)
You have a Low HDL cholesterol- below is some information about MEDITERRANEAN DIET which is heart- healthy lifestyle. This will help increase HDL and improve your overall health.       Mediterranean Diet  Why follow it? Research shows.   Those who follow the Mediterranean diet have a reduced risk of heart disease    The diet is associated with a reduced incidence of Parkinson's and Alzheimer's diseases   People following the diet may have longer life expectancies and lower rates of chronic diseases    The Dietary Guidelines for Americans recommends the Mediterranean diet as an eating plan to promote health and prevent disease  What Is the Mediterranean Diet?    Healthy eating plan based on typical foods and recipes of Mediterranean-style cooking   The diet is primarily a plant based diet; these foods should make up a majority of meals   Starches - Plant based foods should make up a majority of meals - They are an important sources of vitamins, minerals, energy, antioxidants, and fiber - Choose whole grains, foods high in fiber and minimally processed items  - Typical grain sources include wheat, oats, barley, corn, brown rice, bulgar, farro, millet, polenta, couscous  - Various types of beans include chickpeas, lentils, fava beans, black beans, white beans   Fruits  Veggies - Large quantities of antioxidant rich fruits & veggies; 6 or more servings  - Vegetables can be eaten raw or lightly drizzled with oil and cooked  - Vegetables common to the traditional Mediterranean Diet include: artichokes, arugula, beets, broccoli, brussel sprouts, cabbage, carrots, celery, collard greens, cucumbers, eggplant, kale, leeks, lemons, lettuce, mushrooms, okra, onions, peas, peppers, potatoes, pumpkin, radishes, rutabaga, shallots, spinach, sweet potatoes, turnips, zucchini - Fruits common to the Mediterranean Diet include: apples, apricots, avocados, cherries, clementines, dates, figs, grapefruits, grapes,  melons, nectarines, oranges, peaches, pears, pomegranates, strawberries, tangerines  Fats - Replace butter and margarine with healthy oils, such as olive oil, canola oil, and tahini  - Limit nuts to no more than a handful a day  - Nuts include walnuts, almonds, pecans, pistachios, pine nuts  - Limit or avoid candied, honey roasted or heavily salted nuts - Olives are central to the [employer] - can be eaten whole or used in a variety of dishes   Meats Protein - Limiting red meat: no more than a few times a month - When eating red meat: choose lean cuts and keep the portion to the size of deck of cards - Eggs: approx. 0 to 4 times a week  - Fish and lean poultry: at least 2 a week  - Healthy protein sources include, chicken, [place], lean beef, lamb - Increase intake of seafood such as tuna, salmon, trout, mackerel, shrimp, scallops - Avoid or limit high fat processed meats such as sausage and bacon  Dairy - Include moderate amounts of low fat dairy products  - Focus on healthy dairy such as fat free yogurt, skim milk, low or reduced fat cheese - Limit dairy products higher in fat such as whole or 2% milk, cheese, ice cream  Alcohol - Moderate amounts of red wine is ok  - No more than 5 oz daily for women (all ages) and men older than age 82  - No more than 10 oz of wine daily for men younger than 51  Other - Limit sweets and other desserts  - Use herbs and spices instead of salt to flavor foods  - Herbs and spices  common to the traditional Mediterranean Diet include: basil, bay leaves, chives, cloves, cumin, fennel, garlic, lavender, marjoram, mint, oregano, parsley, pepper, rosemary, sage, savory, sumac, tarragon, thyme   It's not just a diet, it's a lifestyle:    The Mediterranean diet includes lifestyle factors typical of those in the region    Foods, drinks and meals are best eaten with others and savored   Daily physical activity is important for overall good health   This could  be strenuous exercise like running and aerobics   This could also be more leisurely activities such as walking, housework, yard-work, or taking the stairs   Moderation is the key; a balanced and healthy diet accommodates most foods and drinks   Consider portion sizes and frequency of consumption of certain foods   Meal Ideas & Options:    Breakfast:  o Whole wheat toast or whole wheat English muffins with peanut butter & hard boiled egg o Steel cut oats topped with apples & cinnamon and skim milk  o Fresh fruit: banana, strawberries, melon, berries, peaches  o Smoothies: strawberries, bananas, greek yogurt, peanut butter o Low fat greek yogurt with blueberries and granola  o Egg white omelet with spinach and mushrooms o Breakfast couscous: whole wheat couscous, apricots, skim milk, cranberries    Sandwiches:  o Hummus and grilled vegetables (peppers, zucchini, squash) on whole wheat bread   o Grilled chicken on whole wheat pita with lettuce, tomatoes, cucumbers or tzatziki  o Tuna salad on whole wheat bread: tuna salad made with greek yogurt, olives, red peppers, capers, green onions o Garlic rosemary lamb pita: lamb sauted with garlic, rosemary, salt & pepper; add lettuce, cucumber, greek yogurt to pita - flavor with lemon juice and black pepper    Seafood:  o Mediterranean grilled salmon, seasoned with garlic, basil, parsley, lemon juice and black pepper o Shrimp, lemon, and spinach whole-grain pasta salad made with low fat greek yogurt  o Seared scallops with lemon orzo  o Seared tuna steaks seasoned salt, pepper, coriander topped with tomato mixture of olives, tomatoes, olive oil, minced garlic, parsley, green onions and cappers    Meats:  o Herbed greek chicken salad with kalamata olives, cucumber, feta  o Red bell peppers stuffed with spinach, bulgur, lean ground beef (or lentils) & topped with feta   o Kebabs: skewers of chicken, tomatoes, onions, zucchini, squash  o [place]  burgers: made with red onions, mint, dill, lemon juice, feta cheese topped with roasted red peppers   Vegetarian o Cucumber salad: cucumbers, artichoke hearts, celery, red onion, feta cheese, tossed in olive oil & lemon juice  o Hummus and whole grain pita points with a greek salad (lettuce, tomato, feta, olives, cucumbers, red onion) o Lentil soup with celery, carrots made with vegetable broth, garlic, salt and pepper  o Tabouli salad: parsley, bulgur, mint, scallions, cucumbers, tomato, radishes, lemon juice, olive oil, salt and pepper.    I have prescribed Verelan 120 mg 1 capsule at bedtime. Measure your blood pressure at home. Continue to work on lifestyle changes.    Interstitial Cystitis Interstitial cystitis (IC) is a condition that results in discomfort or pain in the bladder and the surrounding pelvic region. The symptoms can be different from case to case and even in the same individual. People may experience:  Mild discomfort.  Pressure.  Tenderness.  Intense pain in the bladder and pelvic area. CAUSES  Because IC varies so much in symptoms and severity, people studying this disease  believe it is not one but several diseases. Some caregivers use the term painful bladder syndrome (PBS) to describe cases with painful urinary symptoms. This may not meet the strictest definition of IC. The term IC / PBS includes all cases of urinary pain that cannot be connected to other causes, such as infection or urinary stones.  SYMPTOMS  Symptoms may include:  An urgent need to urinate.  A frequent need to urinate.  A combination of these symptoms. Pain may change in intensity as the bladder fills with urine or as it empties. Women's symptoms often get worse during menstruation. They may sometimes experience pain with vaginal intercourse. Some of the symptoms of IC / PBS seem like those of bacterial infection. Tests do not show infection. IC / PBS is far more common in women than in men.   DIAGNOSIS  The diagnosis of IC / PBS is based on:  Presence of pain related to the bladder, usually along with problems of frequency and urgency.  Not finding other diseases that could cause the symptoms.  Diagnostic tests that help rule out other diseases include:  Urinalysis.  Urine culture.  Cystoscopy.  Biopsy of the bladder wall.  Distension of the bladder under anesthesia.  Urine cytology.  Laboratory examination of prostate secretions. A biopsy is a tissue sample that can be looked at under a microscope. Samples of the bladder and urethra may be removed during a cystoscopy. A biopsy helps rule out bladder cancer. TREATMENT  Scientists have not yet found a cure for IC / PBS. Patients with IC / PBS do not get better with antibiotic therapy. Caregivers cannot predict who will respond best to which treatment. Symptoms may disappear without explanation. Disappearing symptoms may coincide with an event such as a change in diet or treatment. Even when symptoms disappear, they may return after days, weeks, months, or years.  Because the causes of IC / PBS are unknown, current treatments are aimed at relieving symptoms. Many people are helped by one or a combination of the treatments. As researchers learn more about IC / PBS, the list of potential treatments will change. Patients should discuss their options with a caregiver. SURGERY  Surgery should be considered only if all available treatments have failed and the pain is disabling. Many approaches and techniques are used. Each approach has its own advantages and complications. Advantages and complications should be discussed with a urologist. Your caregiver may recommend consulting another urologist for a second opinion. Most caregivers are reluctant to operate because the outcome is unpredictable. Some people still have symptoms after surgery.  People considering surgery should discuss the potential risks and benefits, side effects,  and long- and short-term complications with their family, as well as with people who have already had the procedure. Surgery requires anesthesia, hospitalization, and in some cases weeks or months of recovery. As the complexity of the procedure increases, so do the chances for complications and for failure. HOME CARE INSTRUCTIONS   All drugs, even those sold over the counter, have side effects. Patients should always consult a caregiver before using any drug for an extended amount of time. Only take over-the-counter or prescription medicines for pain, discomfort, or fever as directed by your caregiver.  Many patients feel that smoking makes their symptoms worse. How the by-products of tobacco that are excreted in the urine affect IC / PBS is unknown. Smoking is the major known cause of bladder cancer. One of the best things smokers can do for their bladder  and their overall health is to quit.  Many patients feel that gentle stretching exercises help relieve IC / PBS symptoms.  Methods vary, but basically patients decide to empty their bladder at designated times and use relaxation techniques and distractions to keep to the schedule. Gradually, patients try to lengthen the time between scheduled voids. A diary in which to record voiding times is usually helpful in keeping track of progress. MAKE SURE YOU:   Understand these instructions.  Will watch your condition.  Will get help right away if you are not doing well or get worse. Document Released: 06/28/2004 Document Revised: 01/20/2012 Document Reviewed: 09/12/2008 [facility] Patient Information 2015 [place], [place]. This information is not intended to replace advice given to you by your health care provider. Make sure you discuss any questions you have with your health care provider.

## 2014-07-05 NOTE — Progress Notes (Signed)
Subjective:    Patient ID: Abigail [name], female    DOB: 1961-02-24, 53 y.o.   MRN: [id number]  Urinary Frequency  Associated symptoms include frequency.  Hypertension Associated symptoms include headaches.    This 53 y.o. 53 female was recently hospitalized after presenting to [facility] w/ CP and L arm pain. Cardiac evaluation included coronary angiography which showed normal coronary arteries and normal LV function. Pt reports family hx of coronary artery spasms and cardiomyopathy which she did not share with Dr. Martinique, the cardiologist. Follow-up with him is "as needed" basis. Pt has been pain-free since discharge; she also quit her job which she admits was contributing to significant stress. Pt also has GERD; OTC ranitidine and PPI control her symptoms. Pt has hx of cervical DDD; she is s/p decompression procedure in 2012. She does not think this is a factor related to atypical CP.   HTN- Pt was discharged from hospital on losartan 50 mg daily and Metoprolol succinate 25 mg 1 tab daily. She discontinued the B-blocker due to side effects; pt had fatigue, moodiness and, in general, did not feel well. She weaned herself on Metoprolol but now BP is increasing. Pt is having migraines w/ increased BP.   Pt also having bladder pressure, nocturia, frequency; has hx of interstitial cystitis evaluated by urology years ago. Took Amoxicillin 500 mg 1 bid for 4 days and symptoms resolved. Pt requests urine culture (micro today is negative). Pt had GYN evaluation in Fall 2014; pelvic US to look at ovaries was normal.  Pt reports making some changes re: caffeine and sodas as well as other lifestyle changes.  Patient Active Problem List   Diagnosis Date Noted  . DDD (degenerative disc disease), cervical 07/05/2014  . Arm pain, left- possible anginal pain 06/14/2014  . Family history of coronary artery disease in father 06/14/2014  . Left arm pain 06/14/2014  . Unstable angina 06/14/2014  . OSA on  CPAP 06/07/2014  . Obesity (BMI 30-39.9) 06/07/2014  . SS-B antibody positive 06/07/2014  . Unspecified constipation 07/28/2013  . Menopause 10/18/2012  . Palpitations 10/18/2012  . Essential hypertension, benign 10/18/2012  . Melanoma 10/02/2012  . SLE (systemic lupus erythematosus) 08/17/2012  . Dysplastic nevus 08/04/2012  . ADD (attention deficit disorder) 02/26/2012  . Depression with anxiety 02/26/2012  . Insomnia 02/26/2012  . Reactive airways 01/20/2012  . Diffuse disease of connective tissue 09/11/2011  . DIVERTICULITIS, COLON 10/03/2010  . GERD 01/08/2010  . [place] SYNDROME 01/08/2010  . CHOLECYSTECTOMY, HX OF 01/08/2010    Prior to Admission medications   Medication Sig Start Date End Date Taking? Authorizing Provider  albuterol (PROVENTIL HFA;VENTOLIN HFA) 108 (90 BASE) MCG/ACT inhaler Inhale 2 puffs into the lungs every 6 (six) hours as needed for shortness of breath. For shortness of breath. 04/29/14  Yes Orma Flaming, MD  amphetamine-dextroamphetamine (ADDERALL) 10 MG tablet Take 10 mg by mouth daily as needed.  08/16/13  Yes Wardell Honour, MD  estradiol (ESTRACE) 1 MG tablet Take 1 mg by mouth 2 (two) times a week. 05/31/13  Yes Wardell Honour, MD  fluticasone (FLONASE) 50 MCG/ACT nasal spray Place 2 sprays into both nostrils daily. 04/29/14  Yes Orma Flaming, MD  LORazepam (ATIVAN) 1 MG tablet Take 1 tablet (1 mg total) by mouth 2 (two) times daily as needed. For anxiety 01/19/14  Yes Wardell Honour, MD  losartan (COZAAR) 50 MG tablet Take 1 tablet (50 mg total) by mouth daily. 05/31/14  Yes Barton Fanny, MD  potassium chloride (KLOR-CON) 20 MEQ packet Take 20 mEq by mouth daily as needed. Take when potassium is low 06/07/14  Yes Barton Fanny, MD  ranitidine (ZANTAC) 75 MG tablet Take 75 mg by mouth 2 (two) times daily. Pt takes OTC 75-150 mg as needed (2x/week)   Yes Historical Provider, MD  zolpidem (AMBIEN) 10 MG tablet Take 5 mg by mouth at bedtime as  needed. For insomnia 04/29/14  Yes Orma Flaming, MD    History   Social History  . Marital Status: Married    Spouse Name: [name]    Number of Children: 2  . Years of Education: Bachelors   Occupational History  .  [employer]   Social History Main Topics  . Smoking status: Never Smoker   . Smokeless tobacco: Never Used  . Alcohol Use: Yes     Comment: 01/20/12 once/month  . Drug Use: No  . Sexual Activity: Yes    Partners: Male   Other Topics Concern  . Not on file   Social History Narrative   Marital status: married x 28 years to [place]; happily married; no abuse      Children: 2 children (48 yo son, 47 yo daughter); no grandchildren      Lives with: husband, daughter      Employment:  [employer] 2009-2013.  Resigned 08/2012.  Bachelor degrees in communication.     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   Patient is left -handed.         Tobacco:  None      Alcohol:  Once weekly; wine      Drugs: none      Exercise:  none          Review of Systems  Constitutional: Positive for fatigue. Negative for fever, diaphoresis and unexpected weight change.  Eyes: Negative.   Respiratory: Negative.   Cardiovascular: Negative.   Genitourinary: Positive for frequency and decreased urine volume. Negative for dysuria, difficulty urinating and pelvic pain.  Musculoskeletal: Negative.   Neurological: Positive for headaches.  Psychiatric/Behavioral: Negative.       Objective:   Physical Exam  Nursing note and vitals reviewed. Constitutional: She is oriented to person, place, and time. She appears well-developed and well-nourished. No distress.  HENT:  Head: Normocephalic and atraumatic.  Right Ear: External ear normal.  Left Ear: External ear normal.  Nose: Nose normal.  Mouth/Throat: Oropharynx is clear and moist.  Eyes: Conjunctivae and EOM are normal. Pupils are equal, round, and reactive to light. No scleral  icterus.  Neck: Normal range of motion.  Cardiovascular: Normal rate and regular rhythm.   Pulmonary/Chest: Effort normal. No respiratory distress.  Musculoskeletal: Normal range of motion. She exhibits no edema.  Neurological: She is alert and oriented to person, place, and time. No cranial nerve deficit. She exhibits normal muscle tone. Coordination normal.  Skin: Skin is warm and dry. She is not diaphoretic. No erythema. No pallor.  Psychiatric: She has a normal mood and affect. Her behavior is normal. Judgment and thought content normal.    Results for orders placed in visit on 07/05/14  POCT URINALYSIS DIPSTICK      Result Value Ref Range   Color, UA yellow     Clarity, UA clear     Glucose, UA neg     Bilirubin, UA neg     Ketones, UA neg  Spec Grav, UA 1.010     Blood, UA trace     pH, UA 6.5     Protein, UA neg     Urobilinogen, UA 0.2     Nitrite, UA neg     Leukocytes, UA Negative    POCT UA - MICROSCOPIC ONLY      Result Value Ref Range   WBC, Ur, HPF, POC neg     RBC, urine, microscopic 0-1     Bacteria, U Microscopic trace     Mucus, UA neg     Epithelial cells, urine per micros neg     Crystals, Ur, HPF, POC neg     Casts, Ur, LPF, POC neg     Yeast, UA neg         Assessment & Plan:  Urinary frequency - Refill Amoxicillin 500 mg 1 bid for 10 days pending Urology evaluation. Plan: POCT urinalysis dipstick, POCT UA - Microscopic Only, Ambulatory referral to Urology  DDD (degenerative disc disease), cervical- Stable though this could be contributing to atypical CP.  Atypical chest pain- HTN control >> add Verelan HS 120 mg 1 tablet at bedtime.  Medication side effects, initial encounter (Metoprolol discontinued).  Interstitial cystitis - Plan: Ambulatory referral to Urology  Meds ordered this encounter  . amoxicillin (AMOXIL) 500 MG capsule    Sig: Take 1 capsule (500 mg total) by mouth 2 (two) times daily.    Dispense:  30 capsule    Refill:  1    . verapamil (VERELAN PM) 120 MG 24 hr capsule    Sig: Take 1 capsule (120 mg total) by mouth at bedtime.    Dispense:  30 capsule    Refill:  2

## 2014-07-15 ENCOUNTER — Ambulatory Visit: Payer: Medicare Other | Admitting: Family Medicine

## 2014-08-17 ENCOUNTER — Other Ambulatory Visit: Payer: Self-pay | Admitting: Family Medicine

## 2014-08-17 NOTE — Telephone Encounter (Signed)
Dr Leward Quan, pt saw you in Aug, but I don't see this med discussed recently. Do you want to give RFs?

## 2014-08-18 NOTE — Telephone Encounter (Signed)
I refilled this medication for 30 days w/ 1 additional refill. I do not see where she has had a recent mammogram (last one 2012). She must get one scheduled before I authorize further refills on this medication. Please advise her. Thanks.

## 2014-08-23 ENCOUNTER — Other Ambulatory Visit: Payer: Self-pay | Admitting: Radiology

## 2014-08-23 MED ORDER — LORAZEPAM 1 MG PO TABS: 1.0000 mg | ORAL_TABLET | Freq: Two times a day (BID) | ORAL | Status: DC | PRN

## 2014-08-23 NOTE — Telephone Encounter (Signed)
Lorazepam refills phoned to pt's pharmacy.

## 2014-08-23 NOTE — Telephone Encounter (Signed)
Patient has seen Dr. Leward Quan for past several visits; will forward refill to [facility].  Refills need to come from one provider.

## 2014-08-23 NOTE — Telephone Encounter (Signed)
Patients pharmacy requesting renewal on Ativan, pended please advise.

## 2014-08-24 IMAGING — CT CT ABD-PELV W/ CM
2 of 5 series · 17 of 46 positions shown, 19 images · IV contrast (Omnipaque 300)
Comparison: January 20, 2012

CLINICAL DATA: Abdominal pain with nausea

EXAM:
CT ABDOMEN AND PELVIS WITH CONTRAST
TECHNIQUE: Multidetector CT imaging of the abdomen and pelvis was performed
using the standard protocol following bolus administration of
intravenous contrast. Oral contrast was also administered.
CONTRAST:  100mL OMNIPAQUE IOHEXOL 300 MG/ML  SOLN

[Series 2: abd/ pel 5mm · axial · 0.75mm/px · z∈[-528,-108]mm · 14 of 94 slices shown, 16 images]
[im 5/94  soft-tissue]
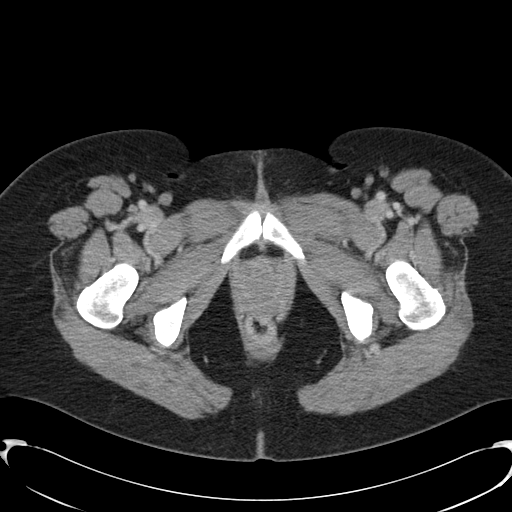
[im 5/94  bone]
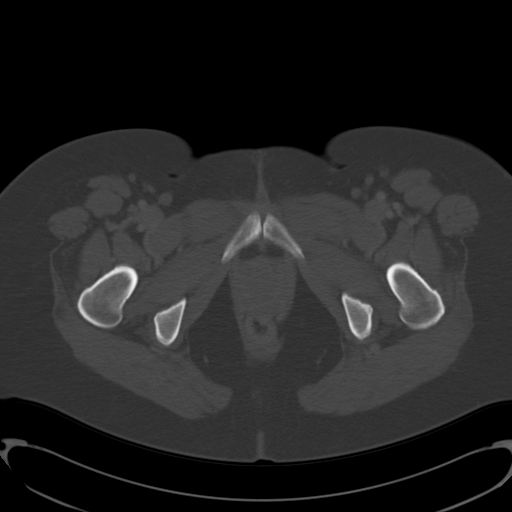
[im 10/94  soft-tissue]
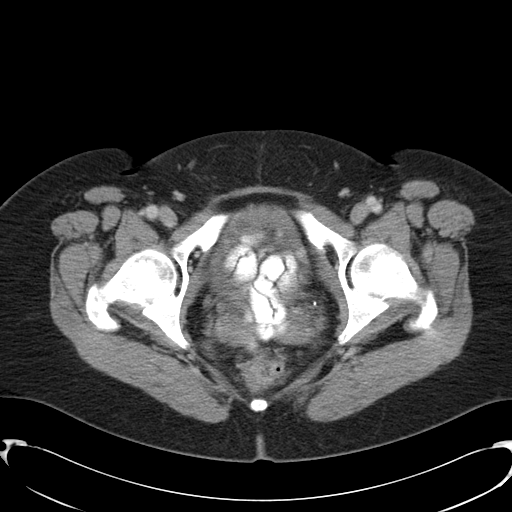
[im 20/94  soft-tissue]
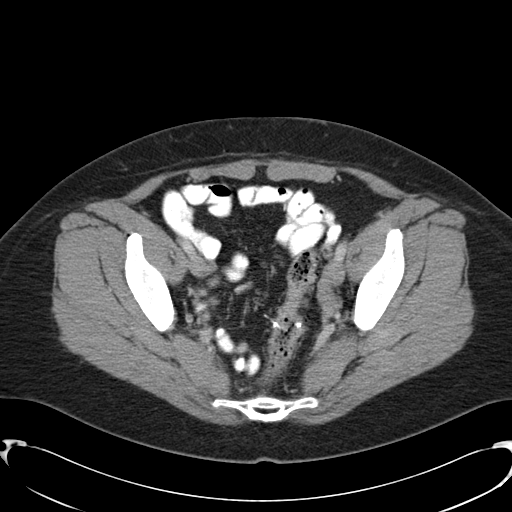
[im 25/94  soft-tissue]
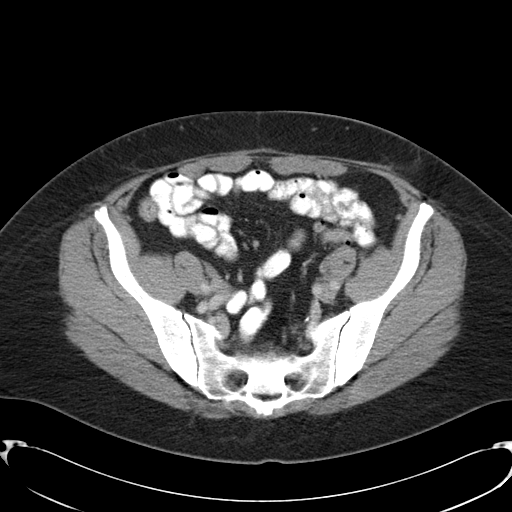
[im 30/94  soft-tissue]
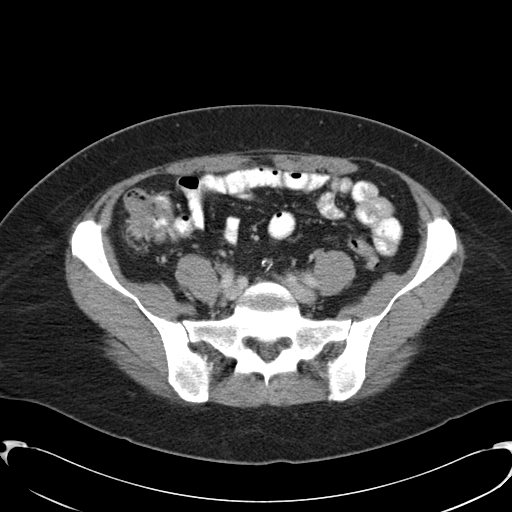
[im 40/94  soft-tissue]
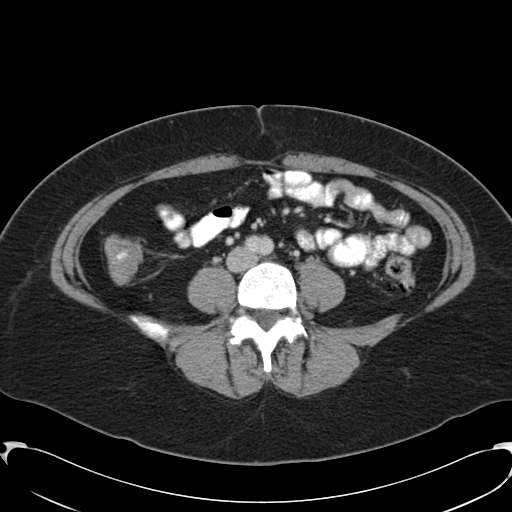
[im 45/94  soft-tissue]
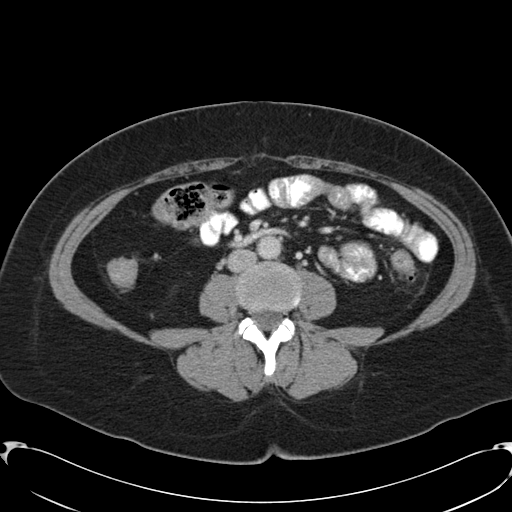
[im 49/94  soft-tissue]
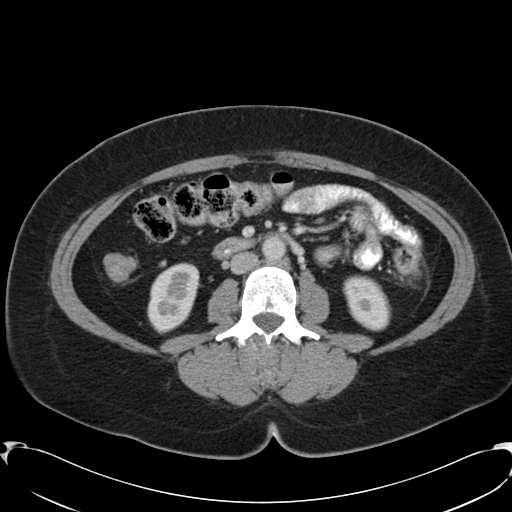
[im 54/94  soft-tissue]
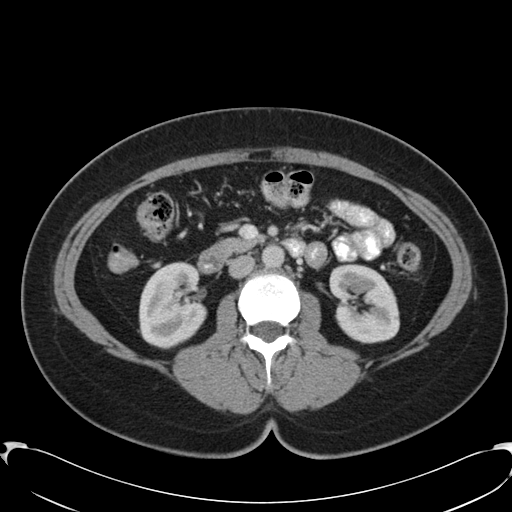
[im 54/94  bone]
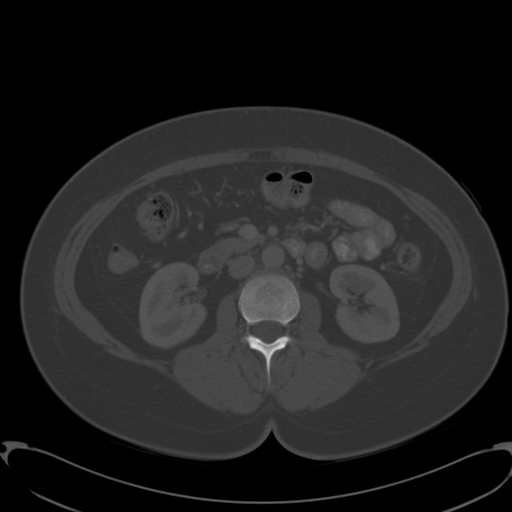
[im 64/94  soft-tissue]
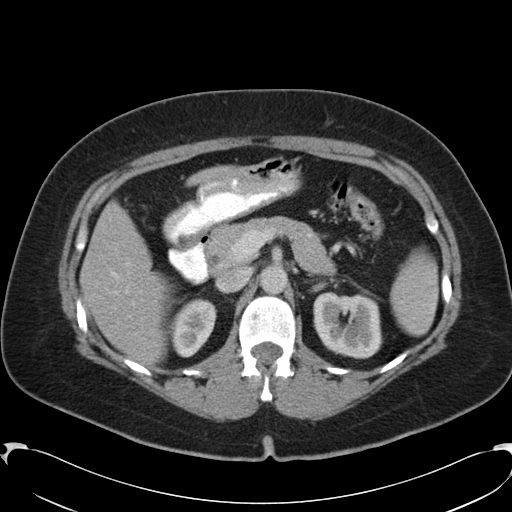
[im 69/94  soft-tissue]
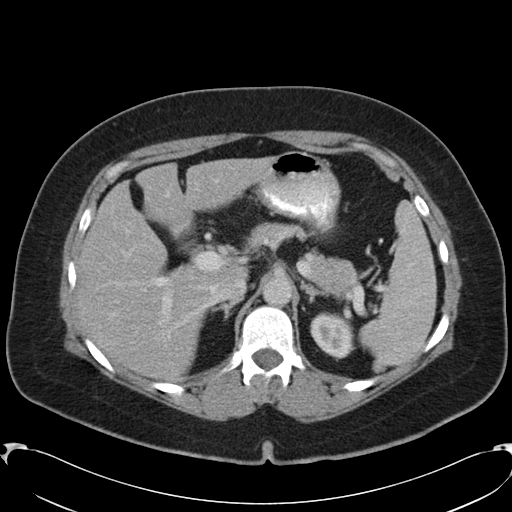
[im 74/94  soft-tissue]
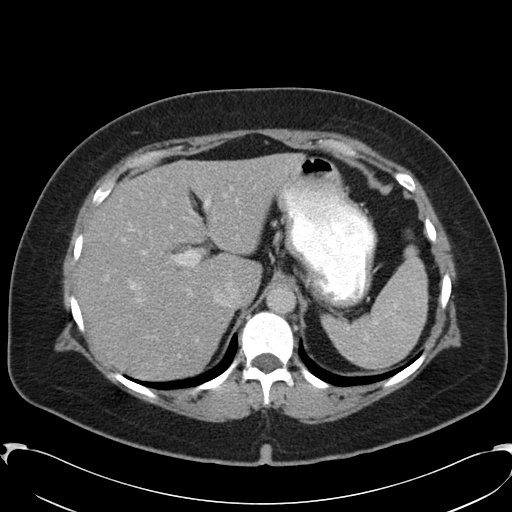
[im 84/94  soft-tissue]
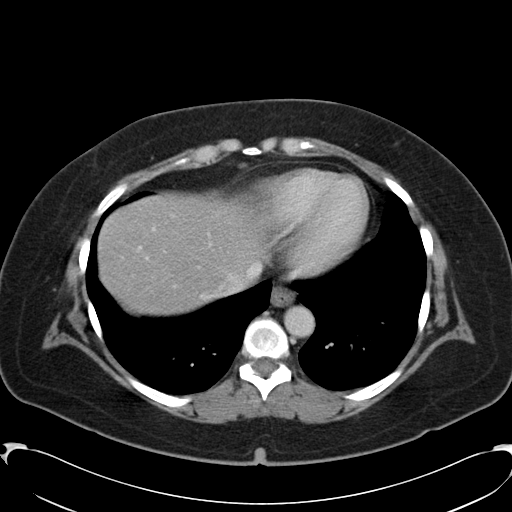
[im 89/94  soft-tissue]
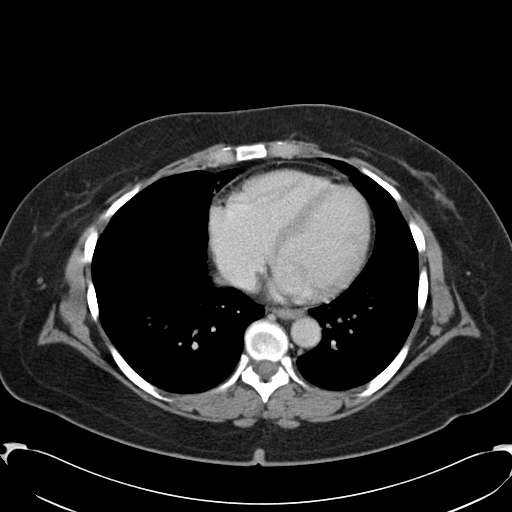

[Series 602: cor · coronal · 0.94mm/px · 3 of 112 slices shown]
[im 38/112  soft-tissue]
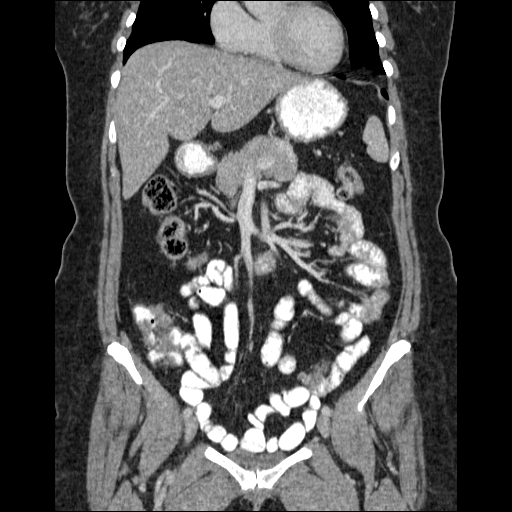
[im 50/112  soft-tissue]
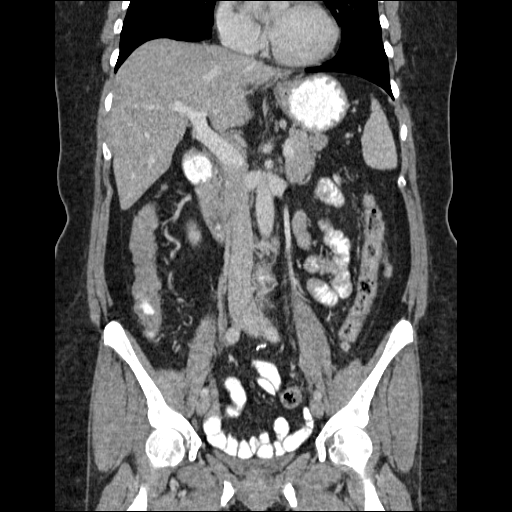
[im 62/112  soft-tissue]
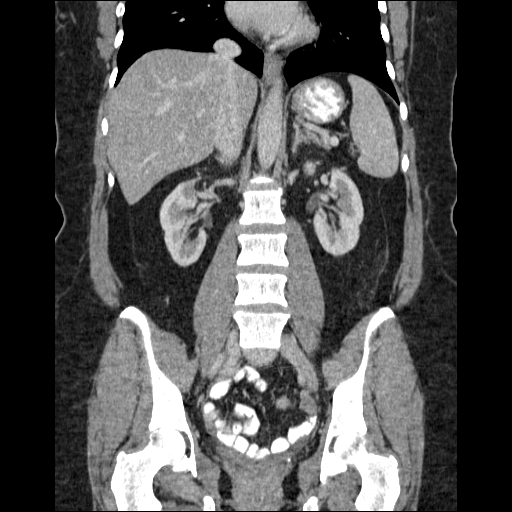

[17 of 46 positions shown; findings below may reference images not displayed]

FINDINGS: Lung bases are clear.

There is fatty change in the liver. No focal liver lesions are
identified. Gallbladder is absent. There is no biliary duct
dilatation.

Spleen, pancreas, and adrenals appear normal. Note that there is a
small accessory spleen posteriorly, a stable anatomic variant.

There is a stable 5 mm cyst in the periphery of the right kidney. No
new renal mass is appreciable. There is no hydronephrosis on either
side. There is no demonstrable ureteral calculus or ureterectasis on
either side.

In the pelvis, there is no mass or fluid collection. There is some
mild fold thickening in the distal descending colon with minimal
surrounding mesenteric thickening consistent with focal colitis.
There may be mild diverticulitis in this area. There is no abscess.
No extraluminal air is appreciated. There are sigmoid diverticula
without frank inflammation in the sigmoid colon region.
Postoperative change consistent with previous partial sigmoidectomy
noted. Appendix is absent.

There is no bowel obstruction.  No free air or portal venous air.

There is no ascites, adenopathy, or abscess in the abdomen or
pelvis. Aorta is nonaneurysmal. There is degenerative change in the
lumbar spine. There are no blastic or lytic bone lesions.
IMPRESSION: Distal descending colonic colitis with probable mild diverticulitis.
No abscess or extraluminal air. No bowel obstruction. No abscess.
Gallbladder and appendix absent. Postoperative change is noted in
the sigmoid colon.

## 2014-10-06 ENCOUNTER — Ambulatory Visit (INDEPENDENT_AMBULATORY_CARE_PROVIDER_SITE_OTHER): Payer: Medicare Other | Admitting: Family Medicine

## 2014-10-06 ENCOUNTER — Ambulatory Visit (INDEPENDENT_AMBULATORY_CARE_PROVIDER_SITE_OTHER): Payer: Medicare Other

## 2014-10-06 VITALS — BP 102/78 | HR 97 | Temp 97.3°F | Resp 16 | Ht 65.0 in | Wt 212.0 lb

## 2014-10-06 DIAGNOSIS — G43A0 Cyclical vomiting, not intractable: Secondary | ICD-10-CM

## 2014-10-06 DIAGNOSIS — R1013 Epigastric pain: Secondary | ICD-10-CM

## 2014-10-06 DIAGNOSIS — K861 Other chronic pancreatitis: Secondary | ICD-10-CM

## 2014-10-06 DIAGNOSIS — M25512 Pain in left shoulder: Secondary | ICD-10-CM

## 2014-10-06 DIAGNOSIS — K859 Acute pancreatitis without necrosis or infection, unspecified: Secondary | ICD-10-CM

## 2014-10-06 DIAGNOSIS — R1115 Cyclical vomiting syndrome unrelated to migraine: Secondary | ICD-10-CM

## 2014-10-06 LAB — POCT CBC
Granulocyte percent: 45.3 %G (ref 37–80)
HCT, POC: 42.7 % (ref 37.7–47.9)
Hemoglobin: 13.9 g/dL (ref 12.2–16.2)
Lymph, poc: 1.3 (ref 0.6–3.4)
MCH, POC: 27.6 pg (ref 27–31.2)
MCHC: 32.5 g/dL (ref 31.8–35.4)
MCV: 85 fL (ref 80–97)
MID (cbc): 0.3 (ref 0–0.9)
MPV: 8.5 fL (ref 0–99.8)
POC Granulocyte: 1.3 — AB (ref 2–6.9)
POC LYMPH PERCENT: 45.4 %L (ref 10–50)
POC MID %: 9.3 %M (ref 0–12)
Platelet Count, POC: 208 10*3/uL (ref 142–424)
RBC: 5.03 M/uL (ref 4.04–5.48)
RDW, POC: 13.7 %
WBC: 2.8 10*3/uL — AB (ref 4.6–10.2)

## 2014-10-06 LAB — POCT SEDIMENTATION RATE: POCT SED RATE: 20 mm/hr (ref 0–22)

## 2014-10-06 MED ORDER — METHYLPREDNISOLONE ACETATE 80 MG/ML IJ SUSP
80.0000 mg | Freq: Once | INTRAMUSCULAR | Status: AC
  Administered 2014-10-06: 80 mg via INTRA_ARTICULAR

## 2014-10-06 NOTE — Patient Instructions (Signed)
Expand All Collapse All     Cardiac Catheterization Procedure Note  Name: Abigail [name] MRN: [id number] DOB: 1961-10-03  Procedure: Left Heart Cath, Selective Coronary Angiography, LV angiography  Indication: 53 yo WF presents with symptoms of chest pain and T wave abnormality on Ecg.  Procedural details: Initially right radial access was attempted. The pulse was poor and ultrasound demonstrated a tiny radial artery that was not large enough for cannulation. The right groin was prepped, draped, and anesthetized with 1% lidocaine. Using modified Seldinger technique, a 5 French sheath was introduced into the right femoral artery. Standard Judkins catheters were used for coronary angiography and left ventriculography. Catheter exchanges were performed over a guidewire. There were no immediate procedural complications. The patient was transferred to the post catheterization recovery area for further monitoring.  Procedural Findings: Hemodynamics:  AO 161/92 mean 122 LV 158/11 mm Hg  Coronary angiography: Coronary dominance: right  Left mainstem: Normal  Left anterior descending (LAD): Normal  Left circumflex (LCx): Normal  Right coronary artery (RCA): Normal  Left ventriculography: Left ventricular systolic function is normal, LVEF is estimated at 55-65%, there is no significant mitral regurgitation   Final Conclusions:  1. Normal coronary anatomy 2. Normal LV function.   Recommendations: Consider noncardiac causes of chest pain.  Peter Martinique, [place] 06/15/2014, 1:56 PM

## 2014-10-06 NOTE — Progress Notes (Signed)
53 yo woman with 9 months of shoulder pain, worse the last 3 months.  She has undergone cervical spine fusion and had recent MRI of cervical spine ([place]) which did not show enough abnormal to account for the shoulder pain radiating down left arm.  She has been followed for lupus at [facility].  The lupus is associated with a Sjogren's syndrome.  She took two pain meds last night, but this hyped her up and did not resolve the pain.  Pain in left shoulder radiates down the arm intermittently and is quite severe today.  It is worse when moving the arm.   Objective:  NAD Weak left grasp Normal reflexes in upper extremities. Full neck ROM Full left arm ROM   Expand All Collapse All     Cardiac Catheterization Procedure Note  Name: Abigail [name] MRN: [id number] DOB: 1960-12-02  Procedure: Left Heart Cath, Selective Coronary Angiography, LV angiography  Indication: 53 yo WF presents with symptoms of chest pain and T wave abnormality on Ecg.  Procedural details: Initially right radial access was attempted. The pulse was poor and ultrasound demonstrated a tiny radial artery that was not large enough for cannulation. The right groin was prepped, draped, and anesthetized with 1% lidocaine. Using modified Seldinger technique, a 5 French sheath was introduced into the right femoral artery. Standard Judkins catheters were used for coronary angiography and left ventriculography. Catheter exchanges were performed over a guidewire. There were no immediate procedural complications. The patient was transferred to the post catheterization recovery area for further monitoring.  Procedural Findings: Hemodynamics:  AO 161/92 mean 122 LV 158/11 mm Hg  Coronary angiography: Coronary dominance: right  Left mainstem: Normal  Left anterior descending (LAD): Normal  Left circumflex (LCx): Normal  Right coronary artery (RCA): Normal  Left ventriculography: Left  ventricular systolic function is normal, LVEF is estimated at 55-65%, there is no significant mitral regurgitation   Final Conclusions:  1. Normal coronary anatomy 2. Normal LV function.   Recommendations: Consider noncardiac causes of chest pain.  Peter Martinique, [place] 06/15/2014, 1:56 PM     [facility] reading (PRIMARY) by  Dr. Joseph Art:  Neg shoulder  Left shoulder pain - Plan: DG Shoulder Left, methylPREDNISolone acetate (DEPO-MEDROL) injection 80 mg, Ambulatory referral to Orthopedic Surgery  Pancreatitis, recurrent - Plan: POCT CBC, POCT SEDIMENTATION RATE, Comprehensive metabolic panel, Amylase, Lipase  Abdominal pain, epigastric - Plan: POCT CBC, POCT SEDIMENTATION RATE, Comprehensive metabolic panel, Amylase, Lipase  Non-intractable cyclical vomiting with nausea - Plan: POCT CBC, POCT SEDIMENTATION RATE, Comprehensive metabolic panel, Amylase, Lipase  Signed, Abigail Haber, MD   .

## 2014-10-07 ENCOUNTER — Telehealth: Payer: Self-pay

## 2014-10-07 LAB — LIPASE: Lipase: 69 U/L (ref 0–75)

## 2014-10-07 LAB — COMPREHENSIVE METABOLIC PANEL
ALT: 31 U/L (ref 0–35)
AST: 29 U/L (ref 0–37)
Albumin: 4.2 g/dL (ref 3.5–5.2)
Alkaline Phosphatase: 75 U/L (ref 39–117)
BUN: 16 mg/dL (ref 6–23)
CO2: 24 mEq/L (ref 19–32)
Calcium: 9 mg/dL (ref 8.4–10.5)
Chloride: 105 mEq/L (ref 96–112)
Creat: 0.83 mg/dL (ref 0.50–1.10)
Glucose, Bld: 91 mg/dL (ref 70–99)
Potassium: 4.2 mEq/L (ref 3.5–5.3)
Sodium: 138 mEq/L (ref 135–145)
Total Bilirubin: 0.4 mg/dL (ref 0.2–1.2)
Total Protein: 7.1 g/dL (ref 6.0–8.3)

## 2014-10-07 LAB — AMYLASE: Amylase: 52 U/L (ref 0–105)

## 2014-10-07 NOTE — Telephone Encounter (Signed)
Patient would like lab results.

## 2014-10-07 NOTE — Telephone Encounter (Signed)
They are pending, my chart message sent.

## 2014-10-11 NOTE — Telephone Encounter (Signed)
Patient is very concerned about her white count at last visit. Any suggestions for follow up?

## 2014-10-12 ENCOUNTER — Telehealth: Payer: Self-pay

## 2014-10-12 NOTE — Telephone Encounter (Signed)
Will you guys try to get the MRI scan from Dr Joya Salm scanned in for her?

## 2014-10-12 NOTE — Telephone Encounter (Signed)
Per Amy Dr. Joya Salm was to fax over this information on MRI to Dr. Elder Cyphers Dec 1.   Amy will let Shazia know we have not received it to date.

## 2014-10-12 NOTE — Telephone Encounter (Signed)
The WBC at 2.8 is commonly seen with viral infections as well as most connective tissue diseases.  It is not dangerous and will be low from time to time without overall risk to patient health.  We can recheck in one month if patient is feeling weak or is running fevers.

## 2014-10-13 NOTE — Telephone Encounter (Signed)
Pt advised about the WBC.

## 2014-10-15 ENCOUNTER — Other Ambulatory Visit: Payer: Self-pay | Admitting: *Deleted

## 2014-10-15 MED ORDER — OXYCODONE HCL 10 MG PO TABS: 10.0000 mg | ORAL_TABLET | Freq: Three times a day (TID) | ORAL | Status: DC

## 2014-10-15 NOTE — Telephone Encounter (Signed)
Dr. Elder Cyphers wrote rx for oxycodone for pt because ortho doctor not available for her severe shoulder and arm pain after steroid shot.

## 2014-10-20 ENCOUNTER — Encounter (HOSPITAL_COMMUNITY): Payer: Self-pay | Admitting: Cardiology

## 2014-10-22 ENCOUNTER — Other Ambulatory Visit: Payer: Self-pay | Admitting: Family Medicine

## 2014-11-01 ENCOUNTER — Ambulatory Visit (INDEPENDENT_AMBULATORY_CARE_PROVIDER_SITE_OTHER): Payer: Medicare Other | Admitting: Family Medicine

## 2014-11-01 ENCOUNTER — Encounter: Payer: Self-pay | Admitting: Family Medicine

## 2014-11-01 VITALS — BP 115/76 | HR 85 | Temp 98.6°F | Resp 16 | Ht 65.0 in | Wt 215.0 lb

## 2014-11-01 DIAGNOSIS — F329 Major depressive disorder, single episode, unspecified: Secondary | ICD-10-CM

## 2014-11-01 DIAGNOSIS — N951 Menopausal and female climacteric states: Secondary | ICD-10-CM

## 2014-11-01 DIAGNOSIS — F32A Depression, unspecified: Secondary | ICD-10-CM

## 2014-11-01 DIAGNOSIS — R232 Flushing: Secondary | ICD-10-CM

## 2014-11-01 DIAGNOSIS — I10 Essential (primary) hypertension: Secondary | ICD-10-CM

## 2014-11-01 DIAGNOSIS — R300 Dysuria: Secondary | ICD-10-CM

## 2014-11-01 LAB — CBC WITH DIFFERENTIAL/PLATELET
Basophils Absolute: 0 10*3/uL (ref 0.0–0.1)
Basophils Relative: 1 % (ref 0–1)
Eosinophils Absolute: 0.1 10*3/uL (ref 0.0–0.7)
Eosinophils Relative: 2 % (ref 0–5)
HEMATOCRIT: 40.2 % (ref 36.0–46.0)
HEMOGLOBIN: 13.4 g/dL (ref 12.0–15.0)
LYMPHS ABS: 1.5 10*3/uL (ref 0.7–4.0)
Lymphocytes Relative: 35 % (ref 12–46)
MCH: 27.8 pg (ref 26.0–34.0)
MCHC: 33.3 g/dL (ref 30.0–36.0)
MCV: 83.4 fL (ref 78.0–100.0)
MONOS PCT: 9 % (ref 3–12)
MPV: 11.2 fL (ref 9.4–12.4)
Monocytes Absolute: 0.4 10*3/uL (ref 0.1–1.0)
NEUTROS PCT: 53 % (ref 43–77)
Neutro Abs: 2.2 10*3/uL (ref 1.7–7.7)
Platelets: 237 10*3/uL (ref 150–400)
RBC: 4.82 MIL/uL (ref 3.87–5.11)
RDW: 14.3 % (ref 11.5–15.5)
WBC: 4.2 10*3/uL (ref 4.0–10.5)

## 2014-11-01 LAB — POCT URINALYSIS DIPSTICK
Bilirubin, UA: NEGATIVE
Glucose, UA: NEGATIVE
KETONES UA: NEGATIVE
LEUKOCYTES UA: NEGATIVE
Nitrite, UA: NEGATIVE
PH UA: 6
Protein, UA: NEGATIVE
RBC UA: NEGATIVE
Spec Grav, UA: 1.01
Urobilinogen, UA: 0.2

## 2014-11-01 MED ORDER — LORAZEPAM 1 MG PO TABS: 1.0000 mg | ORAL_TABLET | Freq: Two times a day (BID) | ORAL | Status: DC | PRN

## 2014-11-01 MED ORDER — VERAPAMIL HCL ER 120 MG PO CP24: 120.0000 mg | ORAL_CAPSULE | Freq: Every day | ORAL | Status: DC

## 2014-11-01 MED ORDER — SERTRALINE HCL 50 MG PO TABS: 50.0000 mg | ORAL_TABLET | Freq: Every day | ORAL | Status: DC

## 2014-11-01 MED ORDER — ESTRADIOL 1 MG PO TABS: 1.0000 mg | ORAL_TABLET | Freq: Every day | ORAL | Status: DC

## 2014-11-01 MED ORDER — LOSARTAN POTASSIUM 50 MG PO TABS: 50.0000 mg | ORAL_TABLET | Freq: Every day | ORAL | Status: DC

## 2014-11-01 NOTE — Patient Instructions (Addendum)
Sore throat and sinus congestion- I do not think you need an antibiotic. Try over-the-counter AYR saline nasal mist to relieve congestion. Throat lozenges can help relieve sore throat;  ELDERTONIC is a natural herb that is available in a soft chew/lozenge. Try this to help relieve upper respiratory symptoms.  An AIR PURIFIER in the bedroom could help remove environmental allergens and clean the air so that you have less upper respiratory symptoms.   ZOLOFT 50 mg-  Take 1/2 tablet every morning for rest of Dec 2015. Starting Nov 11, 2014, increase dose to 1 tablet every morning. If the dose needs to be increased,we can discuss that via phone or MyChart of you have signed on.    Your urine specimen is absolutely clean.  I am including some information about IC; if your symptoms persist, a UROLOGY referral may be necessary.   Interstitial Cystitis Interstitial cystitis (IC) is a condition that results in discomfort or pain in the bladder and the surrounding pelvic region. The symptoms can be different from case to case and even in the same individual. People may experience:  Mild discomfort.  Pressure.  Tenderness.  Intense pain in the bladder and pelvic area. CAUSES  Because IC varies so much in symptoms and severity, people studying this disease believe it is not one but several diseases. Some caregivers use the term painful bladder syndrome (PBS) to describe cases with painful urinary symptoms. This may not meet the strictest definition of IC. The term IC / PBS includes all cases of urinary pain that cannot be connected to other causes, such as infection or urinary stones.  SYMPTOMS  Symptoms may include:  An urgent need to urinate.  A frequent need to urinate.  A combination of these symptoms. Pain may change in intensity as the bladder fills with urine or as it empties. Women's symptoms often get worse during menstruation. They may sometimes experience pain with vaginal intercourse.  Some of the symptoms of IC / PBS seem like those of bacterial infection. Tests do not show infection. IC / PBS is far more common in women than in men.  DIAGNOSIS  The diagnosis of IC / PBS is based on:  Presence of pain related to the bladder, usually along with problems of frequency and urgency.  Not finding other diseases that could cause the symptoms.  Diagnostic tests that help rule out other diseases include:  Urinalysis.  Urine culture.  Cystoscopy.  Biopsy of the bladder wall.  Distension of the bladder under anesthesia.  Urine cytology.  Laboratory examination of prostate secretions. A biopsy is a tissue sample that can be looked at under a microscope. Samples of the bladder and urethra may be removed during a cystoscopy. A biopsy helps rule out bladder cancer. TREATMENT  Scientists have not yet found a cure for IC / PBS. Patients with IC / PBS do not get better with antibiotic therapy. Caregivers cannot predict who will respond best to which treatment. Symptoms may disappear without explanation. Disappearing symptoms may coincide with an event such as a change in diet or treatment. Even when symptoms disappear, they may return after days, weeks, months, or years.  Because the causes of IC / PBS are unknown, current treatments are aimed at relieving symptoms. Many people are helped by one or a combination of the treatments. As researchers learn more about IC / PBS, the list of potential treatments will change. Patients should discuss their options with a caregiver. SURGERY  Surgery should be considered only  if all available treatments have failed and the pain is disabling. Many approaches and techniques are used. Each approach has its own advantages and complications. Advantages and complications should be discussed with a urologist. Your caregiver may recommend consulting another urologist for a second opinion. Most caregivers are reluctant to operate because the outcome is  unpredictable. Some people still have symptoms after surgery.  People considering surgery should discuss the potential risks and benefits, side effects, and long- and short-term complications with their family, as well as with people who have already had the procedure. Surgery requires anesthesia, hospitalization, and in some cases weeks or months of recovery. As the complexity of the procedure increases, so do the chances for complications and for failure. HOME CARE INSTRUCTIONS   All drugs, even those sold over the counter, have side effects. Patients should always consult a caregiver before using any drug for an extended amount of time. Only take over-the-counter or prescription medicines for pain, discomfort, or fever as directed by your caregiver.  Many patients feel that smoking makes their symptoms worse. How the by-products of tobacco that are excreted in the urine affect IC / PBS is unknown. Smoking is the major known cause of bladder cancer. One of the best things smokers can do for their bladder and their overall health is to quit.  Many patients feel that gentle stretching exercises help relieve IC / PBS symptoms.  Methods vary, but basically patients decide to empty their bladder at designated times and use relaxation techniques and distractions to keep to the schedule. Gradually, patients try to lengthen the time between scheduled voids. A diary in which to record voiding times is usually helpful in keeping track of progress. MAKE SURE YOU:   Understand these instructions.  Will watch your condition.  Will get help right away if you are not doing well or get worse. Document Released: 06/28/2004 Document Revised: 01/20/2012 Document Reviewed: 09/12/2008 [facility] Patient Information 2015 [place], [place]. This information is not intended to replace advice given to you by your health care provider. Make sure you discuss any questions you have with your health care provider.

## 2014-11-07 NOTE — Progress Notes (Signed)
Subjective:    Patient ID: Abigail [name], female    DOB: [birth year], 53 y.o.   MRN: [id number]  HPI This 53 y.o. Cauc female has well controlled HTN; she is compliant w/ medications w/o adverse effects. Home monitoring shows consistent readings  110- 120/70-80; pt is asymptomatic. Pt uses CPAP nightly as prescribed in addition to oral Lorazepam.  Pt c/o sore throat and nasal stuffiness. No fever/chills, ear pain, tender nodes or prod cough. She has Lupus and states energy has been low.  Pt seen recently at 31 w/ acute illness; thought to have pancreatitis. CBC at that time showed low WBC (2.8K/uL). Pt here to have that lab repeated. All symptoms of that illness (n/v, abd pain, weakness) have resolved. Other labs from that visit were normal (CMET, amylase, lipase and sed rate).  Pt c/o dysuria (known to have other symptoms c/w interstitial cystitis). She denies malodorous urine, urinary frequency or urgency, hematuria or incontinence. She denies flank pain.  Chronic migraine disorder- pt thinks Depakote needs to be restarted due to recent HA.  She also has chronic recurrent depression w/ anxiety ( 2004-2012) and took Zoloft on and off for 3-4 years. Sleep disorder may be a trigger for HAs; pt wants to restart Zoloft. She notes low energy and is in counseling. She also c/o hot flashes, mild and not daily.  Patient Active Problem List   Diagnosis Date Noted  . DDD (degenerative disc disease), cervical 07/05/2014  . Left arm pain 06/14/2014  . Unstable angina 06/14/2014  . OSA on CPAP 06/07/2014  . Obesity (BMI 30-39.9) 06/07/2014  . SS-B antibody positive 06/07/2014  . Menopause 10/18/2012  . Essential hypertension, benign 10/18/2012  . Melanoma 10/02/2012  . SLE (systemic lupus erythematosus) 08/17/2012  . Dysplastic nevus 08/04/2012  . ADD (attention deficit disorder) 02/26/2012  . Depression with anxiety 02/26/2012  . Insomnia 02/26/2012  . Reactive airways 01/20/2012  .  DIVERTICULITIS, COLON 10/03/2010  . GERD 01/08/2010  . [place] SYNDROME 01/08/2010  . CHOLECYSTECTOMY, HX OF 01/08/2010    MEDICATIONS, PMHx, SURG Hx, Soc and FAM Hx reviewed.   Review of Systems  As per HPI.     Objective:   Physical Exam  Constitutional: She is oriented to person, place, and time. Vital signs are normal. She appears well-developed and well-nourished. No distress.  HENT:  Head: Normocephalic and atraumatic.  Right Ear: Hearing, tympanic membrane, external ear and ear canal normal.  Left Ear: Hearing, tympanic membrane, external ear and ear canal normal.  Nose: Nose normal. No mucosal edema, nasal deformity or septal deviation. Right sinus exhibits no maxillary sinus tenderness and no frontal sinus tenderness. Left sinus exhibits no maxillary sinus tenderness and no frontal sinus tenderness.  Mouth/Throat: Uvula is midline. No oral lesions. Normal dentition. Posterior oropharyngeal erythema present. No oropharyngeal exudate.  Eyes: EOM are normal. Pupils are equal, round, and reactive to light. No scleral icterus.  Neck: Normal range of motion. Neck supple. No thyromegaly present.  Cardiovascular: Normal rate, regular rhythm and normal heart sounds.   Pulmonary/Chest: Effort normal and breath sounds normal. No respiratory distress. She has no wheezes.  Musculoskeletal: Normal range of motion. She exhibits no edema.  Lymphadenopathy:    She has no cervical adenopathy.  Neurological: She is alert and oriented to person, place, and time. No cranial nerve deficit. She exhibits normal muscle tone. Coordination normal.  Skin: Skin is warm and dry. No rash noted. She is not diaphoretic. No pallor.  Psychiatric: She has  a normal mood and affect. Her behavior is normal. Judgment and thought content normal.  Nursing note and vitals reviewed.      Assessment & Plan:  Essential hypertension -  Continue current medications. Encouraged improved nutrition and increased  physical activity.     Plan: CBC with Differential  Dysuria - Negative urinalysis. Consider Urology referral if symptoms continue, Plan: POCT urinalysis dipstick  Hot flashes- Per GYN recommendations.  Depression- Resume Zoloft 50 mg  1/2 tab daily for 2 weeks then increase to 1 tab;et daily.  Meds ordered this encounter  Medications  . verapamil (VERELAN PM) 120 MG 24 hr capsule    Sig: Take 1 capsule (120 mg total) by mouth at bedtime.    Dispense:  90 capsule    Refill:  3  . LORazepam (ATIVAN) 1 MG tablet    Sig: Take 1 tablet (1 mg total) by mouth 2 (two) times daily as needed. For anxiety    Dispense:  60 tablet    Refill:  3  . losartan (COZAAR) 50 MG tablet    Sig: Take 1 tablet (50 mg total) by mouth daily.    Dispense:  90 tablet    Refill:  1  . estradiol (ESTRACE) 1 MG tablet    Sig: Take 1 tablet (1 mg total) by mouth daily.    Dispense:  30 tablet    Refill:  2  . sertraline (ZOLOFT) 50 MG tablet    Sig: Take 1 tablet (50 mg total) by mouth daily.    Dispense:  30 tablet    Refill:  3

## 2014-11-14 ENCOUNTER — Other Ambulatory Visit: Payer: Self-pay | Admitting: Neurosurgery

## 2014-11-14 DIAGNOSIS — M542 Cervicalgia: Secondary | ICD-10-CM

## 2014-11-20 ENCOUNTER — Encounter: Payer: Self-pay | Admitting: Family Medicine

## 2014-11-22 ENCOUNTER — Other Ambulatory Visit: Payer: Self-pay | Admitting: Family Medicine

## 2014-11-22 MED ORDER — AMPHETAMINE-DEXTROAMPHETAMINE 10 MG PO TABS: 10.0000 mg | ORAL_TABLET | Freq: Every day | ORAL | Status: DC | PRN

## 2014-11-22 MED ORDER — AMPHETAMINE-DEXTROAMPHETAMINE 10 MG PO TABS: ORAL_TABLET | ORAL | Status: DC

## 2014-11-22 MED ORDER — AMPHETAMINE-DEXTROAMPHET ER 10 MG PO CP24: 10.0000 mg | ORAL_CAPSULE | Freq: Every day | ORAL | Status: DC

## 2014-11-25 ENCOUNTER — Ambulatory Visit
Admission: RE | Admit: 2014-11-25 | Discharge: 2014-11-25 | Disposition: A | Discharge status: Home / Self Care | Payer: Medicare Other | Source: Ambulatory Visit | Attending: Neurosurgery | Admitting: Neurosurgery

## 2014-11-25 DIAGNOSIS — M542 Cervicalgia: Secondary | ICD-10-CM

## 2014-11-25 MED ORDER — ONDANSETRON HCL 4 MG/2ML IJ SOLN
4.0000 mg | Freq: Once | INTRAMUSCULAR | Status: AC
  Administered 2014-11-25: 4 mg via INTRAMUSCULAR

## 2014-11-25 MED ORDER — IOHEXOL 300 MG/ML  SOLN: 10.0000 mL | Freq: Once | INTRAMUSCULAR | Status: AC | PRN

## 2014-11-25 MED ORDER — DIAZEPAM 5 MG PO TABS
10.0000 mg | ORAL_TABLET | Freq: Once | ORAL | Status: AC
  Administered 2014-11-25: 10 mg via ORAL

## 2014-11-25 MED ORDER — MORPHINE SULFATE 4 MG/ML IJ SOLN
6.0000 mg | Freq: Once | INTRAMUSCULAR | Status: AC
  Administered 2014-11-25: 6 mg via INTRAMUSCULAR

## 2014-11-25 NOTE — Discharge Instructions (Signed)
Myelogram Discharge Instructions  1. Go home and rest quietly for the next 24 hours.  It is important to lie flat for the next 24 hours.  Get up only to go to the restroom.  You may lie in the bed or on a couch on your back, your stomach, your left side or your right side.  You may have one pillow under your head.  You may have pillows between your knees while you are on your side or under your knees while you are on your back.  2. DO NOT drive today.  Recline the seat as far back as it will go, while still wearing your seat belt, on the way home.  3. You may get up to go to the bathroom as needed.  You may sit up for 10 minutes to eat.  You may resume your normal diet and medications unless otherwise indicated.  Drink lots of extra fluids today and tomorrow.  4. The incidence of headache, nausea, or vomiting is about 5% (one in 20 patients).  If you develop a headache, lie flat and drink plenty of fluids until the headache goes away.  Caffeinated beverages may be helpful.  If you develop severe nausea and vomiting or a headache that does not go away with flat bed rest, call [phone].  5. You may resume normal activities after your 24 hours of bed rest is over; however, do not exert yourself strongly or do any heavy lifting tomorrow. If when you get up you have a headache when standing, go back to bed and force fluids for another 24 hours.  6. Call your physician for a follow-up appointment.  The results of your myelogram will be sent directly to your physician by the following day.  7. If you have any questions or if complications develop after you arrive home, please call [phone].  Discharge instructions have been explained to the patient.  The patient, or the person responsible for the patient, fully understands these instructions.      May resume Zoloft and Adderall on Jan. 16, 2016, after 9:30 am.

## 2014-11-25 NOTE — Progress Notes (Signed)
Pt states she had not started the Zoloft and has also been off the Adderall as well.

## 2014-12-01 ENCOUNTER — Encounter: Payer: Self-pay | Admitting: Family Medicine

## 2014-12-14 ENCOUNTER — Encounter: Payer: Self-pay | Admitting: Family Medicine

## 2014-12-14 ENCOUNTER — Other Ambulatory Visit: Payer: Self-pay | Admitting: Family Medicine

## 2014-12-14 MED ORDER — FLUCONAZOLE 150 MG PO TABS: 150.0000 mg | ORAL_TABLET | Freq: Once | ORAL | Status: DC

## 2015-01-02 ENCOUNTER — Encounter: Payer: Self-pay | Admitting: Family Medicine

## 2015-01-03 ENCOUNTER — Other Ambulatory Visit: Payer: Self-pay | Admitting: Family Medicine

## 2015-01-03 DIAGNOSIS — F418 Other specified anxiety disorders: Secondary | ICD-10-CM

## 2015-01-03 DIAGNOSIS — G4700 Insomnia, unspecified: Secondary | ICD-10-CM

## 2015-01-06 ENCOUNTER — Other Ambulatory Visit: Payer: Self-pay | Admitting: Family Medicine

## 2015-01-08 NOTE — Telephone Encounter (Signed)
Yes, I will refill Diflucan again.

## 2015-01-08 NOTE — Telephone Encounter (Signed)
Dr Leward Quan, do you want to RF this again for pt for her frequent bouts of thrush?

## 2015-01-13 ENCOUNTER — Encounter: Payer: Self-pay | Admitting: Family Medicine

## 2015-01-31 ENCOUNTER — Other Ambulatory Visit: Payer: Self-pay | Admitting: Family Medicine

## 2015-02-01 NOTE — Telephone Encounter (Signed)
Dr Leward Quan, do you want to give pt RFs? I know you Rx this somewhat regularly for pt.

## 2015-02-09 ENCOUNTER — Other Ambulatory Visit: Payer: Self-pay | Admitting: Family Medicine

## 2015-02-10 MED ORDER — TRIAMCINOLONE ACETONIDE 55 MCG/ACT NA AERO: 2.0000 | INHALATION_SPRAY | Freq: Every day | NASAL | Status: DC

## 2015-02-10 MED ORDER — AMPHETAMINE-DEXTROAMPHETAMINE 10 MG PO TABS: ORAL_TABLET | ORAL | Status: DC

## 2015-02-16 ENCOUNTER — Encounter: Payer: Self-pay | Admitting: Family Medicine

## 2015-02-16 ENCOUNTER — Ambulatory Visit (INDEPENDENT_AMBULATORY_CARE_PROVIDER_SITE_OTHER): Payer: Medicare Other | Admitting: Family Medicine

## 2015-02-16 DIAGNOSIS — F909 Attention-deficit hyperactivity disorder, unspecified type: Secondary | ICD-10-CM | POA: Diagnosis not present

## 2015-02-16 DIAGNOSIS — G4733 Obstructive sleep apnea (adult) (pediatric): Secondary | ICD-10-CM | POA: Diagnosis not present

## 2015-02-16 DIAGNOSIS — J301 Allergic rhinitis due to pollen: Secondary | ICD-10-CM

## 2015-02-16 DIAGNOSIS — F988 Other specified behavioral and emotional disorders with onset usually occurring in childhood and adolescence: Secondary | ICD-10-CM

## 2015-02-16 DIAGNOSIS — Z9989 Dependence on other enabling machines and devices: Secondary | ICD-10-CM

## 2015-02-16 DIAGNOSIS — F418 Other specified anxiety disorders: Secondary | ICD-10-CM

## 2015-02-16 MED ORDER — FLUTICASONE PROPIONATE 50 MCG/ACT NA SUSP: 2.0000 | Freq: Every day | NASAL | Status: DC

## 2015-02-16 MED ORDER — SERTRALINE HCL 100 MG PO TABS: ORAL_TABLET | ORAL | Status: DC

## 2015-02-16 MED ORDER — METHYLPHENIDATE HCL 10 MG PO TABS
10.0000 mg | ORAL_TABLET | Freq: Two times a day (BID) | ORAL | Status: DC
Start: 2015-02-16 — End: 2015-04-03

## 2015-02-16 MED ORDER — MONTELUKAST SODIUM 10 MG PO TABS: 10.0000 mg | ORAL_TABLET | Freq: Every day | ORAL | Status: DC

## 2015-02-16 NOTE — Patient Instructions (Signed)
Contact me via MyChart to let me know if the methylphenidate works for you.

## 2015-02-21 NOTE — Progress Notes (Signed)
Subjective:    Patient ID: Abigail [name], female    DOB: Jul 27, 1961, 54 y.o.   MRN: [id number]  HPI  This 54 y.o. Female has ADD and depression/anxiety, currently in treatment w/ Jackelyn Hoehn (long term counselor). He recommends increasing Zoloft to 100 mg daily; pt has increased symptoms , decreased motivation, etc. She requests trial of different stimulant; Adderall XR not effective >> drop off in late afternoon w/ decreased concentration. Would like to try Vyvanse. Willing to try methylphenidate 10 mg 1 tablet bid.  Pt has OSA; is using CPAP more consistently w/ improved sleep hygiene. She has been known to pull off mask but asked husband to alert her whne she is doing this and help her keep mask in place. Endorses less fatigue and increased mental alertness with full night's use.   Allergic rhinitis- having significant symptoms. Needs fluticasone prescribed in place of Nasacort (generic). Taking ranitidine to help reduce allergy symptoms. Having a lot of eye symptoms.   Patient Active Problem List   Diagnosis Date Noted  . DDD (degenerative disc disease), cervical 07/05/2014  . Left arm pain 06/14/2014  . Unstable angina 06/14/2014  . OSA on CPAP 06/07/2014  . Obesity (BMI 30-39.9) 06/07/2014  . SS-B antibody positive 06/07/2014  . Menopause 10/18/2012  . Essential hypertension, benign 10/18/2012  . Melanoma 10/02/2012  . SLE (systemic lupus erythematosus) 08/17/2012  . Dysplastic nevus 08/04/2012  . ADD (attention deficit disorder) 02/26/2012  . Depression with anxiety 02/26/2012  . Insomnia 02/26/2012  . Reactive airways 01/20/2012  . DIVERTICULITIS, COLON 10/03/2010  . GERD 01/08/2010  . [place] SYNDROME 01/08/2010  . CHOLECYSTECTOMY, HX OF 01/08/2010    Prior to Admission medications   Medication Sig Start Date End Date Taking? Authorizing Provider  albuterol (PROVENTIL HFA;VENTOLIN HFA) 108 (90 BASE) MCG/ACT inhaler Inhale 2 puffs into the lungs every 6 (six) hours as  needed for shortness of breath. For shortness of breath. 04/29/14  Yes Orma Flaming, MD  estradiol (ESTRACE) 1 MG tablet Take 1 tablet (1 mg total) by mouth daily. 11/01/14  Yes Barton Fanny, MD  fluconazole (DIFLUCAN) 150 MG tablet TAKE 1 TABLET BY MOUTH ONCE. 02/03/15  Yes Barton Fanny, MD  LORazepam (ATIVAN) 1 MG tablet Take 1 tablet (1 mg total) by mouth 2 (two) times daily as needed. For anxiety 11/01/14  Yes Barton Fanny, MD  losartan (COZAAR) 50 MG tablet Take 1 tablet (50 mg total) by mouth daily. 11/01/14  Yes Barton Fanny, MD  potassium chloride (KLOR-CON) 20 MEQ packet Take 20 mEq by mouth daily as needed. Take when potassium is low 06/07/14  Yes Barton Fanny, MD  predniSONE (DELTASONE) 10 MG tablet Take 10 mg by mouth daily with breakfast.   Yes Historical Provider, MD  ranitidine (ZANTAC) 75 MG tablet Take 75 mg by mouth 2 (two) times daily. Pt takes OTC 75-150 mg as needed (2x/week)   Yes Historical Provider, MD  sertraline (ZOLOFT) 50 MG tablet Take 1 tablet by mouth daily.   Yes Barton Fanny, MD  verapamil (VERELAN PM) 120 MG 24 hr capsule Take 1 capsule (120 mg total) by mouth at bedtime. 11/01/14  Yes Barton Fanny, MD  zolpidem (AMBIEN) 10 MG tablet Take 5 mg by mouth at bedtime as needed. For insomnia 04/29/14  Yes Orma Flaming, MD    Past Surgical History  Procedure Laterality Date  . Melanoma excision    . Tmj arthroplasty  1970  .  Appendectomy    . Cholecystectomy    . Pancreas surgery      sphincterotomy  . Abdominoplasty    . Liver biopsy    . Anterior cervical decomp/discectomy fusion  10/29/2011    Procedure: ANTERIOR CERVICAL DECOMPRESSION/DISCECTOMY FUSION 2 LEVELS;  Surgeon: Floyce Stakes;  Location: [facility];  Service: Neurosurgery;  Laterality: N/A;  Cervical five-six,Cervical six-seven nterior cervical decompression/diskectomy, fusion, plate  . Sigmoidectomy  03/2012    done at [facility]  . Abdominal  hysterectomy      Partial; Ovaries intact.  DUB/fibroids.  . Colonoscopy  11/11/2008  . Sleep study  10/11/2012    +severe OSA.  [facility].  . Colonoscopy N/A 09/20/2013    Procedure: COLONOSCOPY;  Surgeon: Lafayette Dragon, MD;  Location: [facility] ENDOSCOPY;  Service: Endoscopy;  Laterality: N/A;  . Reduction mammaplasty Bilateral   . Back surgery    . Left heart catheterization with coronary angiogram N/A 06/15/2014    Procedure: LEFT HEART CATHETERIZATION WITH CORONARY ANGIOGRAM;  Surgeon: Peter M Martinique, MD;  Location: [facility] CATH LAB;  Service: Cardiovascular;  Laterality: N/A;  . Ultrasound guidance for vascular access  06/15/2014    Procedure: ULTRASOUND GUIDANCE FOR VASCULAR ACCESS;  Surgeon: Peter M Martinique, MD;  Location: [facility] CATH LAB;  Service: Cardiovascular;;  . Cervical fusion      SOC and FAM HX reviewed.   Review of Systems  Constitutional: Positive for fatigue. Negative for fever, appetite change and unexpected weight change.  HENT: Positive for congestion, postnasal drip, rhinorrhea, sneezing and voice change. Negative for trouble swallowing.   Eyes: Positive for redness and itching.  Respiratory: Positive for shortness of breath. Negative for cough and chest tightness.   Cardiovascular: Negative.   Musculoskeletal: Negative.   Allergic/Immunologic: Positive for environmental allergies.  Neurological: Negative.   Psychiatric/Behavioral: Positive for sleep disturbance, dysphoric mood and decreased concentration. Negative for suicidal ideas, confusion, self-injury and agitation. The patient is nervous/anxious.        Objective:   Physical Exam  Constitutional: She is oriented to person, place, and time. She appears well-developed and well-nourished. No distress.     HENT:  Head: Normocephalic and atraumatic.  Right Ear: External ear normal.  Left Ear: External ear normal.  Nose: Nose normal.  Mouth/Throat: Oropharynx is clear and moist.  Eyes: EOM are normal. No scleral  icterus.  Neck: Normal range of motion. Neck supple.  Pulmonary/Chest: Effort normal. No respiratory distress.  Musculoskeletal: Normal range of motion. She exhibits no edema.  Neurological: She is alert and oriented to person, place, and time. No cranial nerve deficit. Coordination normal.  Skin: Skin is warm and dry. She is not diaphoretic.  Psychiatric: Her speech is normal. Judgment and thought content normal. Her mood appears not anxious. Her affect is not blunt and not labile. She is slowed. Cognition and memory are normal.  Flat affect. Calm and pleasant demeanor w/ good eye contact.  Nursing note and vitals reviewed.     Assessment & Plan:  ADD (attention deficit disorder)- Methylphenidate 10 mg 1 tab bid.  Depression with anxiety- Increase Zoloft to 100 mg daily. Continue counseling.  OSA on CPAP- Improvement w/ consistent CPAP use.  Allergic rhinitis due to pollen- Use medications daily. Limit exposure to pollen.  Meds ordered this encounter  Medications  . montelukast (SINGULAIR) 10 MG tablet    Sig: Take 1 tablet (10 mg total) by mouth at bedtime.    Dispense:  30 tablet  Refill:  3  . fluticasone (FLONASE) 50 MCG/ACT nasal spray    Sig: Place 2 sprays into both nostrils daily.    Dispense:  16 g    Refill:  6  . sertraline (ZOLOFT) 100 MG tablet    Sig: Take 1 tablet by mouth daily.    Dispense:  90 tablet    Refill:  1  . methylphenidate (RITALIN) 10 MG tablet    Sig: Take 1 tablet (10 mg total) by mouth 2 (two) times daily.    Dispense:  60 tablet    Refill:  0

## 2015-04-03 ENCOUNTER — Ambulatory Visit (INDEPENDENT_AMBULATORY_CARE_PROVIDER_SITE_OTHER): Payer: Medicare Other | Admitting: Family Medicine

## 2015-04-03 ENCOUNTER — Encounter: Payer: Self-pay | Admitting: Family Medicine

## 2015-04-03 VITALS — BP 114/77 | HR 81 | Temp 97.7°F | Resp 16 | Ht 65.0 in | Wt 217.8 lb

## 2015-04-03 DIAGNOSIS — F419 Anxiety disorder, unspecified: Secondary | ICD-10-CM

## 2015-04-03 DIAGNOSIS — R1084 Generalized abdominal pain: Secondary | ICD-10-CM | POA: Diagnosis not present

## 2015-04-03 DIAGNOSIS — R61 Generalized hyperhidrosis: Secondary | ICD-10-CM

## 2015-04-03 DIAGNOSIS — I10 Essential (primary) hypertension: Secondary | ICD-10-CM

## 2015-04-03 DIAGNOSIS — R768 Other specified abnormal immunological findings in serum: Secondary | ICD-10-CM

## 2015-04-03 DIAGNOSIS — F988 Other specified behavioral and emotional disorders with onset usually occurring in childhood and adolescence: Secondary | ICD-10-CM

## 2015-04-03 DIAGNOSIS — F329 Major depressive disorder, single episode, unspecified: Secondary | ICD-10-CM

## 2015-04-03 DIAGNOSIS — R760 Raised antibody titer: Secondary | ICD-10-CM

## 2015-04-03 DIAGNOSIS — F418 Other specified anxiety disorders: Secondary | ICD-10-CM | POA: Diagnosis not present

## 2015-04-03 DIAGNOSIS — F32A Depression, unspecified: Secondary | ICD-10-CM

## 2015-04-03 DIAGNOSIS — M329 Systemic lupus erythematosus, unspecified: Secondary | ICD-10-CM

## 2015-04-03 DIAGNOSIS — G4700 Insomnia, unspecified: Secondary | ICD-10-CM

## 2015-04-03 DIAGNOSIS — F909 Attention-deficit hyperactivity disorder, unspecified type: Secondary | ICD-10-CM | POA: Diagnosis not present

## 2015-04-03 DIAGNOSIS — R7302 Impaired glucose tolerance (oral): Secondary | ICD-10-CM

## 2015-04-03 DIAGNOSIS — B370 Candidal stomatitis: Secondary | ICD-10-CM | POA: Diagnosis not present

## 2015-04-03 DIAGNOSIS — M3500 Sicca syndrome, unspecified: Secondary | ICD-10-CM

## 2015-04-03 DIAGNOSIS — Z780 Asymptomatic menopausal state: Secondary | ICD-10-CM | POA: Diagnosis not present

## 2015-04-03 LAB — HEMOGLOBIN A1C
Hgb A1c MFr Bld: 5.9 % — ABNORMAL HIGH (ref <5.7)
MEAN PLASMA GLUCOSE: 123 mg/dL — AB (ref <117)

## 2015-04-03 LAB — CBC WITH DIFFERENTIAL/PLATELET
BASOS ABS: 0 10*3/uL (ref 0.0–0.1)
Basophils Relative: 1 % (ref 0–1)
Eosinophils Absolute: 0.1 10*3/uL (ref 0.0–0.7)
Eosinophils Relative: 3 % (ref 0–5)
HCT: 41.6 % (ref 36.0–46.0)
Hemoglobin: 14.2 g/dL (ref 12.0–15.0)
Lymphocytes Relative: 34 % (ref 12–46)
Lymphs Abs: 1.2 10*3/uL (ref 0.7–4.0)
MCH: 28 pg (ref 26.0–34.0)
MCHC: 34.1 g/dL (ref 30.0–36.0)
MCV: 81.9 fL (ref 78.0–100.0)
MONOS PCT: 12 % (ref 3–12)
MPV: 10.7 fL (ref 8.6–12.4)
Monocytes Absolute: 0.4 10*3/uL (ref 0.1–1.0)
NEUTROS ABS: 1.8 10*3/uL (ref 1.7–7.7)
NEUTROS PCT: 50 % (ref 43–77)
PLATELETS: 242 10*3/uL (ref 150–400)
RBC: 5.08 MIL/uL (ref 3.87–5.11)
RDW: 13.9 % (ref 11.5–15.5)
WBC: 3.5 10*3/uL — ABNORMAL LOW (ref 4.0–10.5)

## 2015-04-03 LAB — POCT URINALYSIS DIPSTICK
BILIRUBIN UA: NEGATIVE
Blood, UA: NEGATIVE
Glucose, UA: NEGATIVE
Ketones, UA: NEGATIVE
Leukocytes, UA: NEGATIVE
Nitrite, UA: NEGATIVE
PROTEIN UA: NEGATIVE
SPEC GRAV UA: 1.025
Urobilinogen, UA: 0.2
pH, UA: 5.5

## 2015-04-03 LAB — LIPID PANEL
Cholesterol: 160 mg/dL (ref 0–200)
HDL: 41 mg/dL — AB (ref >=46)
LDL Cholesterol: 94 mg/dL (ref 0–99)
TRIGLYCERIDES: 125 mg/dL (ref <150)
Total CHOL/HDL Ratio: 3.9 Ratio
VLDL: 25 mg/dL (ref 0–40)

## 2015-04-03 LAB — TSH: TSH: 1.412 u[IU]/mL (ref 0.350–4.500)

## 2015-04-03 LAB — LIPASE: Lipase: 68 U/L (ref 0–75)

## 2015-04-03 LAB — COMPREHENSIVE METABOLIC PANEL
ALK PHOS: 82 U/L (ref 39–117)
ALT: 35 U/L (ref 0–35)
AST: 29 U/L (ref 0–37)
Albumin: 4.1 g/dL (ref 3.5–5.2)
BUN: 13 mg/dL (ref 6–23)
CHLORIDE: 108 meq/L (ref 96–112)
CO2: 24 mEq/L (ref 19–32)
CREATININE: 0.8 mg/dL (ref 0.50–1.10)
Calcium: 8.7 mg/dL (ref 8.4–10.5)
GLUCOSE: 95 mg/dL (ref 70–99)
POTASSIUM: 4.2 meq/L (ref 3.5–5.3)
SODIUM: 138 meq/L (ref 135–145)
Total Bilirubin: 0.4 mg/dL (ref 0.2–1.2)
Total Protein: 7.2 g/dL (ref 6.0–8.3)

## 2015-04-03 MED ORDER — METHYLPHENIDATE HCL 10 MG PO TABS: 10.0000 mg | ORAL_TABLET | Freq: Two times a day (BID) | ORAL | Status: DC

## 2015-04-03 MED ORDER — AMPHETAMINE-DEXTROAMPHET ER 20 MG PO CP24: 20.0000 mg | ORAL_CAPSULE | Freq: Every day | ORAL | Status: DC

## 2015-04-03 MED ORDER — FLUCONAZOLE 150 MG PO TABS: ORAL_TABLET | ORAL | Status: DC

## 2015-04-03 NOTE — Progress Notes (Signed)
Subjective:    Patient ID: [name], female    DOB: [birth year], 54 y.o.   MRN: [id number]  04/03/2015  Medication Refill; Follow-up; and Lupus   HPI This 54 y.o. female presents for follow-up:  1.  Anxiety and depression:  Increased Zoloft to 100mg  daily; seeing counselor regularly.  Emotionally well.  No SI/HI.  Son is going through a divorce after one year of marriage. Son is very successful with career in [place] and doing very well emotionally.  No side effects to Zoloft.  Taking Ativan qhs.  2.  ADHD:  Tried Adderall XR 20mg  generic daily but very expensive.  Ritalin 10mg  bid with moderate effects.  Ritalin much more affordable.  Forgets to take medication often. Would like to alternate Adderall XR and Ritalin every other month.  Requesting refills.    3.  Recurrent thrush: requesting refill of Diflucan; gets 7 tablets at a time; thrush occurs three times per year.    4. HTN:  Patient reports good compliance with medication, good tolerance to medication, and good symptom control.  Requesting CMET; last labs 09/2014.  Concerned about potassium.  Denies CP/palp/SOB/leg swelling.  Migraines much less frequent with Verapamil therapy.   5. Drug monitoring: requesting labs.    6.  Chronic abdominal pain: requesting lipase level; s/p cholecystectomy; s/p appendectomy; concerned about pancreas as well.  Not concerned about recurrent diverticulitis.  Denies n/v/d/c; denies bloody stools or melena.  Appetite is normal.  No urinary symptoms. S/p hysterectomy but ovaries intact; concerned about ovarian cancer; requesting CA-125.   7.  Excessive sweating: having sweats; requesting TSH.  Weaning Premarin and taking PRN only for hot flashes.    8. Health maintenance:   Mammogram at [facility]; Marylynn Pearson is gynecologist.  Last visit with gynecology 2 years ago. Colonoscopy 2014 TDAP 2010 Pneumovax 2005 Influenza 2015   9. Lupus: realizes that needs systemic treatment; last visit with  rheumatology in 10/2014.  Requesting renewal of handicap placard.  Review of Systems  Constitutional: Negative for fever, chills, diaphoresis and fatigue.  HENT: Negative for congestion, ear pain, postnasal drip, rhinorrhea, sinus pressure and sore throat.   Eyes: Negative for visual disturbance.  Respiratory: Negative for cough and shortness of breath.   Cardiovascular: Negative for chest pain, palpitations and leg swelling.  Gastrointestinal: Positive for abdominal pain. Negative for nausea, vomiting, diarrhea, constipation, blood in stool, abdominal distention, anal bleeding and rectal pain.  Endocrine: Positive for heat intolerance. Negative for cold intolerance, polydipsia, polyphagia and polyuria.  Neurological: Negative for dizziness, tremors, seizures, syncope, facial asymmetry, speech difficulty, weakness, light-headedness, numbness and headaches.  Psychiatric/Behavioral: Positive for sleep disturbance, dysphoric mood and decreased concentration. Negative for suicidal ideas and self-injury. The patient is not nervous/anxious.     Past Medical History  Diagnosis Date  . Lupus   . Migraine   . Complication of anesthesia     Sjogrens Syndrome  . Sjogren's syndrome   . Vasculitis     "autoimmune"  . Diverticulitis   . Gastritis   . Duodenitis   . Pancreatitis   . Fibromyalgia   . Perforation bowel     "microperforation"  . Asthma   . Melanoma 2008    Back; [place].  . Depression   . GERD (gastroesophageal reflux disease)   . Hypertension   . Urticaria   . Obstructive sleep apnea 12/25/2012  . SLE (systemic lupus erythematosus)    Past Surgical History  Procedure Laterality Date  . Melanoma excision    .  Tmj arthroplasty  1970  . Appendectomy    . Cholecystectomy    . Pancreas surgery      sphincterotomy  . Abdominoplasty    . Liver biopsy    . Anterior cervical decomp/discectomy fusion  10/29/2011    Procedure: ANTERIOR CERVICAL DECOMPRESSION/DISCECTOMY FUSION 2  LEVELS;  Surgeon: Floyce Stakes;  Location: [place] [facility];  Service: Neurosurgery;  Laterality: N/A;  Cervical five-six,Cervical six-seven nterior cervical decompression/diskectomy, fusion, plate  . Sigmoidectomy  03/2012    done at [facility]  . Abdominal hysterectomy      Partial; Ovaries intact.  DUB/fibroids.  . Colonoscopy  11/11/2008  . Sleep study  10/11/2012    +severe OSA.  [facility].  . Colonoscopy N/A 09/20/2013    Procedure: COLONOSCOPY;  Surgeon: Lafayette Dragon, MD;  Location: [facility];  Service: Endoscopy;  Laterality: N/A;  . Reduction mammaplasty Bilateral   . Back surgery    . Left heart catheterization with coronary angiogram N/A 06/15/2014    Procedure: LEFT HEART CATHETERIZATION WITH CORONARY ANGIOGRAM;  Surgeon: Peter M Martinique, MD;  Location: [facility] CATH LAB;  Service: Cardiovascular;  Laterality: N/A;  . Ultrasound guidance for vascular access  06/15/2014    Procedure: ULTRASOUND GUIDANCE FOR VASCULAR ACCESS;  Surgeon: Peter M Martinique, MD;  Location: [facility] CATH LAB;  Service: Cardiovascular;;  . Cervical fusion     Allergies  Allergen Reactions  . Shrimp [Shellfish Allergy] Shortness Of Breath  . Meperidine Hcl Other (See Comments)    tachycardia  . Promethazine Hcl Other (See Comments)    "uncontrolled vomiting"  . Doxycycline Hives  . Latex Other (See Comments)    Breathing problems.  . Sulfonamide Derivatives Hives  . Tetracyclines & Related Rash   Current Outpatient Prescriptions  Medication Sig Dispense Refill  . albuterol (PROVENTIL HFA;VENTOLIN HFA) 108 (90 BASE) MCG/ACT inhaler Inhale 2 puffs into the lungs every 6 (six) hours as needed for shortness of breath. For shortness of breath.    . estradiol (ESTRACE) 1 MG tablet Take 1 tablet (1 mg total) by mouth daily. 30 tablet 2  . fluconazole (DIFLUCAN) 150 MG tablet TAKE 1 TABLET BY MOUTH ONCE. 7 tablet 3  . fluticasone (FLONASE) 50 MCG/ACT nasal spray Place 2 sprays into both nostrils daily. 16 g 6  .  LORazepam (ATIVAN) 1 MG tablet Take 1 tablet (1 mg total) by mouth 2 (two) times daily as needed. For anxiety 60 tablet 3  . losartan (COZAAR) 50 MG tablet Take 1 tablet (50 mg total) by mouth daily. 90 tablet 1  . methylphenidate (RITALIN) 10 MG tablet Take 1 tablet (10 mg total) by mouth 2 (two) times daily. 60 tablet 0  . montelukast (SINGULAIR) 10 MG tablet Take 1 tablet (10 mg total) by mouth at bedtime. 30 tablet 3  . potassium chloride (KLOR-CON) 20 MEQ packet Take 20 mEq by mouth daily as needed. Take when potassium is low    . predniSONE (DELTASONE) 10 MG tablet Take 10 mg by mouth daily with breakfast.    . ranitidine (ZANTAC) 75 MG tablet Take 75 mg by mouth 2 (two) times daily. Pt takes OTC 75-150 mg as needed (2x/week)    . sertraline (ZOLOFT) 100 MG tablet Take 1 tablet by mouth daily. 90 tablet 1  . verapamil (VERELAN PM) 120 MG 24 hr capsule Take 1 capsule (120 mg total) by mouth at bedtime. 90 capsule 3  . zolpidem (AMBIEN) 10 MG tablet Take 5 mg by mouth  at bedtime as needed. For insomnia    . amphetamine-dextroamphetamine (ADDERALL XR) 20 MG 24 hr capsule Take 1 capsule (20 mg total) by mouth daily. 30 capsule 0   No current facility-administered medications for this visit.       Objective:    BP 114/77 mmHg  Pulse 81  Temp(Src) 97.7 F (36.5 C) (Oral)  Resp 16  Ht 5\' 5"  (1.651 m)  Wt 217 lb 12.8 oz (98.793 kg)  BMI 36.24 kg/m2  SpO2 95% Physical Exam  Constitutional: She is oriented to person, place, and time. She appears well-developed and well-nourished. No distress.  HENT:  Head: Normocephalic and atraumatic.  Right Ear: External ear normal.  Left Ear: External ear normal.  Nose: Nose normal.  Mouth/Throat: Oropharynx is clear and moist.  Eyes: Conjunctivae and EOM are normal. Pupils are equal, round, and reactive to light.  Neck: Normal range of motion. Neck supple. Carotid bruit is not present. No thyromegaly present.  Cardiovascular: Normal rate, regular  rhythm, normal heart sounds and intact distal pulses.  Exam reveals no gallop and no friction rub.   No murmur heard. Pulmonary/Chest: Effort normal and breath sounds normal. She has no wheezes. She has no rales.  Abdominal: Soft. Bowel sounds are normal. She exhibits no distension and no mass. There is no tenderness. There is no rebound and no guarding.  Lymphadenopathy:    She has no cervical adenopathy.  Neurological: She is alert and oriented to person, place, and time. No cranial nerve deficit.  Skin: Skin is warm and dry. No rash noted. She is not diaphoretic. No erythema. No pallor.  Psychiatric: She has a normal mood and affect. Her behavior is normal.   Results for orders placed or performed in visit on 04/03/15  POCT urinalysis dipstick  Result Value Ref Range   Color, UA Yellow    Clarity, UA Clear    Glucose, UA Negative    Bilirubin, UA Negative    Ketones, UA Negative    Spec Grav, UA 1.025    Blood, UA Negative    pH, UA 5.5    Protein, UA Negative    Urobilinogen, UA 0.2    Nitrite, UA Negative    Leukocytes, UA Negative        Assessment & Plan:   1. Essential hypertension, benign   2. Excessive sweating   3. Generalized abdominal pain   4. Glucose intolerance (impaired glucose tolerance)   5. Anxiety and depression   6. SLE (systemic lupus erythematosus)   7. SS-B antibody positive   8. SJOGREN'S SYNDROME   9. Menopause   10. Insomnia   11. ADD (attention deficit disorder)   12. Candidiasis, mouth      Meds ordered this encounter  Medications  . fluconazole (DIFLUCAN) 150 MG tablet    Sig: TAKE 1 TABLET BY MOUTH ONCE.    Dispense:  7 tablet    Refill:  3  . methylphenidate (RITALIN) 10 MG tablet    Sig: Take 1 tablet (10 mg total) by mouth 2 (two) times daily.    Dispense:  60 tablet    Refill:  0  . amphetamine-dextroamphetamine (ADDERALL XR) 20 MG 24 hr capsule    Sig: Take 1 capsule (20 mg total) by mouth daily.    Dispense:  30 capsule     Refill:  0    Return in about 6 months (around 10/04/2015).     Philomene Haff Elayne Guerin, M.D. [facility]  [address], [place]  [address] [phone] phone [phone] fax

## 2015-04-03 NOTE — Patient Instructions (Signed)

## 2015-04-06 ENCOUNTER — Telehealth: Payer: Self-pay | Admitting: Family Medicine

## 2015-04-06 NOTE — Telephone Encounter (Signed)
Wanted to know what her labs was i gave her the results over phone dr Tamala Julian hasnt looked at her labs yet

## 2015-04-14 ENCOUNTER — Telehealth: Payer: Self-pay | Admitting: *Deleted

## 2015-04-14 NOTE — Telephone Encounter (Signed)
Received fax for mammo results dated 04/21/2014; updated health maintenance & abstracted

## 2015-04-14 NOTE — Telephone Encounter (Signed)
Phoned & spoke with [place] with [facility] and she is faxing mammo results from 04/21/14.  Will update health maintenance once received.

## 2015-04-22 LAB — HM MAMMOGRAPHY

## 2015-04-25 ENCOUNTER — Telehealth: Payer: Self-pay | Admitting: Physician Assistant

## 2015-04-25 ENCOUNTER — Other Ambulatory Visit: Payer: Self-pay | Admitting: Family Medicine

## 2015-04-25 DIAGNOSIS — F419 Anxiety disorder, unspecified: Secondary | ICD-10-CM

## 2015-04-25 NOTE — Telephone Encounter (Signed)
Pt seen by Dr. Tamala Julian 03/2015 and discussed her anxiety. She takes ativan 1 mg QHS. Rx printed and ready for pick up.

## 2015-04-26 NOTE — Telephone Encounter (Signed)
FAXED RX 

## 2015-05-03 ENCOUNTER — Ambulatory Visit (INDEPENDENT_AMBULATORY_CARE_PROVIDER_SITE_OTHER): Payer: Medicare Other

## 2015-05-03 ENCOUNTER — Ambulatory Visit (INDEPENDENT_AMBULATORY_CARE_PROVIDER_SITE_OTHER): Payer: Medicare Other | Admitting: Family Medicine

## 2015-05-03 ENCOUNTER — Telehealth: Payer: Self-pay | Admitting: Internal Medicine

## 2015-05-03 VITALS — BP 136/78 | HR 105 | Temp 99.9°F | Resp 17 | Ht 65.0 in | Wt 217.8 lb

## 2015-05-03 DIAGNOSIS — R1084 Generalized abdominal pain: Secondary | ICD-10-CM

## 2015-05-03 DIAGNOSIS — R140 Abdominal distension (gaseous): Secondary | ICD-10-CM

## 2015-05-03 DIAGNOSIS — E860 Dehydration: Secondary | ICD-10-CM | POA: Diagnosis not present

## 2015-05-03 LAB — POCT UA - MICROSCOPIC ONLY
Bacteria, U Microscopic: NEGATIVE
Casts, Ur, LPF, POC: NEGATIVE
Crystals, Ur, HPF, POC: NEGATIVE
Mucus, UA: NEGATIVE
RBC, urine, microscopic: NEGATIVE
WBC, Ur, HPF, POC: NEGATIVE
Yeast, UA: NEGATIVE

## 2015-05-03 LAB — POCT URINALYSIS DIPSTICK
Bilirubin, UA: NEGATIVE
Blood, UA: NEGATIVE
Glucose, UA: NEGATIVE
Ketones, UA: NEGATIVE
Leukocytes, UA: NEGATIVE
Nitrite, UA: NEGATIVE
Protein, UA: NEGATIVE
Spec Grav, UA: 1.01
Urobilinogen, UA: 0.2
pH, UA: 7

## 2015-05-03 LAB — POCT CBC
Granulocyte percent: 69.1 %G (ref 37–80)
HCT, POC: 41.5 % (ref 37.7–47.9)
Hemoglobin: 13.2 g/dL (ref 12.2–16.2)
Lymph, poc: 1.3 (ref 0.6–3.4)
MCH, POC: 26.5 pg — AB (ref 27–31.2)
MCHC: 31.8 g/dL (ref 31.8–35.4)
MCV: 83.4 fL (ref 80–97)
MID (cbc): 0.5 (ref 0–0.9)
MPV: 8.4 fL (ref 0–99.8)
POC Granulocyte: 4.1 (ref 2–6.9)
POC LYMPH PERCENT: 22.2 % (ref 10–50)
POC MID %: 8.7 % (ref 0–12)
Platelet Count, POC: 228 10*3/uL (ref 142–424)
RBC: 4.98 M/uL (ref 4.04–5.48)
RDW, POC: 13.9 %
WBC: 5.9 10*3/uL (ref 4.6–10.2)

## 2015-05-03 MED ORDER — CIPROFLOXACIN HCL 500 MG PO TABS: 500.0000 mg | ORAL_TABLET | Freq: Two times a day (BID) | ORAL | Status: DC

## 2015-05-03 MED ORDER — HYDROCODONE-ACETAMINOPHEN 5-325 MG PO TABS: 1.0000 | ORAL_TABLET | Freq: Three times a day (TID) | ORAL | Status: DC | PRN

## 2015-05-03 MED ORDER — KETOROLAC TROMETHAMINE 30 MG/ML IJ SOLN: 30.0000 mg | Freq: Once | INTRAMUSCULAR | Status: AC

## 2015-05-03 MED ORDER — METRONIDAZOLE 250 MG PO TABS: 250.0000 mg | ORAL_TABLET | Freq: Three times a day (TID) | ORAL | Status: DC

## 2015-05-03 NOTE — Telephone Encounter (Signed)
Patient calling to report LLQ pain since last Friday. Also, states she is constipated. Hx diverticulitis. States she has taken Miralax with some results but still feels constipated. She is doing bowel rest-liquids. Temp today is 99. She has been out of town and is "off schedule." Denies nausea. She is asking for antibiotics.

## 2015-05-03 NOTE — Progress Notes (Signed)
Chief Complaint:  Chief Complaint  Patient presents with  . Abdominal Pain    started yesterday morning     HPI: Abigail [name] is a 54 y.o. female who is here for acute onset of Left lower quadrant abd pain since yesterday morning, she has a hx of perforated diverticulitis  s/p sigmoidectomy in 2013, pancreatitis, chronic constipation, multiple abd surgeries including appendectomy. She has had 1-6/10 pain ,  Certain positions make it worse.  Denies nause but has not eaten. Has had constipation, taking miralax x 1. Has not had a flare up since sigmoidectomy. She has a low grade temp. She has no back pain, no diarrhea, no melena or hematuria. No nausea or vomiting. Has tried miralax once. No urinary sxs.  Last BM was Monday, she has been able to pass gas but not today. Sh ehas ahd constipation for a long time and this doe snot feel like her normal constipation. Has had poor oral intake today, only drank 1 glass of water. No vaginal or urianry sxs.   Normal heart cath Has lupus, sjogren Has had multiple abd surgeries, including appendectomy   Past Medical History  Diagnosis Date  . Lupus   . Migraine   . Complication of anesthesia     Sjogrens Syndrome  . Sjogren's syndrome   . Vasculitis     "autoimmune"  . Diverticulitis   . Gastritis   . Duodenitis   . Pancreatitis   . Fibromyalgia   . Perforation bowel     "microperforation"  . Asthma   . Melanoma 2008    Back; [place].  . Depression   . GERD (gastroesophageal reflux disease)   . Hypertension   . Urticaria   . Obstructive sleep apnea 12/25/2012  . SLE (systemic lupus erythematosus)    Past Surgical History  Procedure Laterality Date  . Melanoma excision    . Tmj arthroplasty  1970  . Appendectomy    . Cholecystectomy    . Pancreas surgery      sphincterotomy  . Abdominoplasty    . Liver biopsy    . Anterior cervical decomp/discectomy fusion  10/29/2011    Procedure: ANTERIOR CERVICAL  DECOMPRESSION/DISCECTOMY FUSION 2 LEVELS;  Surgeon: Floyce Stakes;  Location: [place] [facility];  Service: Neurosurgery;  Laterality: N/A;  Cervical five-six,Cervical six-seven nterior cervical decompression/diskectomy, fusion, plate  . Sigmoidectomy  03/2012    done at [facility]  . Abdominal hysterectomy      Partial; Ovaries intact.  DUB/fibroids.  . Colonoscopy  11/11/2008  . Sleep study  10/11/2012    +severe OSA.  [facility].  . Colonoscopy N/A 09/20/2013    Procedure: COLONOSCOPY;  Surgeon: Lafayette Dragon, MD;  Location: [facility];  Service: Endoscopy;  Laterality: N/A;  . Reduction mammaplasty Bilateral   . Back surgery    . Left heart catheterization with coronary angiogram N/A 06/15/2014    Procedure: LEFT HEART CATHETERIZATION WITH CORONARY ANGIOGRAM;  Surgeon: Peter M Martinique, MD;  Location: [facility] CATH LAB;  Service: Cardiovascular;  Laterality: N/A;  . Ultrasound guidance for vascular access  06/15/2014    Procedure: ULTRASOUND GUIDANCE FOR VASCULAR ACCESS;  Surgeon: Peter M Martinique, MD;  Location: [facility] CATH LAB;  Service: Cardiovascular;;  . Cervical fusion     History   Social History  . Marital Status: Married    Spouse Name: [name]  . Number of Children: 2  . Years of Education: Bachelors   Occupational History  .  [employer]   Social History Main Topics  . Smoking status: Never Smoker   . Smokeless tobacco: Never Used  . Alcohol Use: Yes     Comment: 01/20/12 once/month  . Drug Use: No  . Sexual Activity:    Partners: Male   Other Topics Concern  . None   Social History Narrative   Marital status: married x 28 years to [place]; happily married; no abuse      Children: 2 children (54 yo son, 5 yo daughter); no grandchildren      Lives with: husband, daughter      Employment:  [employer] 2009-2013.  Resigned 08/2012.  Bachelor degrees in communication.     Patient works as a Surveyor, minerals.   Patient drinks 2 caffeine beverages daily.   Patient is left -handed.         Tobacco:  None        Alcohol:  Once weekly; wine      Drugs: none      Exercise:  none               Family History  Problem Relation Age of Onset  . Colon cancer Paternal Uncle   . Colon polyps Sister   . Depression Sister     suicide  . Colon polyps Brother   . Colon polyps Mother   . Dementia Mother   . Heart disease Father 53    CHF  . Macular degeneration Father    Allergies  Allergen Reactions  . Shrimp [Shellfish Allergy] Shortness Of Breath  . Meperidine Hcl Other (See Comments)    tachycardia  . Promethazine Hcl Other (See Comments)    "uncontrolled vomiting"  . Doxycycline Hives  . Latex Other (See Comments)    Breathing problems.  . Sulfonamide Derivatives Hives  . Tetracyclines & Related Rash   Prior to Admission medications   Medication Sig Start Date End Date Taking? Authorizing Provider  albuterol (PROVENTIL HFA;VENTOLIN HFA) 108 (90 BASE) MCG/ACT inhaler Inhale 2 puffs into the lungs every 6 (six) hours as needed for shortness of breath. For shortness of breath. 04/29/14  Yes Orma Flaming, MD  amphetamine-dextroamphetamine (ADDERALL XR) 20 MG 24 hr capsule Take 1 capsule (20 mg total) by mouth daily. 04/03/15  Yes Wardell Honour, MD  ciprofloxacin (CIPRO) 500 MG tablet Take 1 tablet (500 mg total) by mouth 2 (two) times daily. 05/03/15  Yes Lafayette Dragon, MD  estradiol (ESTRACE) 1 MG tablet Take 1 tablet (1 mg total) by mouth daily. 11/01/14  Yes Barton Fanny, MD  fluconazole (DIFLUCAN) 150 MG tablet TAKE 1 TABLET BY MOUTH ONCE. 04/03/15  Yes Wardell Honour, MD  fluticasone [facility]) 50 MCG/ACT nasal spray Place 2 sprays into both nostrils daily. 02/16/15  Yes Barton Fanny, MD  LORazepam (ATIVAN) 1 MG tablet TAKE 1 TABLET TWICE DAILY AS NEEDED FOR ANXIETY. 04/25/15  Yes Bennett Scrape V, PA-C  losartan (COZAAR) 50 MG tablet Take 1 tablet (50 mg total) by mouth daily. 11/01/14  Yes Barton Fanny, MD  methylphenidate (RITALIN) 10 MG tablet Take 1 tablet (10 mg  total) by mouth 2 (two) times daily. 04/03/15  Yes Wardell Honour, MD  metroNIDAZOLE (FLAGYL) 250 MG tablet Take 1 tablet (250 mg total) by mouth 3 (three) times daily. 05/03/15  Yes Lafayette Dragon, MD  montelukast (SINGULAIR) 10 MG tablet Take 1 tablet (10 mg total) by mouth at bedtime. 02/16/15  Yes Barton Fanny,  MD  potassium chloride (KLOR-CON) 20 MEQ packet Take 20 mEq by mouth daily as needed. Take when potassium is low 06/07/14  Yes Barton Fanny, MD  predniSONE (DELTASONE) 10 MG tablet Take 10 mg by mouth daily with breakfast.   Yes Historical Provider, MD  sertraline (ZOLOFT) 100 MG tablet Take 1 tablet by mouth daily. 02/16/15  Yes Barton Fanny, MD  verapamil (VERELAN PM) 120 MG 24 hr capsule Take 1 capsule (120 mg total) by mouth at bedtime. 11/01/14  Yes Barton Fanny, MD  zolpidem (AMBIEN) 10 MG tablet Take 5 mg by mouth at bedtime as needed. For insomnia 04/29/14  Yes Orma Flaming, MD  ranitidine (ZANTAC) 75 MG tablet Take 75 mg by mouth 2 (two) times daily. Pt takes OTC 75-150 mg as needed (2x/week)    Historical Provider, MD     ROS: The patient denies  night sweats, unintentional weight loss, chest pain, palpitations, wheezing, dyspnea on exertion,dysuria, hematuria, melena, numbness, weakness, or tingling.  All other systems have been reviewed and were otherwise negative with the exception of those mentioned in the HPI and as above.    PHYSICAL EXAM: Filed Vitals:   05/03/15 1554  BP: 136/78  Pulse: 105  Temp: 99.9 F (37.7 C)  Resp: 17   Filed Vitals:   05/03/15 1554  Height: 5\' 5"  (1.651 m)  Weight: 217 lb 12.8 oz (98.793 kg)   Body mass index is 36.24 kg/(m^2).   General: Alert, minimal acute distress HEENT:  Normocephalic, atraumatic, oropharynx patent. EOMI, PERRLA Cardiovascular:  Regular rate and rhythm, no rubs murmurs or gallops. Radial pulse intact. No pedal edema.  Respiratory: Clear to auscultation bilaterally.  No wheezes, rales, or  rhonchi.  No cyanosis, no use of accessory musculature GI: No organomegaly, abdomen is soft and  + tender LLQ, decrease bowel sounds.  No masses.NOndistended, no rebound, no guarding Skin: No rashes. Neurologic: Facial musculature symmetric. Psychiatric: Patient is appropriate throughout our interaction. Lymphatic: No cervical lymphadenopathy Musculoskeletal: Gait intact.   LABS: Results for orders placed or performed in visit on 05/03/15  POCT CBC  Result Value Ref Range   WBC 5.9 4.6 - 10.2 K/uL   Lymph, poc 1.3 0.6 - 3.4   POC LYMPH PERCENT 22.2 10 - 50 %L   MID (cbc) 0.5 0 - 0.9   POC MID % 8.7 0 - 12 %M   POC Granulocyte 4.1 2 - 6.9   Granulocyte percent 69.1 37 - 80 %G   RBC 4.98 4.04 - 5.48 M/uL   Hemoglobin 13.2 12.2 - 16.2 g/dL   HCT, POC 41.5 37.7 - 47.9 %   MCV 83.4 80 - 97 fL   MCH, POC 26.5 (A) 27 - 31.2 pg   MCHC 31.8 31.8 - 35.4 g/dL   RDW, POC 13.9 %   Platelet Count, POC 228 142 - 424 K/uL   MPV 8.4 0 - 99.8 fL  POCT UA - Microscopic Only  Result Value Ref Range   WBC, Ur, HPF, POC neg    RBC, urine, microscopic neg    Bacteria, U Microscopic neg    Mucus, UA neg    Epithelial cells, urine per micros 0-1    Crystals, Ur, HPF, POC neg    Casts, Ur, LPF, POC neg    Yeast, UA neg   POCT urinalysis dipstick  Result Value Ref Range   Color, UA yellow    Clarity, UA clear    Glucose, UA neg  Bilirubin, UA neg    Ketones, UA neg    Spec Grav, UA 1.010    Blood, UA neg    pH, UA 7.0    Protein, UA neg    Urobilinogen, UA 0.2    Nitrite, UA neg    Leukocytes, UA Negative Negative     EKG/XRAY:   Primary read interpreted by Dr. Marin Comment at [facility]. No free air, + mod stool, no obstruction   ASSESSMENT/PLAN: Encounter Diagnoses  Name Primary?  . Generalized abdominal pain Yes  . Bloating symptom   . Dehydration    ? Early diverticultitis vs constipation vs combination of both.  Cbc and acute abd and UA reassuring She feels better after 2 L IVF and  also a 30 mg IM injection of Toradol. She has already gotten a prescription for Flagyl and Cipro for diverticulitis from her gastroenterologist and so will take that I've given her a very small prescription of Norco and precautions with Norco. Would prefer her to take just plain Tylenol. I would like her to try to push fluids at home and also take MiraLAX for constipation. Should stay on a bland liquid diet for now. Labs pending Follow-up in the a.m. by phone with labs  Gross sideeffects, risk and benefits, and alternatives of medications d/w patient. Patient is aware that all medications have potential sideeffects and we are unable to predict every sideeffect or drug-drug interaction that may occur.  Lachina Salsberry, [place], DO 05/03/2015 6:46 PM   05/03/16---felling better, did not even fill norco which is good, pushing fluids, no BM yet. Cont with bland liquid diet.

## 2015-05-03 NOTE — Patient Instructions (Signed)
Diverticulosis Diverticulosis is the condition that develops when small pouches (diverticula) form in the wall of your colon. Your colon, or large intestine, is where water is absorbed and stool is formed. The pouches form when the inside layer of your colon pushes through weak spots in the outer layers of your colon. CAUSES  No one knows exactly what causes diverticulosis. RISK FACTORS  Being older than 80. Your risk for this condition increases with age. Diverticulosis is rare in people younger than 40 years. By age 30, almost everyone has it.  Eating a low-fiber diet.  Being frequently constipated.  Being overweight.  Not getting enough exercise.  Smoking.  Taking over-the-counter pain medicines, like aspirin and ibuprofen. SYMPTOMS  Most people with diverticulosis do not have symptoms. DIAGNOSIS  Because diverticulosis often has no symptoms, health care providers often discover the condition during an exam for other colon problems. In many cases, a health care provider will diagnose diverticulosis while using a flexible scope to examine the colon (colonoscopy). TREATMENT  If you have never developed an infection related to diverticulosis, you may not need treatment. If you have had an infection before, treatment may include:  Eating more fruits, vegetables, and grains.  Taking a fiber supplement.  Taking a live bacteria supplement (probiotic).  Taking medicine to relax your colon. HOME CARE INSTRUCTIONS   Drink at least 6-8 glasses of water each day to prevent constipation.  Try not to strain when you have a bowel movement.  Keep all follow-up appointments. If you have had an infection before:  Increase the fiber in your diet as directed by your health care provider or dietitian.  Take a dietary fiber supplement if your health care provider approves.  Only take medicines as directed by your health care provider. SEEK MEDICAL CARE IF:   You have abdominal  pain.  You have bloating.  You have cramps.  You have not gone to the bathroom in 3 days. SEEK IMMEDIATE MEDICAL CARE IF:   Your pain gets worse.  Yourbloating becomes very bad.  You have a fever or chills, and your symptoms suddenly get worse.  You begin vomiting.  You have bowel movements that are bloody or black. MAKE SURE YOU:  Understand these instructions.  Will watch your condition.  Will get help right away if you are not doing well or get worse. Document Released: 07/25/2004 Document Revised: 11/02/2013 Document Reviewed: 09/22/2013 [facility] Patient Information 2015 [place], [place]. This information is not intended to replace advice given to you by your health care provider. Make sure you discuss any questions you have with your health care provider. Diverticulitis Diverticulitis is inflammation or infection of small pouches in your colon that form when you have a condition called diverticulosis. The pouches in your colon are called diverticula. Your colon, or large intestine, is where water is absorbed and stool is formed. Complications of diverticulitis can include:  Bleeding.  Severe infection.  Severe pain.  Perforation of your colon.  Obstruction of your colon. CAUSES  Diverticulitis is caused by bacteria. Diverticulitis happens when stool becomes trapped in diverticula. This allows bacteria to grow in the diverticula, which can lead to inflammation and infection. RISK FACTORS People with diverticulosis are at risk for diverticulitis. Eating a diet that does not include enough fiber from fruits and vegetables may make diverticulitis more likely to develop. SYMPTOMS  Symptoms of diverticulitis may include:  Abdominal pain and tenderness. The pain is normally located on the left side of the abdomen, but  may occur in other areas.  Fever and chills.  Bloating.  Cramping.  Nausea.  Vomiting.  Constipation.  Diarrhea.  Blood in your  stool. DIAGNOSIS  Your health care provider will ask you about your medical history and do a physical exam. You may need to have tests done because many medical conditions can cause the same symptoms as diverticulitis. Tests may include:  Blood tests.  Urine tests.  Imaging tests of the abdomen, including X-rays and CT scans. When your condition is under control, your health care provider may recommend that you have a colonoscopy. A colonoscopy can show how severe your diverticula are and whether something else is causing your symptoms. TREATMENT  Most cases of diverticulitis are mild and can be treated at home. Treatment may include:  Taking over-the-counter pain medicines.  Following a clear liquid diet.  Taking antibiotic medicines by mouth for 7-10 days. More severe cases may be treated at a hospital. Treatment may include:  Not eating or drinking.  Taking prescription pain medicine.  Receiving antibiotic medicines through an IV tube.  Receiving fluids and nutrition through an IV tube.  Surgery. HOME CARE INSTRUCTIONS   Follow your health care provider's instructions carefully.  Follow a full liquid diet or other diet as directed by your health care provider. After your symptoms improve, your health care provider may tell you to change your diet. He or she may recommend you eat a high-fiber diet. Fruits and vegetables are good sources of fiber. Fiber makes it easier to pass stool.  Take fiber supplements or probiotics as directed by your health care provider.  Only take medicines as directed by your health care provider.  Keep all your follow-up appointments. SEEK MEDICAL CARE IF:   Your pain does not improve.  You have a hard time eating food.  Your bowel movements do not return to normal. SEEK IMMEDIATE MEDICAL CARE IF:   Your pain becomes worse.  Your symptoms do not get better.  Your symptoms suddenly get worse.  You have a fever.  You have repeated  vomiting.  You have bloody or black, tarry stools. MAKE SURE YOU:   Understand these instructions.  Will watch your condition.  Will get help right away if you are not doing well or get worse. Document Released: 08/07/2005 Document Revised: 11/02/2013 Document Reviewed: 09/22/2013 [facility] Patient Information 2015 [place], [place]. This information is not intended to replace advice given to you by your health care provider. Make sure you discuss any questions you have with your health care provider.

## 2015-05-03 NOTE — Telephone Encounter (Signed)
Patient notified of recommendations. Rx's sent to pharmacy. 

## 2015-05-03 NOTE — Telephone Encounter (Signed)
Please send Cipro 500mg , #20 ,1 po bid, and Flagyl 250 mg, 1 po tid, #30. No refills. Her daughter has an appointment with me this Friday so I can  talked to her about it  when she comes with her.

## 2015-05-04 LAB — COMPLETE METABOLIC PANEL WITHOUT GFR
ALT: 33 U/L (ref 0–35)
AST: 30 U/L (ref 0–37)
Alkaline Phosphatase: 77 U/L (ref 39–117)
GFR, Est Non African American: 84 mL/min
Sodium: 140 meq/L (ref 135–145)
Total Bilirubin: 0.4 mg/dL (ref 0.2–1.2)
Total Protein: 7.2 g/dL (ref 6.0–8.3)

## 2015-05-04 LAB — COMPLETE METABOLIC PANEL WITH GFR
Albumin: 4 g/dL (ref 3.5–5.2)
BUN: 14 mg/dL (ref 6–23)
CO2: 26 mEq/L (ref 19–32)
Calcium: 9.3 mg/dL (ref 8.4–10.5)
Chloride: 103 mEq/L (ref 96–112)
Creat: 0.8 mg/dL (ref 0.50–1.10)
GFR, Est African American: >89 mL/min
Glucose, Bld: 95 mg/dL (ref 70–99)
Potassium: 3.8 mEq/L (ref 3.5–5.3)

## 2015-05-04 LAB — LIPASE: Lipase: 65 U/L (ref 0–75)

## 2015-05-04 LAB — AMYLASE: Amylase: 39 U/L (ref 0–105)

## 2015-05-16 ENCOUNTER — Other Ambulatory Visit: Payer: Self-pay | Admitting: Family Medicine

## 2015-06-05 ENCOUNTER — Encounter: Payer: Self-pay | Admitting: *Deleted

## 2015-06-08 ENCOUNTER — Ambulatory Visit: Payer: Medicare Other | Admitting: Neurology

## 2015-07-10 ENCOUNTER — Other Ambulatory Visit: Payer: Self-pay | Admitting: Internal Medicine

## 2015-07-14 ENCOUNTER — Other Ambulatory Visit: Payer: Self-pay | Admitting: Family Medicine

## 2015-07-14 NOTE — Telephone Encounter (Signed)
Last seen 05/03/2015, Last filled 06/07/2014

## 2015-07-15 ENCOUNTER — Emergency Department (HOSPITAL_COMMUNITY)
Admission: EM | Admit: 2015-07-15 | Discharge: 2015-07-15 | Disposition: A | Discharge status: Home / Self Care | Payer: Medicare Other | Attending: Emergency Medicine | Admitting: Emergency Medicine

## 2015-07-15 ENCOUNTER — Encounter (HOSPITAL_COMMUNITY): Payer: Self-pay | Admitting: Emergency Medicine

## 2015-07-15 DIAGNOSIS — Z79899 Other long term (current) drug therapy: Secondary | ICD-10-CM | POA: Diagnosis not present

## 2015-07-15 DIAGNOSIS — Z85820 Personal history of malignant melanoma of skin: Secondary | ICD-10-CM | POA: Diagnosis not present

## 2015-07-15 DIAGNOSIS — K219 Gastro-esophageal reflux disease without esophagitis: Secondary | ICD-10-CM | POA: Insufficient documentation

## 2015-07-15 DIAGNOSIS — I10 Essential (primary) hypertension: Secondary | ICD-10-CM | POA: Diagnosis not present

## 2015-07-15 DIAGNOSIS — G43909 Migraine, unspecified, not intractable, without status migrainosus: Secondary | ICD-10-CM | POA: Diagnosis not present

## 2015-07-15 DIAGNOSIS — F329 Major depressive disorder, single episode, unspecified: Secondary | ICD-10-CM | POA: Insufficient documentation

## 2015-07-15 DIAGNOSIS — Z8719 Personal history of other diseases of the digestive system: Secondary | ICD-10-CM | POA: Insufficient documentation

## 2015-07-15 DIAGNOSIS — J45901 Unspecified asthma with (acute) exacerbation: Secondary | ICD-10-CM | POA: Diagnosis not present

## 2015-07-15 DIAGNOSIS — R0602 Shortness of breath: Secondary | ICD-10-CM | POA: Diagnosis present

## 2015-07-15 DIAGNOSIS — Z91040 Latex allergy status: Secondary | ICD-10-CM | POA: Diagnosis not present

## 2015-07-15 DIAGNOSIS — M797 Fibromyalgia: Secondary | ICD-10-CM | POA: Diagnosis not present

## 2015-07-15 MED ORDER — PREDNISONE 20 MG PO TABS: 40.0000 mg | ORAL_TABLET | Freq: Every day | ORAL | Status: AC

## 2015-07-15 MED ORDER — PREDNISONE 20 MG PO TABS
40.0000 mg | ORAL_TABLET | Freq: Once | ORAL | Status: AC
  Administered 2015-07-15: 40 mg via ORAL
  Filled 2015-07-15: qty 2

## 2015-07-15 MED ORDER — IPRATROPIUM-ALBUTEROL 0.5-2.5 (3) MG/3ML IN SOLN
3.0000 mL | RESPIRATORY_TRACT | Status: AC
  Administered 2015-07-15 (×3): 3 mL via RESPIRATORY_TRACT
  Filled 2015-07-15: qty 9

## 2015-07-15 MED ORDER — ALBUTEROL SULFATE (2.5 MG/3ML) 0.083% IN NEBU
5.0000 mg | INHALATION_SOLUTION | Freq: Once | RESPIRATORY_TRACT | Status: AC
  Administered 2015-07-15: 5 mg via RESPIRATORY_TRACT
  Filled 2015-07-15: qty 6

## 2015-07-15 MED ORDER — ALBUTEROL SULFATE (2.5 MG/3ML) 0.083% IN NEBU: 2.5000 mg | INHALATION_SOLUTION | RESPIRATORY_TRACT | Status: DC | PRN

## 2015-07-15 NOTE — ED Notes (Signed)
Pt tolerating breathing treatment well.

## 2015-07-15 NOTE — ED Notes (Signed)
Bed: TD17 Expected date:  Expected time:  Means of arrival:  Comments: t1 after breathing treatment

## 2015-07-15 NOTE — ED Notes (Addendum)
Pt from home c/o asthma. Pt coughing during triage however is able to pause to speak. She took ventalin, zyrtec, and  Benadryl. Lungs diminished bilaterally

## 2015-07-15 NOTE — ED Notes (Addendum)
Pt reports she has had albuterol in the past with out diffculty.

## 2015-07-15 NOTE — ED Provider Notes (Signed)
CSN: [id number]     Arrival date & time 07/15/15  2041 History   First MD Initiated Contact with Patient 07/15/15 2112     Chief Complaint  Patient presents with  . Asthma     (Consider location/radiation/quality/duration/timing/severity/associated sxs/prior Treatment) Patient is a 54 y.o. female presenting with cough.  Cough Cough characteristics:  Non-productive and dry Severity:  Severe Onset quality:  Gradual Timing:  Constant Progression:  Partially resolved Chronicity:  New Smoker: no   Context: not animal exposure and not fumes   Ineffective treatments:  Beta-agonist inhaler Associated symptoms: shortness of breath   Associated symptoms: no chills, no fever and no rash     Past Medical History  Diagnosis Date  . Lupus   . Migraine   . Complication of anesthesia     Sjogrens Syndrome  . Sjogren's syndrome   . Vasculitis     "autoimmune"  . Diverticulitis   . Gastritis   . Duodenitis   . Pancreatitis   . Fibromyalgia   . Perforation bowel     "microperforation"  . Asthma   . Melanoma 2008    Back; [place].  . Depression   . GERD (gastroesophageal reflux disease)   . Hypertension   . Urticaria   . Obstructive sleep apnea 12/25/2012  . SLE (systemic lupus erythematosus)    Past Surgical History  Procedure Laterality Date  . Melanoma excision    . Tmj arthroplasty  1970  . Appendectomy    . Cholecystectomy    . Pancreas surgery      sphincterotomy  . Abdominoplasty    . Liver biopsy    . Anterior cervical decomp/discectomy fusion  10/29/2011    Procedure: ANTERIOR CERVICAL DECOMPRESSION/DISCECTOMY FUSION 2 LEVELS;  Surgeon: Floyce Stakes;  Location: [facility];  Service: Neurosurgery;  Laterality: N/A;  Cervical five-six,Cervical six-seven nterior cervical decompression/diskectomy, fusion, plate  . Sigmoidectomy  03/2012    done at [facility]  . Abdominal hysterectomy      Partial; Ovaries intact.  DUB/fibroids.  . Colonoscopy  11/11/2008  . Sleep study   10/11/2012    +severe OSA.  [facility].  . Colonoscopy N/A 09/20/2013    Procedure: COLONOSCOPY;  Surgeon: Lafayette Dragon, MD;  Location: [facility];  Service: Endoscopy;  Laterality: N/A;  . Reduction mammaplasty Bilateral   . Back surgery    . Left heart catheterization with coronary angiogram N/A 06/15/2014    Procedure: LEFT HEART CATHETERIZATION WITH CORONARY ANGIOGRAM;  Surgeon: Peter M Martinique, MD;  Location: [facility] C[facility] LAB;  Service: Cardiovascular;  Laterality: N/A;  . Ultrasound guidance for vascular access  06/15/2014    Procedure: ULTRASOUND GUIDANCE FOR VASCULAR ACCESS;  Surgeon: Peter M Martinique, MD;  Location: [facility] CATH LAB;  Service: Cardiovascular;;  . Cervical fusion     Family History  Problem Relation Age of Onset  . Colon cancer Paternal Uncle   . Colon polyps Sister   . Depression Sister     suicide  . Colon polyps Brother   . Colon polyps Mother   . Dementia Mother   . Heart disease Father 31    CHF  . Macular degeneration Father    Social History  Substance Use Topics  . Smoking status: Never Smoker   . Smokeless tobacco: Never Used  . Alcohol Use: Yes     Comment: 01/20/12 once/month   OB History    No data available     Review of Systems  Constitutional: Negative  for fever and chills.  Eyes: Negative for pain and redness.  Respiratory: Positive for cough and shortness of breath.   Gastrointestinal: Negative for nausea and vomiting.  Endocrine: Negative for polydipsia and polyuria.  Musculoskeletal: Negative for back pain.  Skin: Negative for rash.  All other systems reviewed and are negative.     Allergies  Shrimp; Meperidine hcl; Promethazine hcl; Doxycycline; Latex; Sulfonamide derivatives; and Tetracyclines & related  Home Medications   Prior to Admission medications   Medication Sig Start Date End Date Taking? Authorizing Provider  amphetamine-dextroamphetamine (ADDERALL XR) 20 MG 24 hr capsule Take 1 capsule (20 mg total) by mouth  daily. Patient taking differently: Take 20 mg by mouth daily as needed (for concentration).  04/03/15  Yes Wardell Honour, MD  amphetamine-dextroamphetamine (ADDERALL) 10 MG tablet Take 5-10 mg by mouth daily as needed. concentration 08/16/13  Yes Historical Provider, MD  estradiol (ESTRACE) 1 MG tablet Take 1 tablet (1 mg total) by mouth daily. Patient taking differently: Take 1 mg by mouth daily as needed (hormones).  11/01/14  Yes Barton Fanny, MD  fluticasone [facility]) 50 MCG/ACT nasal spray Place 2 sprays into both nostrils daily. Patient taking differently: Place 2 sprays into both nostrils daily as needed for allergies.  02/16/15  Yes Barton Fanny, MD  LORazepam (ATIVAN) 1 MG tablet TAKE 1 TABLET TWICE DAILY AS NEEDED FOR ANXIETY. 04/25/15  Yes Bennett Scrape V, PA-C  losartan (COZAAR) 100 MG tablet TAKE 1 TABLET IN THE MORNING. 05/17/15  Yes Wardell Honour, MD  methylphenidate (RITALIN) 10 MG tablet Take 1 tablet (10 mg total) by mouth 2 (two) times daily. Patient taking differently: Take 10 mg by mouth 2 (two) times daily as needed (concentration).  04/03/15  Yes Wardell Honour, MD  potassium chloride (KLOR-CON) 20 MEQ packet Take 20 mEq by mouth daily as needed. Take when potassium is low 06/07/14  Yes Barton Fanny, MD  ranitidine (ZANTAC) 75 MG tablet Take 75 mg by mouth daily.    Yes Historical Provider, MD  sertraline (ZOLOFT) 100 MG tablet Take 1 tablet by mouth daily. 02/16/15  Yes Barton Fanny, MD  verapamil (VERELAN PM) 120 MG 24 hr capsule Take 1 capsule (120 mg total) by mouth at bedtime. 11/01/14  Yes Barton Fanny, MD  zolpidem (AMBIEN) 10 MG tablet TAKE 1 TABLET AT BEDTIME AS NEEDED FOR SLEEP. 07/12/15  Yes Wardell Honour, MD  albuterol (PROVENTIL) (2.5 MG/3ML) 0.083% nebulizer solution Take 3 mLs (2.5 mg total) by nebulization every 4 (four) hours as needed for wheezing or shortness of breath. 07/15/15   Merrily Pew, MD  predniSONE (DELTASONE) 20 MG tablet  Take 2 tablets (40 mg total) by mouth daily with breakfast. 07/16/15 07/19/15  Merrily Pew, MD   BP 162/90 mmHg  Pulse 91  Temp(Src) 98.5 F (36.9 C) (Oral)  Resp 20  SpO2 93% Physical Exam  Constitutional: She is oriented to person, place, and time. She appears well-developed and well-nourished.  HENT:  Head: Normocephalic and atraumatic.  Eyes: Conjunctivae and EOM are normal. Right eye exhibits no discharge. Left eye exhibits no discharge.  Cardiovascular: Normal rate and regular rhythm.   Pulmonary/Chest: Effort normal and breath sounds normal. No respiratory distress.  Abdominal: Soft. She exhibits no distension. There is no tenderness. There is no rebound.  Musculoskeletal: Normal range of motion. She exhibits no edema or tenderness.  Neurological: She is alert and oriented to person, place, and time.  Skin:  Skin is warm and dry.  Nursing note and vitals reviewed.   ED Course  Procedures (including critical care time) Labs Review Labs Reviewed - No data to display  Imaging Review No results found. I have personally reviewed and evaluated these images and lab results as part of my medical decision-making.   EKG Interpretation None      MDM   Final diagnoses:  Asthma, unspecified asthma severity, with acute exacerbation   54 year old female with a history of asthma presents with acute exacerbation. This happened after she was working the yard and exposed to dust and mold for which she's had a problem before. Took a wedge of albuterol prior to arrival and has improving symptoms here. Still diminished breath sounds and slight wheezing. Does not any rotatory distress. Steroids and DuoNeb's administered in approximate hour later patient was reevaluated at improved symptoms improved breath sounds. Still no hypoxia or tachypnea. Heart slightly straightforward asthma exacerbation doubt pulmonary embolus, pneumonia or other acute causes for her symptoms. She'll return here for any  new or worsening symptoms otherwise will do every 4 hours albuterol at home with 4 days of steroids.   I have personally and contemperaneously reviewed labs and imaging and used in my decision making as above.   A medical screening exam was performed and I feel the patient has had an appropriate workup for their chief complaint at this time and likelihood of emergent condition existing is low. They have been counseled on decision, discharge, follow up and which symptoms necessitate immediate return to the emergency department. They or their family verbally stated understanding and agreement with plan and discharged in stable condition.      Merrily Pew, MD 07/15/15 [phone]

## 2015-07-15 NOTE — ED Notes (Signed)
Pt continues to deny pain and currently has no breathing difficulty.  O2 saturation 95% on room air.

## 2015-07-16 ENCOUNTER — Other Ambulatory Visit: Payer: Self-pay | Admitting: Internal Medicine

## 2015-07-18 ENCOUNTER — Other Ambulatory Visit: Payer: Self-pay | Admitting: Family Medicine

## 2015-07-19 MED ORDER — AMPHETAMINE-DEXTROAMPHET ER 20 MG PO CP24: 20.0000 mg | ORAL_CAPSULE | Freq: Every day | ORAL | Status: DC | PRN

## 2015-07-19 NOTE — Telephone Encounter (Signed)
K+ was already sent in, but can you please address req for Adderall RF, Dr Tamala Julian? It looks like you saw pt for this back in May. This was originally routed to Judson Roch, but I thought it more appropriate to send to you?

## 2015-07-19 NOTE — Telephone Encounter (Signed)
Call --- Adderall refill is ready for pick up.

## 2015-07-20 NOTE — Telephone Encounter (Signed)
Notified pt Rx ready.

## 2015-08-14 ENCOUNTER — Other Ambulatory Visit: Payer: Self-pay

## 2015-08-14 DIAGNOSIS — F419 Anxiety disorder, unspecified: Secondary | ICD-10-CM

## 2015-08-14 MED ORDER — LORAZEPAM 1 MG PO TABS: ORAL_TABLET | ORAL | Status: DC

## 2015-08-14 NOTE — Telephone Encounter (Signed)
Dr. Joseph Art, this patient requested that you refill her Lorazepam 2 mg. 1 pill a day twice a day. Patient uses [facility]. She states that they close at 8pm and she needs this medication this evening.   [phone]

## 2015-08-15 NOTE — Telephone Encounter (Signed)
Faxed. Pt notified 

## 2015-08-31 ENCOUNTER — Other Ambulatory Visit: Payer: Self-pay | Admitting: Family Medicine

## 2015-09-04 NOTE — Telephone Encounter (Signed)
Dr Tamala Julian, you seem to Rx for pt routinely, so sending to you for review. Do you want to RF?

## 2015-09-12 ENCOUNTER — Ambulatory Visit (INDEPENDENT_AMBULATORY_CARE_PROVIDER_SITE_OTHER): Payer: Medicare Other

## 2015-09-12 DIAGNOSIS — Z23 Encounter for immunization: Secondary | ICD-10-CM | POA: Diagnosis not present

## 2015-09-26 ENCOUNTER — Encounter: Payer: Self-pay | Admitting: Internal Medicine

## 2015-10-09 ENCOUNTER — Other Ambulatory Visit: Payer: Self-pay | Admitting: Internal Medicine

## 2015-10-12 ENCOUNTER — Ambulatory Visit (INDEPENDENT_AMBULATORY_CARE_PROVIDER_SITE_OTHER): Payer: Medicare Other | Admitting: Family Medicine

## 2015-10-12 VITALS — BP 120/80 | HR 86 | Temp 98.6°F | Resp 18 | Ht 65.0 in | Wt 220.0 lb

## 2015-10-12 DIAGNOSIS — J22 Unspecified acute lower respiratory infection: Secondary | ICD-10-CM | POA: Diagnosis not present

## 2015-10-12 DIAGNOSIS — R05 Cough: Secondary | ICD-10-CM | POA: Diagnosis not present

## 2015-10-12 DIAGNOSIS — M3500 Sicca syndrome, unspecified: Secondary | ICD-10-CM | POA: Diagnosis not present

## 2015-10-12 DIAGNOSIS — M329 Systemic lupus erythematosus, unspecified: Secondary | ICD-10-CM

## 2015-10-12 DIAGNOSIS — J0140 Acute pansinusitis, unspecified: Secondary | ICD-10-CM

## 2015-10-12 DIAGNOSIS — R059 Cough, unspecified: Secondary | ICD-10-CM

## 2015-10-12 MED ORDER — FLUTICASONE PROPIONATE 50 MCG/ACT NA SUSP: 2.0000 | Freq: Every day | NASAL | Status: DC

## 2015-10-12 MED ORDER — BENZONATATE 100 MG PO CAPS
200.0000 mg | ORAL_CAPSULE | Freq: Two times a day (BID) | ORAL | Status: DC | PRN
Start: 2015-10-12 — End: 2015-10-30

## 2015-10-12 MED ORDER — HYDROCOD POLST-CPM POLST ER 10-8 MG/5ML PO SUER: 5.0000 mL | Freq: Two times a day (BID) | ORAL | Status: DC | PRN

## 2015-10-12 MED ORDER — AZITHROMYCIN 250 MG PO TABS: ORAL_TABLET | ORAL | Status: DC

## 2015-10-12 MED ORDER — HYDROCODONE-HOMATROPINE 5-1.5 MG/5ML PO SYRP: 5.0000 mL | ORAL_SOLUTION | Freq: Every evening | ORAL | Status: DC | PRN

## 2015-10-12 NOTE — Patient Instructions (Signed)

## 2015-10-16 NOTE — Progress Notes (Signed)
 Chief Complaint:  Chief Complaint  Patient presents with  . Cough    Onset 5 days  . Shortness of Breath    5 days/ getting worse  . Medication Refill    Flonase    HPI: Abigail [name] is a 54 y.o. female who reports to [facility] today complaining of 5 day history of URI sxs She has had sick contact over Thanksgiving She has facial pain and also cough and some wheezign and SOB, she has a neb machine at home She has tried otc meds without releif She needs flonase refills Has had cough and chest congestion.  Past Medical History  Diagnosis Date  . Lupus ([place])   . Migraine   . Complication of anesthesia     Sjogrens Syndrome  . Sjogren's syndrome ([place])   . 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 Diverticulitis   . Gastritis   . Duodenitis   . Pancreatitis   . Fibromyalgia   . Perforation bowel ([place])     "microperforation"  . Asthma   . Melanoma ([place]) 2008    Back; [place].  . Depression   . GERD (gastroesophageal reflux disease)   . Hypertension   . Urticaria   . Obstructive sleep apnea 12/25/2012  . SLE (systemic lupus erythematosus) ([place])    Past Surgical History  Procedure Laterality Date  . Melanoma excision    . Tmj arthroplasty  1970  . Appendectomy    . Cholecystectomy    . Pancreas surgery      sphincterotomy  . Abdominoplasty    . Liver biopsy    . Anterior cervical decomp/discectomy fusion  10/29/2011    Procedure: ANTERIOR CERVICAL DECOMPRESSION/DISCECTOMY FUSION 2 LEVELS;  Surgeon: Floyce Stakes;  Location: [facility];  Service: Neurosurgery;  Laterality: N/A;  Cervical five-six,Cervical six-seven nterior cervical decompression/diskectomy, fusion, plate  . Sigmoidectomy  03/2012    done at [facility]  . Abdominal hysterectomy      Partial; Ovaries intact.  DUB/fibroids.  . Colonoscopy  11/11/2008  . Sleep study  10/11/2012    +severe OSA.  [facility].  . Colonoscopy N/A 09/20/2013    Procedure: COLONOSCOPY;  Surgeon: Lafayette Dragon, MD;   Location: [facility] ENDOSCOPY;  Service: Endoscopy;  Laterality: N/A;  . Reduction mammaplasty Bilateral   . Back surgery    . Left heart catheterization with coronary angiogram N/A 06/15/2014    Procedure: LEFT HEART CATHETERIZATION WITH CORONARY ANGIOGRAM;  Surgeon: Peter M Martinique, MD;  Location: [facility] CATH LAB;  Service: Cardiovascular;  Laterality: N/A;  . Ultrasound guidance for vascular access  06/15/2014    Procedure: ULTRASOUND GUIDANCE FOR VASCULAR ACCESS;  Surgeon: Peter M Martinique, MD;  Location: [facility] CATH LAB;  Service: Cardiovascular;;  . Cervical fusion     Social History   Social History  . Marital Status: Married    Spouse Name: [name]  . Number of Children: 2  . Years of Education: Bachelors   Occupational History  .  [place]   Social History Main Topics  . Smoking status: Never Smoker   . Smokeless tobacco: Never Used  . Alcohol Use: Yes     Comment: 01/20/12 once/month  . Drug Use: No  . Sexual Activity:    Partners: Male   Other Topics Concern  . None   Social History Narrative   Marital status: married x 28 years to [place]; happily married; no abuse      Children:  2 children (5 yo son, 30 yo daughter); no grandchildren      Lives with: husband, daughter      Employment:  [employer] 2009-2013.  Resigned 08/2012.  Bachelor degrees in communication.     Patient works as a Surveyor, minerals.   Patient drinks 2 caffeine beverages daily.   Patient is left -handed.         Tobacco:  None      Alcohol:  Once weekly; wine      Drugs: none      Exercise:  none               Family History  Problem Relation Age of Onset  . Colon cancer Paternal Uncle   . Colon polyps Sister   . Depression Sister     suicide  . Colon polyps Brother   . Colon polyps Mother   . Dementia Mother   . Heart disease Father 64    CHF  . Macular degeneration Father    Allergies  Allergen Reactions  . Shrimp [Shellfish Allergy] Shortness Of Breath  . Meperidine Hcl Other (See Comments)    tachycardia  .  Promethazine Hcl Other (See Comments)    "uncontrolled vomiting"  . Doxycycline Hives  . Latex Other (See Comments)    Breathing problems.  . Sulfonamide Derivatives Hives  . Tetracyclines & Related Rash   Prior to Admission medications   Medication Sig Start Date End Date Taking? Authorizing Provider  albuterol (PROAIR HFA) 108 (90 BASE) MCG/ACT inhaler Use 2 puffs every 6 hours as needed for shortness of breath 10/10/15  Yes Orma Flaming, MD  amphetamine-dextroamphetamine (ADDERALL XR) 20 MG 24 hr capsule Take 1 capsule (20 mg total) by mouth daily as needed (for concentration). 07/19/15  Yes Wardell Honour, MD  amphetamine-dextroamphetamine (ADDERALL) 10 MG tablet Take 5-10 mg by mouth daily as needed. concentration 08/16/13  Yes Historical Provider, MD  estradiol (ESTRACE) 1 MG tablet Take 1 tablet (1 mg total) by mouth daily. Patient taking differently: Take 1 mg by mouth daily as needed (hormones).  11/01/14  Yes Barton Fanny, MD  fluconazole (DIFLUCAN) 150 MG tablet TAKE 1 TABLET BY MOUTH ONCE. 09/04/15  Yes Wardell Honour, MD  fluticasone [facility]) 50 MCG/ACT nasal spray Place 2 sprays into both nostrils daily. 10/12/15  Yes  P , DO  LORazepam (ATIVAN) 1 MG tablet TAKE 1 TABLET TWICE DAILY AS NEEDED FOR ANXIETY. 08/14/15  Yes Robyn Haber, MD  losartan (COZAAR) 100 MG tablet TAKE 1 TABLET IN THE MORNING. 05/17/15  Yes Wardell Honour, MD  methylphenidate (RITALIN) 10 MG tablet Take 1 tablet (10 mg total) by mouth 2 (two) times daily. Patient taking differently: Take 10 mg by mouth 2 (two) times daily as needed (concentration).  04/03/15  Yes Wardell Honour, MD  potassium chloride (KLOR-CON) 20 MEQ packet Take 20 mEq by mouth daily as needed. Take when potassium is low 06/07/14  Yes Barton Fanny, MD  potassium chloride SA (K-DUR,KLOR-CON) 20 MEQ tablet TAKE 1/2 TABLET TWICE DAILY AS NEEDED. 07/18/15  Yes Orma Flaming, MD  ranitidine (ZANTAC) 75 MG tablet Take 75 mg by mouth  daily.    Yes Historical Provider, MD  sertraline (ZOLOFT) 100 MG tablet Take 1 tablet by mouth daily. 02/16/15  Yes Barton Fanny, MD  verapamil (VERELAN PM) 120 MG 24 hr capsule Take 1 capsule (120 mg total) by mouth at bedtime. 11/01/14  Yes Barton Fanny,  MD  zolpidem (AMBIEN) 10 MG tablet TAKE 1 TABLET AT BEDTIME AS NEEDED FOR SLEEP. 07/12/15  Yes Wardell Honour, MD  azithromycin (ZITHROMAX) 250 MG tablet Take 2 tabs po now then 1 tab po daily 10/12/15    P , DO  benzonatate (TESSALON) 100 MG capsule Take 2 capsules (200 mg total) by mouth 2 (two) times daily as needed. 10/12/15    P , DO  HYDROcodone-homatropine (HYCODAN) 5-1.5 MG/5ML syrup Take 5 mLs by mouth at bedtime as needed for cough. 10/12/15    P , DO     ROS: The patient denies night sweats, unintentional weight loss, chest pain, palpitations,  dyspnea on exertion, nausea, vomiting, abdominal pain, dysuria, hematuria, melena, numbness, weakness, or tingling.   All other systems have been reviewed and were otherwise negative with the exception of those mentioned in the HPI and as above.    PHYSICAL EXAM: Filed Vitals:   10/12/15 0930  BP: 120/80  Pulse: 86  Temp: 98.6 F (37 C)  Resp: 18   SpO2 Readings from Last 3 Encounters:  10/12/15 97%  07/15/15 92%  05/03/15 96%   Body mass index is 36.61 kg/(m^2).   General: Alert, no acute distress HEENT:  Normocephalic, atraumatic, oropharynx patent. EOMI, PERRLA Erythematous throat, no exudates, TM normal, + sinus tenderness, + erythematous/boggy nasal mucosa Cardiovascular:  Regular rate and rhythm, no rubs murmurs or gallops.  Respiratory: Clear to auscultation bilaterally.  No wheezes, rales, or rhonchi.  No cyanosis, no use of accessory musculature Abdominal: No organomegaly, abdomen is soft and non-tender, positive bowel sounds. No masses. Skin: No rashes. Neurologic: Facial musculature symmetric. Psychiatric: Patient acts appropriately  throughout our interaction. Lymphatic: No cervical or submandibular lymphadenopathy Musculoskeletal: Gait intact.    LABS: Results for orders placed or performed in visit on 06/05/15  HM MAMMOGRAPHY  Result Value Ref Range   HM Mammogram      no mammographic evidence of malignancy. tter was sent informing the patient of the results.     EKG/XRAY:   Primary read interpreted by Dr. Marin Comment at [facility].   ASSESSMENT/PLAN: Encounter Diagnoses  Name Primary?  . Acute pansinusitis, recurrence not specified Yes  . Acute lower respiratory infection   . Cough    Rx Azithromycin, tessalon perles, flonase Will cover for bronchitis with z pack, she is immune compromise with SLE and Sjogrens.  She was rx tussionex but due to insurance opted for hycodan instead Advise to take nebs prn for SOB, currently in office no wheezing and good BS.  If worse then fu for chest xray  Fu prn   Gross sideeffects, risk and benefits, and alternatives of medications d/w patient. Patient is aware that all medications have potential sideeffects and we are unable to predict every sideeffect or drug-drug interaction that may occur.    DO  10/16/2015 3:27 PM

## 2015-10-17 ENCOUNTER — Ambulatory Visit (INDEPENDENT_AMBULATORY_CARE_PROVIDER_SITE_OTHER): Payer: Medicare Other | Admitting: Physician Assistant

## 2015-10-17 ENCOUNTER — Encounter: Payer: Self-pay | Admitting: Physician Assistant

## 2015-10-17 ENCOUNTER — Ambulatory Visit (INDEPENDENT_AMBULATORY_CARE_PROVIDER_SITE_OTHER): Payer: Medicare Other

## 2015-10-17 VITALS — BP 130/82 | HR 83 | Temp 98.9°F | Resp 16 | Ht 65.0 in | Wt 218.8 lb

## 2015-10-17 DIAGNOSIS — J22 Unspecified acute lower respiratory infection: Secondary | ICD-10-CM | POA: Diagnosis not present

## 2015-10-17 DIAGNOSIS — M3500 Sicca syndrome, unspecified: Secondary | ICD-10-CM

## 2015-10-17 DIAGNOSIS — M329 Systemic lupus erythematosus, unspecified: Secondary | ICD-10-CM

## 2015-10-17 DIAGNOSIS — I10 Essential (primary) hypertension: Secondary | ICD-10-CM

## 2015-10-17 LAB — COMPREHENSIVE METABOLIC PANEL
ALK PHOS: 75 U/L (ref 33–130)
ALT: 34 U/L — AB (ref 6–29)
AST: 31 U/L (ref 10–35)
Albumin: 4 g/dL (ref 3.6–5.1)
BILIRUBIN TOTAL: 0.3 mg/dL (ref 0.2–1.2)
BUN: 13 mg/dL (ref 7–25)
CALCIUM: 8.9 mg/dL (ref 8.6–10.4)
CO2: 26 mmol/L (ref 20–31)
Chloride: 105 mmol/L (ref 98–110)
Creat: 0.7 mg/dL (ref 0.50–1.05)
Glucose, Bld: 103 mg/dL — ABNORMAL HIGH (ref 65–99)
POTASSIUM: 3.7 mmol/L (ref 3.5–5.3)
Sodium: 136 mmol/L (ref 135–146)
TOTAL PROTEIN: 6.8 g/dL (ref 6.1–8.1)

## 2015-10-17 LAB — CBC WITH DIFFERENTIAL/PLATELET
BASOS ABS: 0 10*3/uL (ref 0.0–0.1)
Basophils Relative: 0 % (ref 0–1)
Eosinophils Absolute: 0.1 10*3/uL (ref 0.0–0.7)
Eosinophils Relative: 2 % (ref 0–5)
HEMATOCRIT: 40.9 % (ref 36.0–46.0)
Hemoglobin: 13.6 g/dL (ref 12.0–15.0)
LYMPHS ABS: 1.5 10*3/uL (ref 0.7–4.0)
LYMPHS PCT: 30 % (ref 12–46)
MCH: 27.9 pg (ref 26.0–34.0)
MCHC: 33.3 g/dL (ref 30.0–36.0)
MCV: 83.8 fL (ref 78.0–100.0)
MPV: 10.4 fL (ref 8.6–12.4)
Monocytes Absolute: 0.4 10*3/uL (ref 0.1–1.0)
Monocytes Relative: 8 % (ref 3–12)
NEUTROS PCT: 60 % (ref 43–77)
Neutro Abs: 3 10*3/uL (ref 1.7–7.7)
PLATELETS: 243 10*3/uL (ref 150–400)
RBC: 4.88 MIL/uL (ref 3.87–5.11)
RDW: 13.8 % (ref 11.5–15.5)
WBC: 5 10*3/uL (ref 4.0–10.5)

## 2015-10-17 LAB — THYROID PROFILE - CHCC
Free Thyroxine Index: 1.9 (ref 1.4–3.8)
T3 Uptake: 22 % (ref 22–35)
T4 TOTAL: 8.7 ug/dL (ref 4.5–12.0)

## 2015-10-17 NOTE — Progress Notes (Signed)
Patient ID: Abigail [name], female    DOB: [birth year], 54 y.o.   MRN: [id number]  PCP: Reginia Forts, MD  Subjective:   Chief Complaint  Patient presents with  . Follow-up  . Lupus  . SJOGREN'S Syndrome  . Labwork    HPI Presents for labs and CXR.  Sees her Rheumatologist at [facility] in 2 weeks. Likes to take her lab results with her. She got off schedule with Dr. Tamala Julian, so needed to see me today.  CBC CMET Thyroid panel ESR CXR (had a severe asthma problem in September-had to go to the hospital-and then had more problems last week)  She developed a URI and was seen last week. Has taken a Zpak and is improving, though is not yet well. Has a bit of persistent cough. Non-productive. Had a TSH this spring, but is having some symptoms that she read may be related to underactive thyroid and wants a full panel. Notes cold feet, fatigue. Lump that comes and goes in the neck that is not painful. Urinary leakage continues. She plans to discuss surgery with her GYN.  Review of Systems As above.    Patient Active Problem List   Diagnosis Date Noted  . Candidiasis, mouth 04/03/2015  . DDD (degenerative disc disease), cervical 07/05/2014  . Left arm pain 06/14/2014  . Unstable angina ([place]) 06/14/2014  . OSA on CPAP 06/07/2014  . Obesity (BMI 30-39.9) 06/07/2014  . SS-B antibody positive 06/07/2014  . Menopause 10/18/2012  . Essential hypertension, benign 10/18/2012  . Melanoma ([place]) 10/02/2012  . SLE (systemic lupus erythematosus) ([place]) 08/17/2012  . Dysplastic nevus 08/04/2012  . ADD (attention deficit disorder) 02/26/2012  . Depression with anxiety 02/26/2012  . Insomnia 02/26/2012  . Reactive airways 01/20/2012  . DIVERTICULITIS, COLON 10/03/2010  . GERD 01/08/2010  . [place] SYNDROME 01/08/2010  . CHOLECYSTECTOMY, HX OF 01/08/2010     Prior to Admission medications   Medication Sig Start Date End Date Taking? Authorizing Provider  albuterol (PROAIR HFA) 108 (90  BASE) MCG/ACT inhaler Use 2 puffs every 6 hours as needed for shortness of breath 10/10/15  Yes Orma Flaming, MD  amphetamine-dextroamphetamine (ADDERALL) 10 MG tablet Take 5-10 mg by mouth daily as needed. concentration 08/16/13  Yes Historical Provider, MD  azithromycin (ZITHROMAX) 250 MG tablet Take 2 tabs po now then 1 tab po daily 10/12/15  Yes Thao P Le, DO  benzonatate (TESSALON) 100 MG capsule Take 2 capsules (200 mg total) by mouth 2 (two) times daily as needed. 10/12/15  Yes Thao P Le, DO  estradiol (ESTRACE) 1 MG tablet Take 1 tablet (1 mg total) by mouth daily. Patient taking differently: Take 1 mg by mouth daily as needed (hormones).  11/01/14  Yes Barton Fanny, MD  fluconazole (DIFLUCAN) 150 MG tablet TAKE 1 TABLET BY MOUTH ONCE. 09/04/15  Yes Wardell Honour, MD  fluticasone [facility]) 50 MCG/ACT nasal spray Place 2 sprays into both nostrils daily. 10/12/15  Yes Thao P Le, DO  HYDROcodone-homatropine (HYCODAN) 5-1.5 MG/5ML syrup Take 5 mLs by mouth at bedtime as needed for cough. 10/12/15  Yes Thao P Le, DO  LORazepam (ATIVAN) 1 MG tablet TAKE 1 TABLET TWICE DAILY AS NEEDED FOR ANXIETY. 08/14/15  Yes Robyn Haber, MD  losartan (COZAAR) 100 MG tablet TAKE 1 TABLET IN THE MORNING. 05/17/15  Yes Wardell Honour, MD  methylphenidate (RITALIN) 10 MG tablet Take 1 tablet (10 mg total) by mouth 2 (two) times daily. Patient taking differently: Take  10 mg by mouth 2 (two) times daily as needed (concentration).  04/03/15  Yes Wardell Honour, MD  potassium chloride (KLOR-CON) 20 MEQ packet Take 20 mEq by mouth daily as needed. Take when potassium is low 06/07/14  Yes Barton Fanny, MD  potassium chloride SA (K-DUR,KLOR-CON) 20 MEQ tablet TAKE 1/2 TABLET TWICE DAILY AS NEEDED. 07/18/15  Yes Orma Flaming, MD  ranitidine (ZANTAC) 75 MG tablet Take 75 mg by mouth daily.    Yes Historical Provider, MD  sertraline (ZOLOFT) 100 MG tablet Take 1 tablet by mouth daily. 02/16/15  Yes Barton Fanny,  MD  verapamil (VERELAN PM) 120 MG 24 hr capsule Take 1 capsule (120 mg total) by mouth at bedtime. 11/01/14  Yes Barton Fanny, MD  zolpidem (AMBIEN) 10 MG tablet TAKE 1 TABLET AT BEDTIME AS NEEDED FOR SLEEP. 07/12/15  Yes Wardell Honour, MD  amphetamine-dextroamphetamine (ADDERALL XR) 20 MG 24 hr capsule Take 1 capsule (20 mg total) by mouth daily as needed (for concentration). 07/19/15   Wardell Honour, MD     Allergies  Allergen Reactions  . Shrimp [Shellfish Allergy] Shortness Of Breath  . Meperidine Hcl Other (See Comments)    tachycardia  . Promethazine Hcl Other (See Comments)    "uncontrolled vomiting"  . Doxycycline Hives  . Latex Other (See Comments)    Breathing problems.  . Sulfonamide Derivatives Hives  . Tetracyclines & Related Rash       Objective:  Physical Exam  Constitutional: She is oriented to person, place, and time. Vital signs are normal. She appears well-developed and well-nourished. She is active and cooperative. No distress.  BP 130/82 mmHg  Pulse 83  Temp(Src) 98.9 F (37.2 C) (Oral)  Resp 16  Ht 5' 5"  (1.651 m)  Wt 218 lb 12.8 oz (99.247 kg)  BMI 36.41 kg/m2  SpO2 98%  HENT:  Head: Normocephalic and atraumatic.  Right Ear: Hearing normal.  Left Ear: Hearing normal.  Eyes: Conjunctivae are normal. No scleral icterus.  Neck: Normal range of motion. Neck supple. No thyromegaly present.  Cardiovascular: Normal rate, regular rhythm and normal heart sounds.   Pulses:      Radial pulses are 2+ on the right side, and 2+ on the left side.  Pulmonary/Chest: Effort normal and breath sounds normal.  Lymphadenopathy:       Head (right side): No tonsillar, no preauricular, no posterior auricular and no occipital adenopathy present.       Head (left side): No tonsillar, no preauricular, no posterior auricular and no occipital adenopathy present.    She has no cervical adenopathy.       Right: No supraclavicular adenopathy present.       Left: No  supraclavicular adenopathy present.  Neurological: She is alert and oriented to person, place, and time. No sensory deficit.  Skin: Skin is warm, dry and intact. No rash noted. No cyanosis or erythema. Nails show no clubbing.  Psychiatric: She has a normal mood and affect. Her speech is normal and behavior is normal. Judgment and thought content normal. Cognition and memory are normal.   CXR: [facility] reading (PRIMARY) by  Dr. Carlota Raspberry. Normal chest xray. No new findings from 06/2014.          Assessment & Plan:   1. SLE (systemic lupus erythematosus) ([place]) 2. SJOGREN'S SYNDROME Update ESR for her rheumatologist to review. - Sedimentation Rate  3. Essential hypertension, benign Controlled. Update labs. - CBC with Differential/Platelet - Comprehensive  metabolic panel - Thyroid Profile  4. Acute lower respiratory infection Normal CXR here today. Reassured.  - DG Chest 2 View; Future   Fara Chute, PA-C Physician Assistant-Certified [facility]

## 2015-10-17 NOTE — Patient Instructions (Signed)
I will contact you with your lab results as soon as they are available.   If you have not heard from me in 2 weeks, please contact me.  The fastest way to get your results is to register for My Chart (see the instructions on the last page of this printout).  Consider Juanita Craver, MD as Dr. Olevia Perches has retired. I also like all the physicians at [facility] now (I haven't met the new ones they are hiring).

## 2015-10-18 LAB — SEDIMENTATION RATE: SED RATE: 10 mm/h (ref 0–30)

## 2015-10-24 ENCOUNTER — Encounter: Payer: Self-pay | Admitting: Physician Assistant

## 2015-10-25 MED ORDER — AZITHROMYCIN 250 MG PO TABS: ORAL_TABLET | ORAL | Status: DC

## 2015-10-25 MED ORDER — FLUCONAZOLE 200 MG PO TABS: 200.0000 mg | ORAL_TABLET | Freq: Every day | ORAL | Status: DC

## 2015-10-26 ENCOUNTER — Encounter: Payer: Self-pay | Admitting: Physician Assistant

## 2015-10-30 ENCOUNTER — Ambulatory Visit (INDEPENDENT_AMBULATORY_CARE_PROVIDER_SITE_OTHER): Payer: Medicare Other | Admitting: Internal Medicine

## 2015-10-30 VITALS — BP 134/90 | HR 80 | Temp 98.1°F | Resp 18 | Ht 65.0 in | Wt 218.8 lb

## 2015-10-30 DIAGNOSIS — H9201 Otalgia, right ear: Secondary | ICD-10-CM | POA: Diagnosis not present

## 2015-10-30 DIAGNOSIS — H918X1 Other specified hearing loss, right ear: Secondary | ICD-10-CM | POA: Diagnosis not present

## 2015-10-30 DIAGNOSIS — J029 Acute pharyngitis, unspecified: Secondary | ICD-10-CM | POA: Diagnosis not present

## 2015-10-30 DIAGNOSIS — H9191 Unspecified hearing loss, right ear: Secondary | ICD-10-CM

## 2015-10-30 LAB — POCT RAPID STREP A (OFFICE): Rapid Strep A Screen: NEGATIVE

## 2015-10-30 NOTE — Progress Notes (Signed)
Patient ID: Abigail [name], female   DOB: 07-29-1961, 54 y.o.   MRN: [id number]   10/30/2015 at 2:12 PM  Abigail [name] / DOB: Jul 02, 1961 / MRN: [id number]  Problem list reviewed and updated by me where necessary.   SUBJECTIVE  Abigail [name] is a 54 y.o. well appearing female presenting for the chief complaint of ear pain.. She has Sjorgrens Syndrome, less severe now, also migrains, hx of TMJ surgery. No fever no cold.    She  has a past medical history of Lupus ([place]); Migraine; Complication of anesthesia; Sjogren's syndrome ([place]); Vasculitis ([place]); Diverticulitis; Gastritis; Duodenitis; Pancreatitis; Fibromyalgia; Perforation bowel ([place]); Asthma; Melanoma ([place]) (2008); Depression; GERD (gastroesophageal reflux disease); Hypertension; Urticaria; Obstructive sleep apnea (12/25/2012); and SLE (systemic lupus erythematosus) ([place]).    Medications reviewed and updated by myself where necessary, and exist elsewhere in the encounter.   Abigail [name] is allergic to shrimp; meperidine hcl; promethazine hcl; doxycycline; latex; sulfonamide derivatives; and tetracyclines & related. She  reports that she has never smoked. She has never used smokeless tobacco. She reports that she drinks alcohol. She reports that she does not use illicit drugs. She  reports that she currently engages in sexual activity and has had female partners. The patient  has past surgical history that includes Melanoma excision; TMJ Arthroplasty (1970); Appendectomy; Cholecystectomy; Pancreas surgery; Abdominoplasty; Liver biopsy; Anterior cervical decomp/discectomy fusion (10/29/2011); Sigmoidectomy (03/2012); Abdominal hysterectomy; Colonoscopy (11/11/2008); Sleep Study (10/11/2012); Colonoscopy (N/A, 09/20/2013); Reduction mammaplasty (Bilateral); Back surgery; left heart catheterization with coronary angiogram (N/A, 06/15/2014); Ultrasound guidance for vascular access (06/15/2014); and Cervical fusion.  Her family history includes Colon cancer in  her paternal uncle; Colon polyps in her brother, mother, and sister; Dementia in her mother; Depression in her sister; Heart disease (age of onset: 56) in her father; Macular degeneration in her father.  Review of Systems  Constitutional: Negative for fever.  Respiratory: Negative for shortness of breath.   Cardiovascular: Negative for chest pain.  Gastrointestinal: Negative for nausea.  Skin: Negative for rash.  Neurological: Negative for dizziness and headaches.    OBJECTIVE  Her  height is 5\' 5"  (1.651 m) and weight is 218 lb 12.8 oz (99.247 kg). Her oral temperature is 98.1 F (36.7 C). Her blood pressure is 134/90 and her pulse is 80. Her respiration is 18 and oxygen saturation is 97%.  The patient's body mass index is 36.41 kg/(m^2).  Physical Exam  Constitutional: She is oriented to person, place, and time. She appears well-developed and well-nourished.  HENT:  Head: Normocephalic.  Right Ear: Hearing, tympanic membrane, external ear and ear canal normal.  Left Ear: Hearing, tympanic membrane, external ear and ear canal normal.  Nose: Nose normal.  Mouth/Throat: Uvula is midline. No uvula swelling. Posterior oropharyngeal erythema present. No oropharyngeal exudate or posterior oropharyngeal edema.  TMJs full rom no tenderness to palpation  No tender teeth  Eyes: Conjunctivae are normal. Pupils are equal, round, and reactive to light. No scleral icterus.  Neck: Normal range of motion. Neck supple. No thyromegaly present.  Cardiovascular: Normal rate.   Respiratory: Effort normal.  GI: She exhibits no distension.  Musculoskeletal: Normal range of motion.  Lymphadenopathy:    She has no cervical adenopathy.  Neurological: She is alert and oriented to person, place, and time. No cranial nerve deficit. She exhibits normal muscle tone. Coordination normal.  Skin: Skin is warm and dry. No rash noted.  Psychiatric: She has a normal mood and affect.    Results for  orders placed  or performed in visit on 10/30/15 (from the past 24 hour(s))  POCT rapid strep A     Status: Normal   Collection Time: 10/30/15  2:09 PM  Result Value Ref Range   Rapid Strep A Screen Negative Negative    ASSESSMENT & PLAN  Emyia was seen today for otalgia.  Diagnoses and all orders for this visit:  Otalgia, right -     POCT rapid strep A  Acute pharyngitis, unspecified etiology -     POCT rapid strep A  Hearing loss associated with syndrome, right -     POCT rapid strep A

## 2015-10-30 NOTE — Patient Instructions (Signed)
Earache An earache, also called otalgia, can be caused by many things. Pain from an earache can be sharp, dull, or burning. The pain may be temporary or constant. Earaches can be caused by problems with the ear, such as infection in either the middle ear or the ear canal, injury, impacted ear wax, middle ear pressure, or a foreign body in the ear. Ear pain can also result from problems in other areas. This is called referred pain. For example, pain can come from a sore throat, a tooth infection, or problems with the jaw or the joint between the jaw and the skull (temporomandibular joint, or TMJ). The cause of an earache is not always easy to identify. Watchful waiting may be appropriate for some earaches until a clear cause of the pain can be found. HOME CARE INSTRUCTIONS Watch your condition for any changes. The following actions may help to lessen any discomfort that you are feeling:  Take medicines only as directed by your health care provider. This includes ear drops.  Apply ice to your outer ear to help reduce pain.  Put ice in a plastic bag.  Place a towel between your skin and the bag.  Leave the ice on for 20 minutes, 2-3 times per day.  Do not put anything in your ear other than medicine that is prescribed by your health care provider.  Try resting in an upright position instead of lying down. This may help to reduce pressure in the middle ear and relieve pain.  Chew gum if it helps to relieve your ear pain.  Control any allergies that you have.  Keep all follow-up visits as directed by your health care provider. This is important. SEEK MEDICAL CARE IF:  Your pain does not improve within 2 days.  You have a fever.  You have new or worsening symptoms. SEEK IMMEDIATE MEDICAL CARE IF:  You have a severe headache.  You have a stiff neck.  You have difficulty swallowing.  You have redness or swelling behind your ear.  You have drainage from your ear.  You have hearing  loss.  You feel dizzy.   This information is not intended to replace advice given to you by your health care provider. Make sure you discuss any questions you have with your health care provider.   Document Released: 06/14/2004 Document Revised: 11/18/2014 Document Reviewed: 05/29/2014 Elsevier Interactive Patient Education 2016 Elsevier Inc.  

## 2015-11-01 DIAGNOSIS — J4520 Mild intermittent asthma, uncomplicated: Secondary | ICD-10-CM | POA: Insufficient documentation

## 2015-11-07 ENCOUNTER — Telehealth: Payer: Self-pay | Admitting: Neurology

## 2015-11-07 DIAGNOSIS — G43809 Other migraine, not intractable, without status migrainosus: Secondary | ICD-10-CM

## 2015-11-07 NOTE — Telephone Encounter (Signed)
Spoke to Dr. Brett Fairy and she gave a verbal order to put in an order for a referral to Dr. Jaynee Eagles for botox consult.  Spoke to pt and advised her that a referral was placed to Dr. Jaynee Eagles to evaluate for botox and our office will call her to set that appt up. Pt verbalized understanding.

## 2015-11-07 NOTE — Telephone Encounter (Signed)
Patient called and stated that Dr. Brett Fairy had suggested possibly seeing one of our doctors for BOTOX for chronic migraine. Will she need a referral before I can schedule her with Dr. Krista Blue or Dr. Jaynee Eagles (patients preference). Also she stated that we could schedule her for the consultation apt at any time and any day. She just wants to get in because she is experiencing a lot of pain.

## 2015-11-16 ENCOUNTER — Ambulatory Visit (INDEPENDENT_AMBULATORY_CARE_PROVIDER_SITE_OTHER): Payer: Medicare Other | Admitting: Neurology

## 2015-11-16 ENCOUNTER — Encounter: Payer: Self-pay | Admitting: Neurology

## 2015-11-16 VITALS — BP 122/82 | HR 88 | Ht 65.0 in | Wt 219.4 lb

## 2015-11-16 DIAGNOSIS — R51 Headache: Secondary | ICD-10-CM | POA: Diagnosis not present

## 2015-11-16 DIAGNOSIS — G43909 Migraine, unspecified, not intractable, without status migrainosus: Secondary | ICD-10-CM

## 2015-11-16 DIAGNOSIS — G43709 Chronic migraine without aura, not intractable, without status migrainosus: Secondary | ICD-10-CM

## 2015-11-16 DIAGNOSIS — R519 Headache, unspecified: Secondary | ICD-10-CM

## 2015-11-16 MED ORDER — KETOROLAC TROMETHAMINE 60 MG/2ML IM SOLN
60.0000 mg | Freq: Once | INTRAMUSCULAR | Status: AC
  Administered 2015-11-16: 60 mg via INTRAMUSCULAR

## 2015-11-16 NOTE — Patient Instructions (Signed)
Overall you are doing fairly well but I do want to suggest a few things today:   Remember to drink plenty of fluid, eat healthy meals and do not skip any meals. Try to eat protein with a every meal and eat a healthy snack such as fruit or nuts in between meals. Try to keep a regular sleep-wake schedule and try to exercise daily, particularly in the form of walking, 20-30 minutes a day, if you can.   As far as your medications are concerned, I would like to suggest: Onzetra for acute management  I would like to see you back for botox, sooner if we need to. Please call us with any interim questions, concerns, problems, updates or refill requests.   Please also call us for any test results so we can go over those with you on the phone.  My clinical assistant and will answer any of your questions and relay your messages to me and also relay most of my messages to you.   Our phone number is [phone]. We also have an after hours call service for urgent matters and there is a physician on-call for urgent questions. For any emergencies you know to call 911 or go to the nearest emergency room

## 2015-11-16 NOTE — Progress Notes (Signed)
[facility]    Provider:  Dr Jaynee Eagles Referring Provider: Wardell Honour, MD Primary Care Physician:  Reginia Forts, MD  CC:  Migraine  HPI:  Abigail [name] is a 55 y.o. female here as a referral from Dr. Tamala Julian for Migraine. She has a PMHx of Lupus, Sjogren's syndrome, Migraine, Vasculitis, Pancreatitis, Fibromyalgia, Depression, OSA. She is here for evaluation of her Migraines. She hs had bad migraines all her life. She has tried miultiple triptans. She has been on Depakote and that stopped working and she stopped taking it. She had an MRI last year and it was normal. Migraines started since the age of 5. She had cosmetic botox and it helped her migraines. She has cervical muscle pain. No aura. It starts on the left side then spreads bilaterally.She has light sensitivity, sound sensitivity, nausea, has to close all the blinds, has vomited in the past. It is throbbing, pulsating, like someone is hitting your head from the inside. She gets confused during the migraines. The migraines are daily. They can last up to 24 hours. 10/10 severity. Daily migraines for over a year. She does not take OTC medications, no medication overuse. She has examined all food triggers, she went through biofeedback training. 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  Review of Systems: Patient complains of symptoms per HPI as well as the following symptoms: Fatigue, spinning sensation, incontinence, headache, dizziness. Pertinent negatives per HPI. All others negative.   Social History   Social History  . Marital Status: Married    Spouse Name: Abigail [name]  . Number of Children: 2  . Years of Education: Bachelors   Occupational History  .  [employer]   Social History Main Topics  . Smoking  status: Never Smoker   . Smokeless tobacco: Never Used  . Alcohol Use: Yes     Comment: 01/20/12 once/month  . Drug Use: No  . Sexual Activity:    Partners: Male   Other Topics Concern  . Not on file   Social History Narrative   Marital status: married x 28 years to [place]; happily married; no abuse      Children: 2 children (50 yo son, 65 yo daughter); no grandchildren      Lives with: husband, daughter      Employment:  [employer] 2009-2013.  Resigned 08/2012.  Bachelor degrees in communication.     Patient works as a Surveyor, minerals.   Patient drinks 2 caffeine beverages daily.   Patient is left -handed.         Tobacco:  None      Alcohol:  Once weekly; wine      Drugs: none      Exercise:  none                Family History  Problem Relation Age of Onset  . Colon cancer Paternal Uncle   . Colon polyps Sister   . Depression Sister     suicide  . Colon polyps Brother   . Colon polyps Mother   . Dementia Mother   . Heart disease Father 14    CHF  . Macular degeneration Father     Past Medical History  Diagnosis Date  . Lupus ([place])   . Migraine   . Complication of anesthesia  Sjogrens Syndrome  . Sjogren's syndrome ([place])   . 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 Diverticulitis   . Gastritis   . Duodenitis   . Pancreatitis   . Fibromyalgia   . Perforation bowel ([place])     "microperforation"  . Asthma   . Melanoma ([place]) 2008    Back; [place].  . Depression   . GERD (gastroesophageal reflux disease)   . Hypertension   . Urticaria   . Obstructive sleep apnea 12/25/2012  . SLE (systemic lupus erythematosus) ([place])     Past Surgical History  Procedure Laterality Date  . Melanoma excision    . Tmj arthroplasty  1970  . Appendectomy    . Cholecystectomy    . Pancreas surgery      sphincterotomy  . Abdominoplasty    . Liver biopsy    . Anterior cervical decomp/discectomy fusion  10/29/2011    Procedure: ANTERIOR CERVICAL DECOMPRESSION/DISCECTOMY FUSION 2 LEVELS;   Surgeon: Floyce Stakes;  Location: [facility];  Service: Neurosurgery;  Laterality: N/A;  Cervical five-six,Cervical six-seven nterior cervical decompression/diskectomy, fusion, plate  . Sigmoidectomy  03/2012    done at [facility]  . Abdominal hysterectomy      Partial; Ovaries intact.  DUB/fibroids.  . Colonoscopy  11/11/2008  . Sleep study  10/11/2012    +severe OSA.  [facility].  . Colonoscopy N/A 09/20/2013    Procedure: COLONOSCOPY;  Surgeon: Lafayette Dragon, MD;  Location: [facility];  Service: Endoscopy;  Laterality: N/A;  . Reduction mammaplasty Bilateral   . Back surgery    . Left heart catheterization with coronary angiogram N/A 06/15/2014    Procedure: LEFT HEART CATHETERIZATION WITH CORONARY ANGIOGRAM;  Surgeon: Peter M Martinique, MD;  Location: [facility] CATH LAB;  Service: Cardiovascular;  Laterality: N/A;  . Ultrasound guidance for vascular access  06/15/2014    Procedure: ULTRASOUND GUIDANCE FOR VASCULAR ACCESS;  Surgeon: Peter M Martinique, MD;  Location: [facility] CATH LAB;  Service: Cardiovascular;;  . Cervical fusion      Current Outpatient Prescriptions  Medication Sig Dispense Refill  . albuterol (PROAIR HFA) 108 (90 BASE) MCG/ACT inhaler Use 2 puffs every 6 hours as needed for shortness of breath 8.5 g 0  . amphetamine-dextroamphetamine (ADDERALL XR) 20 MG 24 hr capsule Take 1 capsule (20 mg total) by mouth daily as needed (for concentration). 30 capsule 0  . amphetamine-dextroamphetamine (ADDERALL) 10 MG tablet Take 5-10 mg by mouth daily as needed. concentration    . estradiol (ESTRACE) 1 MG tablet Take 1 tablet (1 mg total) by mouth daily. (Patient taking differently: Take 1 mg by mouth daily as needed (hormones). ) 30 tablet 2  . fluconazole (DIFLUCAN) 150 MG tablet TAKE 1 TABLET BY MOUTH ONCE. 7 tablet 0  . fluconazole (DIFLUCAN) 200 MG tablet Take 1 tablet (200 mg total) by mouth daily. 7 tablet 2  . fluorometholone (FML) 0.1 % ophthalmic suspension   1  . fluticasone (FLONASE) 50  MCG/ACT nasal spray Place 2 sprays into both nostrils daily. 16 g 6  . LORazepam (ATIVAN) 1 MG tablet TAKE 1 TABLET TWICE DAILY AS NEEDED FOR ANXIETY. 60 tablet 2  . losartan (COZAAR) 100 MG tablet TAKE 1 TABLET IN THE MORNING. 90 tablet 1  . methylphenidate (RITALIN) 10 MG tablet Take 1 tablet (10 mg total) by mouth 2 (two) times daily. (Patient not taking: Reported on 10/30/2015) 60 tablet 0  . potassium chloride (KLOR-CON) 20 MEQ packet Take 20 mEq by  mouth daily as needed. Take when potassium is low    . ranitidine (ZANTAC) 75 MG tablet Take 75 mg by mouth daily.     . sertraline (ZOLOFT) 100 MG tablet Take 1 tablet by mouth daily. (Patient not taking: Reported on 10/30/2015) 90 tablet 1  . verapamil (VERELAN PM) 120 MG 24 hr capsule Take 1 capsule (120 mg total) by mouth at bedtime. 90 capsule 3  . zolpidem (AMBIEN) 10 MG tablet TAKE 1 TABLET AT BEDTIME AS NEEDED FOR SLEEP. 90 tablet 0  . [DISCONTINUED] montelukast (SINGULAIR) 10 MG tablet Take 1 tablet (10 mg total) by mouth at bedtime. (Patient not taking: Reported on 07/15/2015) 30 tablet 3   No current facility-administered medications for this visit.    Allergies as of 11/16/2015 - Review Complete 10/30/2015  Allergen Reaction Noted  . Shrimp [shellfish allergy] Shortness Of Breath 11/14/2012  . Meperidine hcl Other (See Comments)   . Promethazine hcl Other (See Comments) 10/28/2011  . Doxycycline Hives   . Latex Other (See Comments) 10/28/2011  . Sulfonamide derivatives Hives   . Tetracyclines & related Rash 01/18/2012    Vitals: There were no vitals taken for this visit. Last Weight:  Wt Readings from Last 1 Encounters:  10/30/15 218 lb 12.8 oz (99.247 kg)   Last Height:   Ht Readings from Last 1 Encounters:  10/30/15 5\' 5"  (1.651 m)    Physical exam: Exam: Gen: NAD Eyes: Conjunctivae clear without exudates or hemorrhage  Neuro: Detailed Neurologic Exam  Speech:    Speech is normal; fluent and spontaneous with  normal comprehension.  Cognition:    The patient is oriented to person, place, and time;  Cranial Nerves:    The pupils are equal, round, and reactive to light. The fundi are flat.  Visual fields are full to finger confrontation. Extraocular movements are intact. Trigeminal sensation is intact and the muscles of mastication are normal. The face is symmetric. The palate elevates in the midline. Hearing intact. Voice is normal. Shoulder shrug is normal. The tongue has normal motion without fasciculations.   Motor Observation:    No asymmetry, no atrophy, and no involuntary movements noted. Tone:    Normal muscle tone.    Posture:    Posture is normal. normal erect    Strength:    Strength is V/V in the upper and lower limbs.           Assessment/Plan:  55 year old patient here with chronic migraines. She has tried most first line medications still has daily migraines. Feels she is a good candidate for Botox for migraine.  To prevent or relieve headaches, try the following: Cool Compress. Lie down and place a cool compress on your head.  Avoid headache triggers. If certain foods or odors seem to have triggered your migraines in the past, avoid them. A headache diary might help you identify triggers.  Include physical activity in your daily routine. Try a daily walk or other moderate aerobic exercise.  Manage stress. Find healthy ways to cope with the stressors, such as delegating tasks on your to-do list.  Practice relaxation techniques. Try deep breathing, yoga, massage and visualization.  Eat regularly. Eating regularly scheduled meals and maintaining a healthy diet might help prevent headaches. Also, drink plenty of fluids.  Follow a regular sleep schedule. Sleep deprivation might contribute to headaches Consider biofeedback. With this mind-body technique, you learn to control certain bodily functions - such as muscle tension, heart rate and blood pressure - to  prevent headaches or  reduce headache pain.    Proceed to emergency room if you experience new or worsening symptoms or symptoms do not resolve, if you have new neurologic symptoms or if headache is severe, or for any concerning symptom.    Sarina Ill, MD  [facility] [address] [place], [place] [address]  Phone [phone] Fax [phone]  A total of 30 minutes was spent face-to-face with this patient. Over half this time was spent on counseling patient on the migraine diagnosis and different diagnostic and therapeutic options available.

## 2015-11-16 NOTE — Progress Notes (Signed)
Gave 60mg /55ml to pt in right deltoid. Cleaned with alcohol wipe before injection. Pt tolerated well. Applied band-aid.

## 2015-11-17 ENCOUNTER — Other Ambulatory Visit: Payer: Self-pay | Admitting: Internal Medicine

## 2015-11-20 ENCOUNTER — Other Ambulatory Visit: Payer: Self-pay | Admitting: Family Medicine

## 2015-11-21 ENCOUNTER — Other Ambulatory Visit: Payer: Self-pay | Admitting: Neurology

## 2015-11-21 ENCOUNTER — Other Ambulatory Visit: Payer: Self-pay

## 2015-11-21 MED ORDER — BUTALBITAL-APAP-CAFFEINE 50-325-40 MG PO TABS: 1.0000 | ORAL_TABLET | Freq: Four times a day (QID) | ORAL | Status: DC | PRN

## 2015-11-21 MED ORDER — METOCLOPRAMIDE HCL 10 MG PO TABS: 10.0000 mg | ORAL_TABLET | Freq: Three times a day (TID) | ORAL | Status: DC | PRN

## 2015-11-21 NOTE — Addendum Note (Signed)
Addended by: Hope Pigeon on: 11/21/2015 06:29 PM   Modules accepted: Medications

## 2015-11-21 NOTE — Telephone Encounter (Signed)
Pharm faxed request for RF of Adderall ER 20 QD. Pended.

## 2015-11-21 NOTE — Telephone Encounter (Signed)
Called pt back. LVM. Advised per Dr Jaynee Eagles, she will call pt to discuss. Gave [facility] phone number.

## 2015-11-21 NOTE — Telephone Encounter (Signed)
Spoke with the patient and scheduled her botox injection. She has requested to speak with the nurse regarding her head ache. She is experiencing severe pain today and wants to know if there is anything that can be done or anything she can take. She states that her migraine is much worse than it normally is. Please call and advise.

## 2015-11-21 NOTE — Telephone Encounter (Signed)
Called and spoke to pt. Asked if she has tried Reglan before. She stated "Yes, Dr Erling Cruz would have had me try this". Asked her if it worked. She stated "I would have kept taking it if it worked". She was not sure.

## 2015-11-22 ENCOUNTER — Encounter: Payer: Self-pay | Admitting: Family Medicine

## 2015-11-22 ENCOUNTER — Encounter: Payer: Self-pay | Admitting: *Deleted

## 2015-11-22 ENCOUNTER — Telehealth: Payer: Self-pay | Admitting: Neurology

## 2015-11-22 DIAGNOSIS — F419 Anxiety disorder, unspecified: Secondary | ICD-10-CM

## 2015-11-22 NOTE — Telephone Encounter (Signed)
Spoke with patient and informed her to arrive between 2-2:30 pm today for migraine infusion. She verbalized understanding, appreciation.

## 2015-11-22 NOTE — Progress Notes (Signed)
Verified allergies w/ pt, name, DOB. 22G IV started in right arm at 1450. Attempts: 1 attempt. Cleaned with chlorhexidine prior to insertion. Site clean, dry and intact. Used paper tape d/t allergy to latex. Pt vitals stable prior to infusion. BP 118/80 , pulse 111 , temp 97.6. Headache rated at "5/10" on pain scale. Gave IV push compazine 10mg /14mL over 2 minutes (lot: [id number], expiration: 12/2016, [id number]). Flushed before giving ketorolac. IV push Ketorolac 30mg /mL over 2 minutes (lot: [id number], expiration: September 11, 2016, NDC: [phone]). Diluted with saline flush. Flushed before Depacon push. IV push Depacon 500mg /80ml x2 over 5 minutes (lot: [id number], expiration: 01/2018, [id number]). Flushed with 0.9% sodium chloride after Depacon IV push. Gave IVPB Soulmedrol 500mg  (lot: [id number], expiration: 02/2019, NDC: [id number]). over 30 minutes w/ 100 mL 0.9% sodium chloride bag (Lot: [id number], expiration 09/2016, NDC: [id number]). Infusion stopped at 1555. D/C'd IV in right arm at 1557. Catheter tip intact upon removal. Pt tolerated well. IV site clean and intact. No redness. Pt rated headache at " 1/10" on pain scale. Vitals taken at 1558. BP 111/76  pulse 86, and temperature 97 degrees. Husband with pt and driving pt home. Pt stable upon leaving. Dr Jaynee Eagles okay for pt to go home.

## 2015-11-22 NOTE — Telephone Encounter (Signed)
Patient is calling. Abigail [name] says Abigail [name] spoke with Dr. Jaynee Eagles last night and was told if Abigail [name] still had a headache today Abigail [name] should come in today for an appointment. Please call. Thank you,.

## 2015-11-23 MED ORDER — AMPHETAMINE-DEXTROAMPHET ER 20 MG PO CP24
20.0000 mg | ORAL_CAPSULE | Freq: Every day | ORAL | Status: DC | PRN
Start: 2015-11-23 — End: 2016-01-16

## 2015-11-23 NOTE — Telephone Encounter (Signed)
Notified pt ready on VM.

## 2015-11-23 NOTE — Telephone Encounter (Signed)
Call --- Adderall ER refill ready for pick up.

## 2015-11-28 ENCOUNTER — Other Ambulatory Visit: Payer: Self-pay | Admitting: *Deleted

## 2015-11-28 MED ORDER — ONABOTULINUMTOXINA 100 UNITS IJ SOLR: INTRAMUSCULAR | Status: DC

## 2015-11-29 MED ORDER — LORAZEPAM 1 MG PO TABS: ORAL_TABLET | ORAL | Status: DC

## 2015-12-04 ENCOUNTER — Telehealth: Payer: Self-pay | Admitting: Neurology

## 2015-12-04 NOTE — Telephone Encounter (Signed)
[facility] [phone] called regarding BOTOX

## 2015-12-05 ENCOUNTER — Ambulatory Visit: Payer: Medicare Other | Admitting: Neurology

## 2015-12-05 NOTE — Telephone Encounter (Signed)
Returned call and spoke with them this morning.

## 2015-12-07 ENCOUNTER — Ambulatory Visit (INDEPENDENT_AMBULATORY_CARE_PROVIDER_SITE_OTHER): Payer: Medicare Other | Admitting: Neurology

## 2015-12-07 DIAGNOSIS — G43709 Chronic migraine without aura, not intractable, without status migrainosus: Secondary | ICD-10-CM | POA: Diagnosis not present

## 2015-12-07 NOTE — Progress Notes (Signed)
Consent Form Botulism Toxin Injection For Chronic Migraine  Botulism toxin has been approved by the Federal drug administration for treatment of chronic migraine. Botulism toxin does not cure chronic migraine and it may not be effective in some patients.  The administration of botulism toxin is accomplished by injecting a small amount of toxin into the muscles of the neck and head. Dosage must be titrated for each individual. Any benefits resulting from botulism toxin tend to wear off after 3 months with a repeat injection required if benefit is to be maintained. Injections are usually done every 3-4 months with maximum effect peak achieved by about 2 or 3 weeks. Botulism toxin is expensive and you should be sure of what costs you will incur resulting from the injection.  The side effects of botulism toxin use for chronic migraine may include:   -Transient, and usually mild, facial weakness with facial injections  -Transient, and usually mild, head or neck weakness with head/neck injections  -Reduction or loss of forehead facial animation due to forehead muscle              weakness  -Eyelid drooping  -Dry eye  -Pain at the site of injection or bruising at the site of injection  -Double vision  -Potential unknown long term risks  Contraindications: You should not have Botox if you are pregnant, nursing, allergic to albumin, have an infection, skin condition, or muscle weakness at the site of the injection, or have myasthenia gravis, Lambert-Eaton syndrome, or ALS.  It is also possible that as with any injection, there may be an allergic reaction or no effect from the medication. Reduced effectiveness after repeated injections is sometimes seen and rarely infection at the injection site may occur. All care will be taken to prevent these side effects. If therapy is given over a long time, atrophy and wasting in the muscle injected may occur. Occasionally the patient's become refractory to  treatment because they develop antibodies to the toxin. In this event, therapy needs to be modified.  I have read the above information and consent to the administration of botulism toxin.  On file  ______________  _____   _________________  Patient signature     Date   Witness signature       BOTOX PROCEDURE NOTE FOR MIGRAINE HEADACHE    Contraindications and precautions discussed with patient(above). Aseptic procedure was observed and patient tolerated procedure. Procedure performed by Dr. Georgia Dom  The condition has existed for more than 6 months, and pt does not have a diagnosis of ALS, Myasthenia Gravis or Lambert-Eaton Syndrome. Risks and benefits of injections discussed and pt agrees to proceed with the procedure. Written consent obtained  These injections are medically necessary. He receives good benefits from these injections. These injections do not cause sedations or hallucinations which the oral therapies may cause.  Indication/Diagnosis: chronic migraine BOTOX(J0585) injection was performed according to protocol by [employer]. 200 units of BOTOX was dissolved into 4 cc NS.  NDC: [id number]  Type of toxin: Botox  Lot # [id number] EXP: 06/2018   Description of procedure:  The patient was placed in a sitting position. The standard protocol was used for Botox as follows, with 5 units of Botox injected at each site:   -Procerus muscle, midline injection  -Corrugator muscle, bilateral injection  -Frontalis muscle, bilateral injection, with 2 sites each side, medial injection was performed in the upper one third of the frontalis muscle, in the region vertical from the medial inferior  edge of the superior orbital rim. The lateral injection was again in the upper one third of the forehead vertically above the lateral limbus of the cornea, 1.5 cm lateral to the medial injection site.  -Temporalis muscle injection, 4 sites, bilaterally. The first injection was 3 cm  above the tragus of the ear, second injection site was 1.5 cm to 3 cm up from the first injection site in line with the tragus of the ear. The third injection site was 1.5-3 cm forward between the first 2 injection sites. The fourth injection site was 1.5 cm posterior to the second injection site.  -Occipitalis muscle injection, 3 sites, bilaterally. The first injection was done one half way between the occipital protuberance and the tip of the mastoid process behind the ear. The second injection site was done lateral and superior to the first, 1 fingerbreadth from the first injection. The third injection site was 1 fingerbreadth superiorly and medially from the first injection site.  -Cervical paraspinal muscle injection, 2 sites, bilateral knee first injection site was 1 cm from the midline of the cervical spine, 3 cm inferior to the lower border of the occipital protuberance. The second injection site was 1.5 cm superiorly and laterally to the first injection site.  -Trapezius muscle injection was performed at 3 sites, bilaterally. The first injection site was in the upper trapezius muscle halfway between the inflection point of the neck, and the acromion. The second injection site was one half way between the acromion and the first injection site. The third injection was done between the first injection site and the inflection point of the neck.   Will return for repeat injection in 3 months.   200 unit of Botox was used, 155 units were injected, the rest of the Botox was wasted. The patient tolerated the procedure well, there were no complications of the above procedure.

## 2015-12-12 NOTE — Telephone Encounter (Signed)
Pt called said she has looked into how the insurance will work with Botox. Please call her, she said it was a lengthy discussion.

## 2015-12-18 ENCOUNTER — Encounter: Payer: Self-pay | Admitting: Neurology

## 2015-12-18 ENCOUNTER — Ambulatory Visit (INDEPENDENT_AMBULATORY_CARE_PROVIDER_SITE_OTHER): Payer: Medicare Other | Admitting: Neurology

## 2015-12-18 VITALS — BP 118/82 | HR 80 | Resp 20 | Ht 65.0 in | Wt 215.0 lb

## 2015-12-18 DIAGNOSIS — E661 Drug-induced obesity: Secondary | ICD-10-CM | POA: Insufficient documentation

## 2015-12-18 DIAGNOSIS — G4733 Obstructive sleep apnea (adult) (pediatric): Secondary | ICD-10-CM | POA: Diagnosis not present

## 2015-12-18 DIAGNOSIS — Z9989 Dependence on other enabling machines and devices: Principal | ICD-10-CM

## 2015-12-18 NOTE — Progress Notes (Signed)
[facility]   Provider:  Larey Seat, [place] D  Referring Provider: Wardell Honour, MD Primary Care Physician:  Reginia Forts, MD  Chief Complaint  Patient presents with  . Follow-up    needs supplies from [facility], DID NOT BRING CPAP OR CHIP, rm 11, alone    HPI:  Abigail [name] is a 55 y.o. female , who  seen here as a revisit for new CPAP supplies.      Originally referred by Dr. Tamala Julian for a sleep study in 2013, after she gained weight from Steroid therapy for Lupus and Sicca syndrome.  Her study from 10-2012 documented an AHI of 28.2 and RDI of 37.3 /hr. Nadir 79% . She was titrated to 6 cm water , on a nuance pro mask.  She underwent a chin lift this Summer and was not able to use her CPAP for a couple of weeks until healed.  She now needs a new filter and mask, perhaps a refitting. She had 2 URIs this years.   Plan after this visit: Return to daily CPAP, 6 cm water with new supplies and a refitting for the mask. weight loss strategies.  Ptosis and pupillary asymmetry.   Myasthenia labs ordered. This patient is ANA positive ,SSB positive , RA positive.  Has migraines , the patient has seen Dr. Jaynee Eagles and her last visit at [facility] and received once an IV therapy and now Botox therapy with good results. She appears more optimistic.   The meeting today, 12/18/2015,is for her yearly for a compliance visit. CPAP use almost daily, but forgot to bring the essential data chip. She needs new equipment urgently, has a nasal pillow interface  She had to call her husband today to get the name of her current mask. Uses a ResMed machine, and a FX small nasal pillow, ResMed FX .    Review of Systems: Out of a complete 14 system review, the patient complains of only the following symptoms, and all other reviewed systems are negative:  Sleepiness, snoring, URI. All improved under regular CPAP use  Epworth score is  9 down from 20 and FSS is 33 down  from 54.  Photophobia and migraines.   Social History   Social History  . Marital Status: Married    Spouse Name: [name]  . Number of Children: 2  . Years of Education: Bachelors   Occupational History  .  [employer]   Social History Main Topics  . Smoking status: Never Smoker   . Smokeless tobacco: Never Used  . Alcohol Use: Yes     Comment: 01/20/12 once/month  . Drug Use: No  . Sexual Activity:    Partners: Male   Other Topics Concern  . Not on file   Social History Narrative   Marital status: married x 28 years to [place]; happily married; no abuse      Children: 2 children (16 yo son, 62 yo daughter); no grandchildren      Lives with: husband, daughter      Employment:  [employer] 2009-2013.  Resigned 08/2012.  Bachelor degrees in communication.     Patient works as a Surveyor, minerals.   Patient drinks 2 caffeine beverages daily.   Patient is left -handed.         Tobacco:  None      Alcohol:  Once weekly; wine      Drugs: none      Exercise:  none  Family History  Problem Relation Age of Onset  . Colon cancer Paternal Uncle   . Colon polyps Sister   . Depression Sister     suicide  . Colon polyps Brother   . Colon polyps Mother   . Dementia Mother   . Heart disease Father 38    CHF  . Macular degeneration Father     Past Medical History  Diagnosis Date  . Lupus ([place])   . Migraine   . Complication of anesthesia     Sjogrens Syndrome  . Sjogren's syndrome ([place])   . 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 Diverticulitis   . Gastritis   . Duodenitis   . Pancreatitis   . Fibromyalgia   . Perforation bowel ([place])     "microperforation"  . Asthma   . Melanoma ([place]) 2008    Back; [place].  . Depression   . GERD (gastroesophageal reflux disease)   . Hypertension   . Urticaria   . Obstructive sleep apnea 12/25/2012  . SLE (systemic lupus erythematosus) ([place])     Past Surgical History  Procedure Laterality Date  . Melanoma excision    . Tmj  arthroplasty  1970  . Appendectomy    . Cholecystectomy    . Pancreas surgery      sphincterotomy  . Abdominoplasty    . Liver biopsy    . Anterior cervical decomp/discectomy fusion  10/29/2011    Procedure: ANTERIOR CERVICAL DECOMPRESSION/DISCECTOMY FUSION 2 LEVELS;  Surgeon: Floyce Stakes;  Location: [facility];  Service: Neurosurgery;  Laterality: N/A;  Cervical five-six,Cervical six-seven nterior cervical decompression/diskectomy, fusion, plate  . Sigmoidectomy  03/2012    done at [facility]  . Abdominal hysterectomy      Partial; Ovaries intact.  DUB/fibroids.  . Colonoscopy  11/11/2008  . Sleep study  10/11/2012    +severe OSA.  [facility].  . Colonoscopy N/A 09/20/2013    Procedure: COLONOSCOPY;  Surgeon: Lafayette Dragon, MD;  Location: [facility];  Service: Endoscopy;  Laterality: N/A;  . Reduction mammaplasty Bilateral   . Back surgery    . Left heart catheterization with coronary angiogram N/A 06/15/2014    Procedure: LEFT HEART CATHETERIZATION WITH CORONARY ANGIOGRAM;  Surgeon: Peter M Martinique, MD;  Location: [facility] CATH LAB;  Service: Cardiovascular;  Laterality: N/A;  . Ultrasound guidance for vascular access  06/15/2014    Procedure: ULTRASOUND GUIDANCE FOR VASCULAR ACCESS;  Surgeon: Peter M Martinique, MD;  Location: [facility] LAB;  Service: Cardiovascular;;  . Cervical fusion      Current Outpatient Prescriptions  Medication Sig Dispense Refill  . albuterol (PROAIR HFA) 108 (90 BASE) MCG/ACT inhaler Use 2 puffs every 6 hours as needed for shortness of breath 8.5 g 0  . amphetamine-dextroamphetamine (ADDERALL XR) 20 MG 24 hr capsule Take 1 capsule (20 mg total) by mouth daily as needed (for concentration). 30 capsule 0  . botulinum toxin Type A (BOTOX) 100 units SOLR injection To be administered by provider in the office into facial and neck muscles every three months. 2 vial 0  . butalbital-acetaminophen-caffeine (FIORICET, ESGIC) 50-325-40 MG tablet Take 1 tablet by mouth every 6  (six) hours as needed for headache. 10 tablet 2  . fluconazole (DIFLUCAN) 200 MG tablet Take 1 tablet (200 mg total) by mouth daily. (Patient taking differently: Take 200 mg by mouth daily as needed. ) 7 tablet 2  . fluorometholone (FML) 0.1 % ophthalmic suspension   1  . fluticasone (  FLONASE) 50 MCG/ACT nasal spray Place 2 sprays into both nostrils daily. 16 g 6  . hydroxychloroquine (PLAQUENIL) 200 MG tablet Take 200 mg by mouth 2 (two) times daily.    Marland Kitchen Lifitegrast (XIIDRA) 5 % SOLN Apply 1 drop to eye daily.    Marland Kitchen LORazepam (ATIVAN) 1 MG tablet TAKE 1 TABLET TWICE DAILY AS NEEDED FOR ANXIETY. 60 tablet 2  . losartan (COZAAR) 100 MG tablet TAKE 1 TABLET IN THE MORNING. 90 tablet 1  . methylphenidate (RITALIN) 10 MG tablet Take 1 tablet (10 mg total) by mouth 2 (two) times daily. 60 tablet 0  . metoCLOPramide (REGLAN) 10 MG tablet Take 1 tablet (10 mg total) by mouth 3 (three) times daily as needed. 30 tablet 2  . omeprazole (PRILOSEC) 40 MG capsule Take 40 mg by mouth daily.    . potassium chloride (KLOR-CON) 20 MEQ packet Take 20 mEq by mouth daily as needed. Take when potassium is low    . potassium chloride SA (K-DUR,KLOR-CON) 20 MEQ tablet TAKE 1/2 TABLET TWICE DAILY AS NEEDED. 90 tablet 3  . verapamil (CALAN-SR) 120 MG CR tablet TAKE 1 CAPSULE AT BEDTIME 90 tablet 0  . zolpidem (AMBIEN) 10 MG tablet TAKE 1 TABLET AT BEDTIME AS NEEDED FOR SLEEP. 90 tablet 0  . [DISCONTINUED] montelukast (SINGULAIR) 10 MG tablet Take 1 tablet (10 mg total) by mouth at bedtime. (Patient not taking: Reported on 07/15/2015) 30 tablet 3   No current facility-administered medications for this visit.    Allergies as of 12/18/2015 - Review Complete 12/18/2015  Allergen Reaction Noted  . Meperidine Hives 11/16/2015  . Shrimp [shellfish allergy] Shortness Of Breath 11/14/2012  . Meperidine hcl Other (See Comments)   . Promethazine hcl Other (See Comments) 10/28/2011  . Doxycycline Hives   . Latex Other (See  Comments) 10/28/2011  . Sulfonamide derivatives Hives   . Tetracyclines & related Rash 01/18/2012    Vitals: BP 118/82 mmHg  Pulse 80  Resp 20  Ht 5\' 5"  (1.651 m)  Wt 215 lb (97.523 kg)  BMI 35.78 kg/m2 Last Weight:  Wt Readings from Last 1 Encounters:  12/18/15 215 lb (97.523 kg)   Last Height:   Ht Readings from Last 1 Encounters:  12/18/15 5\' 5"  (1.651 m)   BMI :  34.11   Physical exam:  General: The patient is awake, alert and appears not in acute distress. The patient is well groomed. Head: Normocephalic, atraumatic. Neck is supple. Mallampati 4  neck circumference: 17 inches .  Cardiovascular:  Regular rate and rhythm , without  murmurs or carotid bruit, and without distended neck veins. Respiratory: Lungs are clear to auscultation. Skin:  Without evidence of edema, or rash Trunk: BMI is  elevated and patient  has normal posture.  Neurologic exam : The patient is awake and alert, oriented to place and time.  Memory subjective described as intact. There is a normal attention span & concentration ability.  Speech is fluent without  dysarthria, dysphonia or aphasia. Mood and affect are appropriate.  Cranial nerves: Pupils are briskly reactive to light.  The left pupil is slightly larger and the right eye has a ptosis. She had BB laser treatment to her facial skin, which is red.   Extraocular movements in vertical and horizontal planes intact and without nystagmus.  Hearing to finger rub intact.  Facial sensation intact to fine touch. Facial motor strength is symmetric and tongue and uvula move midline.  Motor exam:  Bilateral grip strengths  i excellent. .  Assessment:   After physical and neurologic examination, review of laboratory studies, imaging, neurophysiology testing and pre-existing records, assessment is: OSA, currently treated with resolution of excessive daytime sleepiness  Plan:  Treatment plan and additional workup :  Continue to use daily  CPAP 6 cm  water with new supplies and a refitting for the mask. weight loss strategies discussed, she has felt better since she has implemented an exercise regimen she has regained stronger muscle tone. Her BMI has changed.  Ptosis and pupillary asymmetry unchanged . This patient is ANA positive ,SSB positive , RA positive. Has migraines, now controlled on Botox with Dr. Jaynee Eagles.    Simuel Stebner, MD   CC; Georgia Dom, MD and PCP : Sonia Baller, MD    PS : Dr Elder Cyphers.

## 2015-12-18 NOTE — Patient Instructions (Signed)

## 2015-12-21 NOTE — Telephone Encounter (Signed)
Returned patients call and left a VM asking her to call me back.  °

## 2015-12-22 DIAGNOSIS — N393 Stress incontinence (female) (male): Secondary | ICD-10-CM | POA: Insufficient documentation

## 2016-01-05 ENCOUNTER — Encounter: Payer: Self-pay | Admitting: Family Medicine

## 2016-01-15 ENCOUNTER — Other Ambulatory Visit: Payer: Self-pay | Admitting: Family Medicine

## 2016-01-15 ENCOUNTER — Other Ambulatory Visit: Payer: Self-pay

## 2016-01-15 NOTE — Telephone Encounter (Signed)
Pharm sent req for RF of adderall 10 mg, with sig "take 1/2 tab in PM prn". Pt wrote this in her last email to you, "adderall 10mg  NOT XL. i think i want to lower the dose- I know i will get it right. ". I will pend this, but the one that comes up as latest in our system is w/slightly different sig. Don't know if you want to leave it as is or change it to one on fax from pharmacy written in this message.

## 2016-01-16 MED ORDER — AMPHETAMINE-DEXTROAMPHETAMINE 10 MG PO TABS: 5.0000 mg | ORAL_TABLET | Freq: Every day | ORAL | Status: DC | PRN

## 2016-01-16 NOTE — Telephone Encounter (Signed)
Dr. Tamala Julian, does pt need follow up?

## 2016-01-22 ENCOUNTER — Encounter: Payer: Self-pay | Admitting: Family Medicine

## 2016-01-22 NOTE — Telephone Encounter (Addendum)
Pt states she have never received the ADDERALL 10 MG or the LORAZEPAM 1 MG, the pharmacy said they never received and the ADDERALL isn't in the pick up drawer Please call [facility]

## 2016-01-23 ENCOUNTER — Ambulatory Visit (INDEPENDENT_AMBULATORY_CARE_PROVIDER_SITE_OTHER): Payer: Medicare Other | Admitting: Family Medicine

## 2016-01-23 VITALS — BP 118/84 | HR 85 | Temp 97.6°F | Resp 18 | Wt 213.0 lb

## 2016-01-23 DIAGNOSIS — F419 Anxiety disorder, unspecified: Secondary | ICD-10-CM

## 2016-01-23 DIAGNOSIS — J0101 Acute recurrent maxillary sinusitis: Secondary | ICD-10-CM | POA: Diagnosis not present

## 2016-01-23 MED ORDER — CEFTRIAXONE SODIUM 1 G IJ SOLR
1.0000 g | INTRAMUSCULAR | Status: AC
  Administered 2016-01-23: 1 g via INTRAMUSCULAR

## 2016-01-23 MED ORDER — FLUCONAZOLE 200 MG PO TABS: 200.0000 mg | ORAL_TABLET | Freq: Every day | ORAL | Status: DC | PRN

## 2016-01-23 MED ORDER — LEVOFLOXACIN 750 MG PO TABS: 750.0000 mg | ORAL_TABLET | Freq: Every day | ORAL | Status: DC

## 2016-01-23 MED ORDER — AMPHETAMINE-DEXTROAMPHETAMINE 10 MG PO TABS: 10.0000 mg | ORAL_TABLET | Freq: Two times a day (BID) | ORAL | Status: DC

## 2016-01-23 MED ORDER — LORAZEPAM 1 MG PO TABS: ORAL_TABLET | ORAL | Status: DC

## 2016-01-23 NOTE — Progress Notes (Signed)
Subjective:    Patient ID: Abigail [name], female    DOB: May 07, 1961, 55 y.o.   MRN: [id number] By signing my name below, I, Abigail [name], attest that this documentation has been prepared under the direction and in the presence of Delman Cheadle, MD.  Electronically Signed: Zola [name], Medical Scribe. 01/23/2016. 3:17 PM.  Chief Complaint  Patient presents with  . Sinusitis   HPI HPI Comments: Abigail [name] is a 55 y.o. female with a history of Sjogren's syndrome who presents to the [facility] complaining of gradual onset, right-sided sinus symptoms that started about 1 month ago. She believes she has a sinus infection. Patient reports having rhinorrhea with bloody nasal discharge and eye swelling and moistness. She has also had diaphoresis, hot/cold spells (she says these feel like hot flashes but aren't hot flashes as she has not had them in several years) and an area of swelling to the center of her neck, above the sternal notch. Patient also notes she had an external ear infection on her right ear and she has been having some problems with numbness in the ear and dizziness. She had similar symptoms in June 2015 and had been treated successfully treated with 3 shots of Rocephin. She notes she recently had a tooth extracted and had been put on a course of penicillin, then amoxicillin by her dentist. The antibiotics seemed to help her symptoms while she was on it. She has been off of antibiotics for 4 days. Patient denies fever. She is requesting a refill of fluconazole which she takes as needed during courses of antibiotics.  Patient was last put on Z-pak for sinusitis 3 months ago. Uses Flonase regularly. She had been off of Flonase since the fall and recently restarted it today. She has a variety of immune system disorders as well as OSA and asthma.  Patient is also requesting a refill of her Lorazepam, which she has been on for about 20 years and normally takes 1 at night. She  ran out 2 days ago. She notes that she had this refilled by Dr. Tamala Julian, but there was some discrepancy between here and the pharmacy and that the pharmacy never received the prescription. Last lorazepam fill was for #60 on January 12th.  Depression screen [facility] 2/9 01/23/2016 10/30/2015 10/17/2015 10/12/2015 05/03/2015  Decreased Interest 0 0 0 0 0  Down, Depressed, Hopeless 0 0 0 0 0  PHQ - 2 Score 0 0 0 0 0    Past Medical History  Diagnosis Date  . Lupus ([place])   . Migraine   . Complication of anesthesia     Sjogrens Syndrome  . Sjogren's syndrome ([place])   . 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 Diverticulitis   . Gastritis   . Duodenitis   . Pancreatitis   . Fibromyalgia   . Perforation bowel ([place])     "microperforation"  . Asthma   . Melanoma ([place]) 2008    Back; [place].  . Depression   . GERD (gastroesophageal reflux disease)   . Hypertension   . Urticaria   . Obstructive sleep apnea 12/25/2012  . SLE (systemic lupus erythematosus) ([place])    Past Surgical History  Procedure Laterality Date  . Melanoma excision    . Tmj arthroplasty  1970  . Appendectomy    . Cholecystectomy    . Pancreas surgery      sphincterotomy  . Abdominoplasty    . Liver biopsy    .  Anterior cervical decomp/discectomy fusion  10/29/2011    Procedure: ANTERIOR CERVICAL DECOMPRESSION/DISCECTOMY FUSION 2 LEVELS;  Surgeon: Floyce Stakes;  Location: [place];  Service: Neurosurgery;  Laterality: N/A;  Cervical five-six,Cervical six-seven nterior cervical decompression/diskectomy, fusion, plate  . Sigmoidectomy  03/2012    done at [facility]  . Abdominal hysterectomy      Partial; Ovaries intact.  DUB/fibroids.  . Colonoscopy  11/11/2008  . Sleep study  10/11/2012    +severe OSA.  [facility].  . Colonoscopy N/A 09/20/2013    Procedure: COLONOSCOPY;  Surgeon: Lafayette Dragon, MD;  Location: [facility];  Service: Endoscopy;  Laterality: N/A;  . Reduction mammaplasty Bilateral   . Back surgery    .  Left heart catheterization with coronary angiogram N/A 06/15/2014    Procedure: LEFT HEART CATHETERIZATION WITH CORONARY ANGIOGRAM;  Surgeon: Peter M Martinique, MD;  Location: [facility] CATH LAB;  Service: Cardiovascular;  Laterality: N/A;  . Ultrasound guidance for vascular access  06/15/2014    Procedure: ULTRASOUND GUIDANCE FOR VASCULAR ACCESS;  Surgeon: Peter M Martinique, MD;  Location: [facility] CATH LAB;  Service: Cardiovascular;;  . Cervical fusion     Current Outpatient Prescriptions on File Prior to Visit  Medication Sig Dispense Refill  . albuterol (PROAIR HFA) 108 (90 BASE) MCG/ACT inhaler Use 2 puffs every 6 hours as needed for shortness of breath 8.5 g 0  . botulinum toxin Type A (BOTOX) 100 units SOLR injection To be administered by provider in the office into facial and neck muscles every three months. 2 vial 0  . butalbital-acetaminophen-caffeine (FIORICET, ESGIC) 50-325-40 MG tablet Take 1 tablet by mouth every 6 (six) hours as needed for headache. 10 tablet 2  . fluorometholone (FML) 0.1 % ophthalmic suspension   1  . fluticasone (FLONASE) 50 MCG/ACT nasal spray Place 2 sprays into both nostrils daily. 16 g 6  . hydroxychloroquine (PLAQUENIL) 200 MG tablet Take 200 mg by mouth 2 (two) times daily.    Marland Kitchen Lifitegrast (XIIDRA) 5 % SOLN Apply 1 drop to eye daily.    . metoCLOPramide (REGLAN) 10 MG tablet Take 1 tablet (10 mg total) by mouth 3 (three) times daily as needed. 30 tablet 2  . omeprazole (PRILOSEC) 40 MG capsule Take 40 mg by mouth daily.    . potassium chloride (KLOR-CON) 20 MEQ packet Take 20 mEq by mouth daily as needed. Take when potassium is low    . verapamil (CALAN-SR) 120 MG CR tablet TAKE 1 CAPSULE AT BEDTIME 90 tablet 0  . zolpidem (AMBIEN) 10 MG tablet TAKE 1 TABLET AT BEDTIME AS NEEDED FOR SLEEP. 90 tablet 0  . amphetamine-dextroamphetamine (ADDERALL) 10 MG tablet Take 1 tablet (10 mg total) by mouth 2 (two) times daily with a meal. concentration 60 tablet 0  . [DISCONTINUED]  montelukast (SINGULAIR) 10 MG tablet Take 1 tablet (10 mg total) by mouth at bedtime. (Patient not taking: Reported on 07/15/2015) 30 tablet 3   No current facility-administered medications on file prior to visit.   Allergies  Allergen Reactions  . Meperidine Hives  . Shrimp [Shellfish Allergy] Shortness Of Breath  . Meperidine Hcl Other (See Comments)    tachycardia  . Promethazine Hcl Other (See Comments)    "uncontrolled vomiting"  . Doxycycline Hives  . Latex Other (See Comments)    Breathing problems.  . Sulfonamide Derivatives Hives  . Tetracyclines & Related Rash   Family History  Problem Relation Age of Onset  . Colon cancer Paternal Uncle   .  Colon polyps Sister   . Depression Sister     suicide  . Colon polyps Brother   . Colon polyps Mother   . Dementia Mother   . Heart disease Father 83    CHF  . Macular degeneration Father    Social History   Social History  . Marital Status: Married    Spouse Name: [name]  . Number of Children: 2  . Years of Education: Bachelors   Occupational History  .  [place]   Social History Main Topics  . Smoking status: Never Smoker   . Smokeless tobacco: Never Used  . Alcohol Use: Yes     Comment: 01/20/12 once/month  . Drug Use: No  . Sexual Activity:    Partners: Male   Other Topics Concern  . None   Social History Narrative   Marital status: married x 28 years to [place]; happily married; no abuse      Children: 2 children (29 yo son, 22 yo daughter); no grandchildren      Lives with: husband, daughter      Employment:  [employer] 2009-2013.  Resigned 08/2012.  Bachelor degrees in communication.     Patient works as a Surveyor, minerals.   Patient drinks 2 caffeine beverages daily.   Patient is left -handed.         Tobacco:  None      Alcohol:  Once weekly; wine      Drugs: none      Exercise:  none                 Review of Systems  Constitutional: Positive for chills, diaphoresis and fatigue. Negative for fever.  HENT:  Positive for congestion, dental problem, ear discharge, ear pain, facial swelling, mouth sores, nosebleeds, postnasal drip, rhinorrhea and sinus pressure. Negative for trouble swallowing and voice change.   Eyes: Positive for discharge. Negative for pain, redness and visual disturbance.  Gastrointestinal: Negative for vomiting.  Allergic/Immunologic: Positive for environmental allergies and immunocompromised state.  Neurological: Positive for dizziness, facial asymmetry, light-headedness, numbness and headaches. Negative for weakness.  Hematological: Positive for adenopathy.  Psychiatric/Behavioral: Positive for sleep disturbance. Negative for dysphoric mood.       Objective:  BP 118/84 mmHg  Pulse 85  Temp(Src) 97.6 F (36.4 C) (Oral)  Resp 18  Wt 213 lb (96.616 kg)  SpO2 94%  Physical Exam  Constitutional: She is oriented to person, place, and time. She appears well-developed and well-nourished. No distress.  HENT:  Head: Normocephalic and atraumatic.  Right Ear: A middle ear effusion is present.  Left Ear: A middle ear effusion is present.  Mouth/Throat: Oropharynx is clear and moist. No oropharyngeal exudate, posterior oropharyngeal edema or posterior oropharyngeal erythema.  Purulent rhinitis bilaterally. Oropharynx normal.  Eyes: Pupils are equal, round, and reactive to light.  Neck: Neck supple.  Cardiovascular: Normal rate, regular rhythm and normal heart sounds.   No murmur heard. Pulmonary/Chest: Effort normal and breath sounds normal. No respiratory distress. She has no wheezes. She has no rales.  Clear to auscultation bilaterally.   Musculoskeletal: She exhibits no edema.  Lymphadenopathy:       Head (right side): Submandibular adenopathy present.       Head (left side): Submandibular adenopathy present.    She has cervical adenopathy.  Submandibular adenopathy, right worse than left. Anterior cervical adenopathy.  Neurological: She is alert and oriented to person,  place, and time. No cranial nerve deficit.  Skin: Skin is warm  and dry. No rash noted.  Psychiatric: She has a normal mood and affect. Her behavior is normal.  Nursing note and vitals reviewed.         Assessment & Plan:  Patient is very familiar with recurrence of symptoms. If her infectious symptoms are returning, she will return for a 2nd and 3rd Rocephin shot if needed. OK to give as long as no sooner than 24 hours but all within the next 10 days.  1. Acute recurrent maxillary sinusitis   2. Anxiety    Today I have utilized the [place] Controlled Substance Registry's online query to confirm compliance regarding the patient's narcotic pain medications. My review reveals appropriate prescription fills and that [facility] is the sole provider of these medications. Rechecks will occur regularly and the patient is aware of our use of the system.   Meds ordered this encounter  Medications  . losartan (COZAAR) 50 MG tablet    Sig: Take 50 mg by mouth daily.  Marland Kitchen levofloxacin (LEVAQUIN) 750 MG tablet    Sig: Take 1 tablet (750 mg total) by mouth daily.    Dispense:  7 tablet    Refill:  0  . fluconazole (DIFLUCAN) 200 MG tablet    Sig: Take 1 tablet (200 mg total) by mouth daily as needed.    Dispense:  7 tablet    Refill:  2  . LORazepam (ATIVAN) 1 MG tablet    Sig: TAKE 1 TABLET TWICE DAILY AS NEEDED FOR ANXIETY.    Dispense:  60 tablet    Refill:  2  . cefTRIAXone (ROCEPHIN) injection 1 g    Sig:     Order Specific Question:  Antibiotic Indication:    Answer:  Other Indication (list below)    Order Specific Question:  Other Indication:    Answer:  sinusitis    I personally performed the services described in this documentation, which was scribed in my presence. The recorded information has been reviewed and considered, and addended by me as needed.  Delman Cheadle, MD MPH

## 2016-01-23 NOTE — Patient Instructions (Signed)

## 2016-01-23 NOTE — Addendum Note (Signed)
Addended by: Wardell Honour on: 01/23/2016 06:26 PM   Modules accepted: Orders

## 2016-01-23 NOTE — Telephone Encounter (Signed)
Rx for Adderall 10mg  ready for pick up at [address] Dr. Brigitte Pulse provided with Lorazepam rx on 01/23/16.

## 2016-01-25 ENCOUNTER — Emergency Department (HOSPITAL_COMMUNITY)
Admission: EM | Admit: 2016-01-25 | Discharge: 2016-01-26 | Disposition: A | Discharge status: Home / Self Care | Payer: Medicare Other | Attending: Emergency Medicine | Admitting: Emergency Medicine

## 2016-01-25 ENCOUNTER — Encounter (HOSPITAL_COMMUNITY): Payer: Self-pay | Admitting: Emergency Medicine

## 2016-01-25 ENCOUNTER — Ambulatory Visit (INDEPENDENT_AMBULATORY_CARE_PROVIDER_SITE_OTHER): Payer: Medicare Other

## 2016-01-25 DIAGNOSIS — J0101 Acute recurrent maxillary sinusitis: Secondary | ICD-10-CM

## 2016-01-25 DIAGNOSIS — Z7951 Long term (current) use of inhaled steroids: Secondary | ICD-10-CM | POA: Diagnosis not present

## 2016-01-25 DIAGNOSIS — Y9289 Other specified places as the place of occurrence of the external cause: Secondary | ICD-10-CM | POA: Diagnosis not present

## 2016-01-25 DIAGNOSIS — Z91040 Latex allergy status: Secondary | ICD-10-CM | POA: Diagnosis not present

## 2016-01-25 DIAGNOSIS — M329 Systemic lupus erythematosus, unspecified: Secondary | ICD-10-CM | POA: Diagnosis not present

## 2016-01-25 DIAGNOSIS — I10 Essential (primary) hypertension: Secondary | ICD-10-CM | POA: Insufficient documentation

## 2016-01-25 DIAGNOSIS — K219 Gastro-esophageal reflux disease without esophagitis: Secondary | ICD-10-CM | POA: Diagnosis not present

## 2016-01-25 DIAGNOSIS — M3500 Sicca syndrome, unspecified: Secondary | ICD-10-CM | POA: Diagnosis not present

## 2016-01-25 DIAGNOSIS — Z79899 Other long term (current) drug therapy: Secondary | ICD-10-CM | POA: Diagnosis not present

## 2016-01-25 DIAGNOSIS — Y998 Other external cause status: Secondary | ICD-10-CM | POA: Diagnosis not present

## 2016-01-25 DIAGNOSIS — G43909 Migraine, unspecified, not intractable, without status migrainosus: Secondary | ICD-10-CM | POA: Insufficient documentation

## 2016-01-25 DIAGNOSIS — J45909 Unspecified asthma, uncomplicated: Secondary | ICD-10-CM | POA: Diagnosis not present

## 2016-01-25 DIAGNOSIS — Z792 Long term (current) use of antibiotics: Secondary | ICD-10-CM | POA: Insufficient documentation

## 2016-01-25 DIAGNOSIS — Z9889 Other specified postprocedural states: Secondary | ICD-10-CM | POA: Diagnosis not present

## 2016-01-25 DIAGNOSIS — T1592XA Foreign body on external eye, part unspecified, left eye, initial encounter: Secondary | ICD-10-CM | POA: Diagnosis present

## 2016-01-25 DIAGNOSIS — X58XXXA Exposure to other specified factors, initial encounter: Secondary | ICD-10-CM | POA: Diagnosis not present

## 2016-01-25 DIAGNOSIS — Z85820 Personal history of malignant melanoma of skin: Secondary | ICD-10-CM | POA: Diagnosis not present

## 2016-01-25 DIAGNOSIS — Y9389 Activity, other specified: Secondary | ICD-10-CM | POA: Diagnosis not present

## 2016-01-25 DIAGNOSIS — F329 Major depressive disorder, single episode, unspecified: Secondary | ICD-10-CM | POA: Insufficient documentation

## 2016-01-25 MED ORDER — TETRACAINE HCL 0.5 % OP SOLN
2.0000 [drp] | Freq: Once | OPHTHALMIC | Status: AC
  Administered 2016-01-26: 2 [drp] via OPHTHALMIC
  Filled 2016-01-25: qty 4

## 2016-01-25 NOTE — ED Provider Notes (Signed)
History  By signing my name below, I, Marlowe Kays, attest that this documentation has been prepared under the direction and in the presence of Tawnia Schirm, PA-C. Electronically Signed: Marlowe Kays, ED Scribe. 01/25/2016. 11:51 PM.  Chief Complaint  Patient presents with  . Eye Pain   The history is provided by the patient and medical records. No language interpreter was used.    HPI Comments:  Abigail [name] is a 55 y.o. obese female with PMHx of Sjogren's syndrome who presents to the Emergency Department complaining of having a contact lens stuck in her left eye. Pt states she was trying a new kind of contact lenses that she placed about 7 hours ago. She states she could feel the contact lens became dry and she was re-wetting the eye every 30 minutes with saline solution. She denies modifying factors. She denies visual disturbance, fever, chills, nausea or vomiting. Pt's ophthalmologist is Dr. Lucita Ferrara. She denies surgery to the eyes.   Past Medical History  Diagnosis Date  . Lupus ([place])   . Migraine   . Complication of anesthesia     Sjogrens Syndrome  . Sjogren's syndrome ([place])   . 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 Diverticulitis   . Gastritis   . Duodenitis   . Pancreatitis   . Fibromyalgia   . Perforation bowel ([place])     "microperforation"  . Asthma   . Melanoma ([place]) 2008    Back; [place].  . Depression   . GERD (gastroesophageal reflux disease)   . Hypertension   . Urticaria   . Obstructive sleep apnea 12/25/2012  . SLE (systemic lupus erythematosus) ([place])    Past Surgical History  Procedure Laterality Date  . Melanoma excision    . Tmj arthroplasty  1970  . Appendectomy    . Cholecystectomy    . Pancreas surgery      sphincterotomy  . Abdominoplasty    . Liver biopsy    . Anterior cervical decomp/discectomy fusion  10/29/2011    Procedure: ANTERIOR CERVICAL DECOMPRESSION/DISCECTOMY FUSION 2 LEVELS;  Surgeon: Floyce Stakes;   Location: [facility];  Service: Neurosurgery;  Laterality: N/A;  Cervical five-six,Cervical six-seven nterior cervical decompression/diskectomy, fusion, plate  . Sigmoidectomy  03/2012    done at [facility]  . Abdominal hysterectomy      Partial; Ovaries intact.  DUB/fibroids.  . Colonoscopy  11/11/2008  . Sleep study  10/11/2012    +severe OSA.  [facility].  . Colonoscopy N/A 09/20/2013    Procedure: COLONOSCOPY;  Surgeon: Lafayette Dragon, MD;  Location: [facility];  Service: Endoscopy;  Laterality: N/A;  . Reduction mammaplasty Bilateral   . Back surgery    . Left heart catheterization with coronary angiogram N/A 06/15/2014    Procedure: LEFT HEART CATHETERIZATION WITH CORONARY ANGIOGRAM;  Surgeon: Peter M Martinique, MD;  Location: [facility] CATH LAB;  Service: Cardiovascular;  Laterality: N/A;  . Ultrasound guidance for vascular access  06/15/2014    Procedure: ULTRASOUND GUIDANCE FOR VASCULAR ACCESS;  Surgeon: Peter M Martinique, MD;  Location: [facility] CATH LAB;  Service: Cardiovascular;;  . Cervical fusion     Family History  Problem Relation Age of Onset  . Colon cancer Paternal Uncle   . Colon polyps Sister   . Depression Sister     suicide  . Colon polyps Brother   . Colon polyps Mother   . Dementia Mother   . Heart disease Father 28    CHF  .  Macular degeneration Father    Social History  Substance Use Topics  . Smoking status: Never Smoker   . Smokeless tobacco: Never Used  . Alcohol Use: Yes     Comment: 01/20/12 once/month   OB History    No data available     Review of Systems  Constitutional: Negative for fever and chills.  Eyes: Positive for pain. Negative for visual disturbance.  Gastrointestinal: Negative for nausea and vomiting.    Allergies  Meperidine; Shrimp; Meperidine hcl; Promethazine hcl; Doxycycline; Latex; Sulfonamide derivatives; and Tetracyclines & related  Home Medications   Prior to Admission medications   Medication Sig Start Date End Date Taking?  Authorizing Provider  albuterol (PROAIR HFA) 108 (90 BASE) MCG/ACT inhaler Use 2 puffs every 6 hours as needed for shortness of breath 10/10/15   Orma Flaming, MD  amphetamine-dextroamphetamine (ADDERALL) 10 MG tablet Take 1 tablet (10 mg total) by mouth 2 (two) times daily with a meal. concentration 01/23/16   Wardell Honour, MD  botulinum toxin Type A (BOTOX) 100 units SOLR injection To be administered by provider in the office into facial and neck muscles every three months. 11/28/15   Melvenia Beam, MD  butalbital-acetaminophen-caffeine (FIORICET, ESGIC) [phone] MG tablet Take 1 tablet by mouth every 6 (six) hours as needed for headache. 11/21/15   Melvenia Beam, MD  fluconazole (DIFLUCAN) 200 MG tablet Take 1 tablet (200 mg total) by mouth daily as needed. 01/23/16   Shawnee Knapp, MD  fluorometholone (FML) 0.1 % ophthalmic suspension  09/27/15   Historical Provider, MD  fluticasone (FLONASE) 50 MCG/ACT nasal spray Place 2 sprays into both nostrils daily. 10/12/15   Thao P Le, DO  hydroxychloroquine (PLAQUENIL) 200 MG tablet Take 200 mg by mouth 2 (two) times daily. 11/01/15 10/31/16  Historical Provider, MD  levofloxacin (LEVAQUIN) 750 MG tablet Take 1 tablet (750 mg total) by mouth daily. 01/23/16   Shawnee Knapp, MD  Lifitegrast Shirley Friar) 5 % SOLN Apply 1 drop to eye daily.    Historical Provider, MD  LORazepam (ATIVAN) 1 MG tablet TAKE 1 TABLET TWICE DAILY AS NEEDED FOR ANXIETY. 01/23/16   Shawnee Knapp, MD  losartan (COZAAR) 50 MG tablet Take 50 mg by mouth daily.    Historical Provider, MD  metoCLOPramide (REGLAN) 10 MG tablet Take 1 tablet (10 mg total) by mouth 3 (three) times daily as needed. 11/21/15   Melvenia Beam, MD  omeprazole (PRILOSEC) 40 MG capsule Take 40 mg by mouth daily. 11/01/15 10/31/16  Historical Provider, MD  potassium chloride (KLOR-CON) 20 MEQ packet Take 20 mEq by mouth daily as needed. Take when potassium is low 06/07/14   Barton Fanny, MD  verapamil (CALAN-SR) 120 MG  CR tablet TAKE 1 CAPSULE AT BEDTIME 11/21/15   Wardell Honour, MD  zolpidem (AMBIEN) 10 MG tablet TAKE 1 TABLET AT BEDTIME AS NEEDED FOR SLEEP. 07/12/15   Wardell Honour, MD   Triage Vitals: BP 110/81 mmHg  Pulse 96  Temp(Src) 98 F (36.7 C) (Oral)  Resp 18  SpO2 92% Physical Exam  Constitutional: She is oriented to person, place, and time. She appears well-developed and well-nourished.  HENT:  Head: Normocephalic and atraumatic.  Eyes: EOM are normal.  Conjunctiva of the left eye injected. Normal upper and lower lids. Pupils is round and reactive to light and accommodation. Contacts present in the left eye. After removal of the contact, no corneal abrasion noted on fluorescein stain  Neck: Normal range of motion.  Cardiovascular: Normal rate.   Pulmonary/Chest: Effort normal.  Musculoskeletal: Normal range of motion.  Neurological: She is alert and oriented to person, place, and time.  Skin: Skin is warm and dry.  Psychiatric: She has a normal mood and affect. Her behavior is normal.  Nursing note and vitals reviewed.   ED Course  .Foreign Body Removal Date/Time: 01/26/2016 6:55 AM Performed by: Jeannett Senior Authorized by: Jeannett Senior Consent: Verbal consent obtained. Risks and benefits: risks, benefits and alternatives were discussed Consent given by: patient Patient identity confirmed: verbally with patient Time out: Immediately prior to procedure a "time out" was called to verify the correct patient, procedure, equipment, support staff and site/side marked as required. Body area: eye Location details: right conjunctiva Local anesthetic: tetracaine drops Anesthetic total: 2 drops Patient sedated: no Patient restrained: no Localization method: visualized Removal mechanism: fingers. Eye examined with fluorescein. No fluorescein uptake. Complexity: simple 1 objects recovered. Objects recovered: contact lense Post-procedure assessment: foreign body  removed Patient tolerance: Patient tolerated the procedure well with no immediate complications   (including critical care time) DIAGNOSTIC STUDIES: Oxygen Saturation is 92% on RA, low by my interpretation.   COORDINATION OF CARE: 11:50 PM- Will instill tetracaine drops and attempt to remove contact lens. Pt verbalizes understanding and agrees to plan.  Medications  tetracaine (PONTOCAINE) 0.5 % ophthalmic solution 2 drop (not administered)    Labs Review Labs Reviewed - No data to display  Imaging Review No results found. I have personally reviewed and evaluated these images and lab results as part of my medical decision-making.   EKG Interpretation None      MDM   Final diagnoses:  Foreign body of left eye, initial encounter   Patient with stock contact lens in the left eye, history of Sjogren's syndrome. Conjunctiva appears to be irritated. 2 drops of tetracaine applied to the left eye. Contact left was then removed with my hand. Patient has saline drops with her, she will continue to use those as needed. Fluorescein staining showed no corneal abrasion. Will have patient call her ophthalmologist tomorrow to see if they want to see her.  Filed Vitals:   01/25/16 2334 01/25/16 2337  BP: 110/81   Pulse: 96   Temp:  98 F (36.7 C)  TempSrc:  Oral  Resp: 18   SpO2: 92%     I personally performed the services described in this documentation, which was scribed in my presence. The recorded information has been reviewed and is accurate.   Jeannett Senior, PA-C 01/26/16 [place], MD 01/26/16 2256

## 2016-01-25 NOTE — ED Notes (Signed)
Patient reports history of chronic dry eye. Recently started using contacts and has been unable to remove contact from left eye even with re-wetting eye every 30 minutes. Contact is still visible, redness to eyelids, pupils equal and reactive to light.

## 2016-01-26 MED ORDER — FLUORESCEIN SODIUM 1 MG OP STRP
1.0000 | ORAL_STRIP | Freq: Once | OPHTHALMIC | Status: AC
  Administered 2016-01-26: 1 via OPHTHALMIC

## 2016-01-26 NOTE — Discharge Instructions (Signed)
Follow up with your eye doctor as needed. Call tomorrow. Continue saline drops. Return if any issues.    Eye Foreign Body A foreign body refers to any object on the surface of the eye or in the eyeball that should not be there. A foreign body may be a small speck of dirt or dust, a hair or eyelash, a splinter, or any other object.  SIGNS AND SYMPTOMS Symptoms depend on what the foreign body is and where it is in the eye. The most common locations are:   On the inner surface of the upper or lower eyelids or on the covering of the white part of the eye (conjunctiva). Symptoms in this location are:  Pain and irritation, especially when blinking.  The feeling that something is in the eye.  On the surface of the clear covering on the front of the eye (cornea). Symptoms in this location include:  Pain and irritation.   Small "rust rings" around a metallic foreign body.  The feeling that something is in the eye.   Inside the eyeball. Foreign bodies inside the eye may cause:   Great pain.   Immediate loss of vision.   Distortion of the pupil. DIAGNOSIS  Foreign bodies are found during an exam by an eye specialist. Those on the eyelids, conjunctiva, or cornea are usually (but not always) easily found. When a foreign body is inside the eyeball, a cloudiness of the lens (cataract) may form almost right away. This makes it hard for an eye specialist to find the foreign body. Tests may be needed, including ultrasound testing, X-rays, and CT scans. TREATMENT   Foreign bodies on the eyelids, conjunctiva, or cornea are often removed easily and painlessly.  Rust in the cornea may require the use of a drill-like instrument to remove the rust.  If the foreign body has caused a scratch or a rubbing or scraping (abrasion) of the cornea, this may be treated with antibiotic drops or ointment. A pressure patch may be put over your eye.  If the foreign body is inside your eyeball, surgery is  needed right away. This is a medical emergency. Foreign bodies inside the eye threaten vision. A person may even lose his or her eye. HOME CARE INSTRUCTIONS   Take medicines only as directed by your health care provider. Use eye drops or ointment as directed.  If no eye patch was applied:  Keep your eye closed as much as possible.  Do not rub your eye.  Wear dark glasses as needed to protect your eyes from bright light.  Do not wear contact lenses until your eye feels normal again, or as instructed by your health care provider.  Wear a protective eye covering if there is a risk of eye injury. This is important when working with high-speed tools.  If your eye is patched:  Follow your health care provider's instructions for when to remove the patch.  Do notdrive or operate machinery if your eye is patched. Your ability to judge distances is impaired.  Keep all follow-up visits as directed by your health care provider. This is important. SEEK MEDICAL CARE IF:   You have increased pain in your eye.  Your vision gets worse.   You have problems with your eye patch.   You have fluid (discharge) coming from your injured eye.   You have redness and swelling around your affected eye.  MAKE SURE YOU:   Understand these instructions.  Will watch your condition.  Will get  help right away if you are not doing well or get worse.   This information is not intended to replace advice given to you by your health care provider. Make sure you discuss any questions you have with your health care provider.   Document Released: 10/28/2005 Document Revised: 11/18/2014 Document Reviewed: 03/25/2013 Elsevier Interactive Patient Education [employer].

## 2016-01-28 ENCOUNTER — Ambulatory Visit: Payer: Medicare Other | Admitting: Family Medicine

## 2016-01-28 DIAGNOSIS — J0101 Acute recurrent maxillary sinusitis: Secondary | ICD-10-CM

## 2016-01-28 DIAGNOSIS — J0100 Acute maxillary sinusitis, unspecified: Secondary | ICD-10-CM | POA: Diagnosis not present

## 2016-01-28 MED ORDER — CEFTRIAXONE SODIUM 1 G IJ SOLR
1.0000 g | INTRAMUSCULAR | Status: AC
  Administered 2016-01-28: 1 g via INTRAMUSCULAR

## 2016-01-28 MED ORDER — CEFTRIAXONE SODIUM 1 G IJ SOLR: 1.0000 g | Freq: Once | INTRAMUSCULAR | Status: DC

## 2016-01-28 MED ORDER — CEFTRIAXONE SODIUM 1 G IJ SOLR
1.0000 g | INTRAMUSCULAR | Status: DC
Start: 2016-01-28 — End: 2016-01-28

## 2016-01-29 ENCOUNTER — Telehealth: Payer: Self-pay

## 2016-01-29 NOTE — Telephone Encounter (Signed)
Pharm sent notice that diflucan is usually contraindicated in pt's who are on Levaquin due to PT interval prolongation. Do you want to [facility] filling this? Have them instr pt to not take diflucan until done with Levaquin?

## 2016-01-30 NOTE — Telephone Encounter (Signed)
Called pharm to see if they were still waiting to fill the diflucan. Pharm reported that she filled both Rxs but pt is aware to NOT take the diflucan until she finishes the Levaquin. Pt stated that is what she has done in the past.

## 2016-01-30 NOTE — Telephone Encounter (Signed)
Perfect, thanks. Please instruct pt that using the diflucan during a course of levaquin carries an increased risk of triggering an abnormal heart beat rhythm so hold off on the fluconazole until the levaquin course is complete. Thanks.

## 2016-01-31 ENCOUNTER — Encounter: Payer: Self-pay | Admitting: Emergency Medicine

## 2016-01-31 ENCOUNTER — Ambulatory Visit (INDEPENDENT_AMBULATORY_CARE_PROVIDER_SITE_OTHER): Payer: Medicare Other

## 2016-01-31 ENCOUNTER — Telehealth: Payer: Self-pay

## 2016-01-31 ENCOUNTER — Ambulatory Visit (INDEPENDENT_AMBULATORY_CARE_PROVIDER_SITE_OTHER): Payer: Medicare Other | Admitting: Emergency Medicine

## 2016-01-31 VITALS — BP 122/84 | HR 92 | Temp 98.1°F | Resp 16 | Ht 65.0 in | Wt 213.6 lb

## 2016-01-31 DIAGNOSIS — H02401 Unspecified ptosis of right eyelid: Secondary | ICD-10-CM

## 2016-01-31 DIAGNOSIS — D72819 Decreased white blood cell count, unspecified: Secondary | ICD-10-CM | POA: Diagnosis not present

## 2016-01-31 DIAGNOSIS — H9201 Otalgia, right ear: Secondary | ICD-10-CM | POA: Diagnosis not present

## 2016-01-31 LAB — POCT CBC
Granulocyte percent: 50.7 %G (ref 37–80)
HCT, POC: 39.3 % (ref 37.7–47.9)
HEMOGLOBIN: 13.8 g/dL (ref 12.2–16.2)
LYMPH, POC: 1.1 (ref 0.6–3.4)
MCH, POC: 28.6 pg (ref 27–31.2)
MCHC: 35.1 g/dL (ref 31.8–35.4)
MCV: 81.4 fL (ref 80–97)
MID (cbc): 0.3 (ref 0–0.9)
MPV: 8.6 fL (ref 0–99.8)
POC Granulocyte: 1.5 — AB (ref 2–6.9)
POC LYMPH %: 39.1 % (ref 10–50)
POC MID %: 10.2 % (ref 0–12)
Platelet Count, POC: 213 10*3/uL (ref 142–424)
RBC: 4.83 M/uL (ref 4.04–5.48)
RDW, POC: 14.1 %
WBC: 2.9 10*3/uL — AB (ref 4.6–10.2)

## 2016-01-31 LAB — POCT SEDIMENTATION RATE: POCT SED RATE: 27 mm/h — AB (ref 0–22)

## 2016-01-31 NOTE — Progress Notes (Addendum)
By signing my name below, I, Raven Small, attest that this documentation has been prepared under the direction and in the presence of Arlyss Queen, MD.  Electronically Signed: Thea Alken, ED Scribe. 01/30/2016. 8:50 AM.   Chief Complaint:  Chief Complaint  Patient presents with  . Follow-up    ear ache    HPI: Abigail [name] is a 55 y.o. female who reports to [facility] today for a follow up. Pt states she has been having recurrent sinus infection for the past 3 months. She was last treated for a recurrent sinus infection 3/14 with an injection of rocephin and levaquin. Pt finds improvement to sinus infection with the injection of Rocephin but states this only gives her temporary relief for about 36 hours before symptoms return.  She reports hx of mastoiditis but CT Maxillofacial on 04/2014 was normal. She also reports intermittent pain, swelling and numbness to right ear that she's had for a while. She has never been seen by an ENT. She also has drooping of her right eye lid that she states has been on and off for a while. She states she's tested negative for myasthenia gravis in the past. She's received Botox injection in her neck once in January by her neurologist. Her next botox treatment is in 1 month.   Past Medical History  Diagnosis Date  . Lupus ([place])   . Migraine   . Complication of anesthesia     Sjogrens Syndrome  . Sjogren's syndrome ([place])   . 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 Diverticulitis   . Gastritis   . Duodenitis   . Pancreatitis   . Fibromyalgia   . Perforation bowel ([place])     "microperforation"  . Asthma   . Melanoma ([place]) 2008    Back; [place].  . Depression   . GERD (gastroesophageal reflux disease)   . Hypertension   . Urticaria   . Obstructive sleep apnea 12/25/2012  . SLE (systemic lupus erythematosus) ([place])    Past Surgical History  Procedure Laterality Date  . Melanoma excision    . Tmj arthroplasty  1970  . Appendectomy    . Cholecystectomy      . Pancreas surgery      sphincterotomy  . Abdominoplasty    . Liver biopsy    . Anterior cervical decomp/discectomy fusion  10/29/2011    Procedure: ANTERIOR CERVICAL DECOMPRESSION/DISCECTOMY FUSION 2 LEVELS;  Surgeon: Floyce Stakes;  Location: [place] [facility]S;  Service: Neurosurgery;  Laterality: N/A;  Cervical five-six,Cervical six-seven nterior cervical decompression/diskectomy, fusion, plate  . Sigmoidectomy  03/2012    done at [facility]  . Abdominal hysterectomy      Partial; Ovaries intact.  DUB/fibroids.  . Colonoscopy  11/11/2008  . Sleep study  10/11/2012    +severe OSA.  [facility].  . Colonoscopy N/A 09/20/2013    Procedure: COLONOSCOPY;  Surgeon: Lafayette Dragon, MD;  [facility]: [facility];  Service: Endoscopy;  Laterality: N/A;  . Reduction mammaplasty Bilateral   . Back surgery    . Left heart catheterization with coronary angiogram N/A 06/15/2014    Procedure: LEFT HEART CATHETERIZATION WITH CORONARY ANGIOGRAM;  Surgeon: Peter M Martinique, MD;  Location: [facility] C[facility] LAB;  Service: Cardiovascular;  Laterality: N/A;  . Ultrasound guidance for vascular access  06/15/2014    Procedure: ULTRASOUND GUIDANCE FOR VASCULAR ACCESS;  Surgeon: Peter M Martinique, MD;  Location: [facility] CATH LAB;  Service: Cardiovascular;;  . Cervical fusion  Social History   Social History  . Marital Status: Married    Spouse Name: [name]  . Number of Children: 2  . Years of Education: Bachelors   Occupational History  .  [employer]   Social History Main Topics  . Smoking status: Never Smoker   . Smokeless tobacco: Never Used  . Alcohol Use: Yes     Comment: 01/20/12 once/month  . Drug Use: No  . Sexual Activity:    Partners: Male   Other Topics Concern  . Not on file   Social History Narrative   Marital status: married x 28 years to [place]; happily married; no abuse      Children: 2 children (8 yo son, 69 yo daughter); no grandchildren      Lives with: husband, daughter      Employment:  [employer]  2009-2013.  Resigned 08/2012.  Bachelor degrees in communication.     Patient works as a Surveyor, minerals.   Patient drinks 2 caffeine beverages daily.   Patient is left -handed.         Tobacco:  None      Alcohol:  Once weekly; wine      Drugs: none      Exercise:  none               Family History  Problem Relation Age of Onset  . Colon cancer Paternal Uncle   . Colon polyps Sister   . Depression Sister     suicide  . Colon polyps Brother   . Colon polyps Mother   . Dementia Mother   . Heart disease Father 49    CHF  . Macular degeneration Father    Allergies  Allergen Reactions  . Meperidine Hives  . Shrimp [Shellfish Allergy] Shortness Of Breath  . Meperidine Hcl Other (See Comments)    tachycardia  . Promethazine Hcl Other (See Comments)    "uncontrolled vomiting"  . Doxycycline Hives  . Latex Other (See Comments)    Breathing problems.  . Sulfonamide Derivatives Hives  . Tetracyclines & Related Rash   Prior to Admission medications   Medication Sig Start Date End Date Taking? Authorizing Provider  albuterol (PROAIR HFA) 108 (90 BASE) MCG/ACT inhaler Use 2 puffs every 6 hours as needed for shortness of breath 10/10/15   Orma Flaming, MD  amphetamine-dextroamphetamine (ADDERALL) 10 MG tablet Take 1 tablet (10 mg total) by mouth 2 (two) times daily with a meal. concentration 01/23/16   Wardell Honour, MD  botulinum toxin Type A (BOTOX) 100 units SOLR injection To be administered by provider in the office into facial and neck muscles every three months. 11/28/15   Melvenia Beam, MD  butalbital-acetaminophen-caffeine (FIORICET, ESGIC) [phone] MG tablet Take 1 tablet by mouth every 6 (six) hours as needed for headache. 11/21/15   Melvenia Beam, MD  fluconazole (DIFLUCAN) 200 MG tablet Take 1 tablet (200 mg total) by mouth daily as needed. 01/23/16   Shawnee Knapp, MD  fluorometholone (FML) 0.1 % ophthalmic suspension  09/27/15   Historical Provider, MD  fluticasone (FLONASE) 50  MCG/ACT nasal spray Place 2 sprays into both nostrils daily. 10/12/15   Thao P Le, DO  hydroxychloroquine (PLAQUENIL) 200 MG tablet Take 200 mg by mouth 2 (two) times daily. 11/01/15 10/31/16  Historical Provider, MD  levofloxacin (LEVAQUIN) 750 MG tablet Take 1 tablet (750 mg total) by mouth daily. 01/23/16   Shawnee Knapp, MD  Lifitegrast Shirley Friar) 5 %  SOLN Apply 1 drop to eye daily.    Historical Provider, MD  LORazepam (ATIVAN) 1 MG tablet TAKE 1 TABLET TWICE DAILY AS NEEDED FOR ANXIETY. 01/23/16   Shawnee Knapp, MD  losartan (COZAAR) 50 MG tablet Take 50 mg by mouth daily.    Historical Provider, MD  metoCLOPramide (REGLAN) 10 MG tablet Take 1 tablet (10 mg total) by mouth 3 (three) times daily as needed. 11/21/15   Melvenia Beam, MD  omeprazole (PRILOSEC) 40 MG capsule Take 40 mg by mouth daily. 11/01/15 10/31/16  Historical Provider, MD  potassium chloride (KLOR-CON) 20 MEQ packet Take 20 mEq by mouth daily as needed. Take when potassium is low 06/07/14   Barton Fanny, MD  verapamil (CALAN-SR) 120 MG CR tablet TAKE 1 CAPSULE AT BEDTIME 11/21/15   Wardell Honour, MD  zolpidem (AMBIEN) 10 MG tablet TAKE 1 TABLET AT BEDTIME AS NEEDED FOR SLEEP. 07/12/15   Wardell Honour, MD     ROS: The patient denies fevers, chills, night sweats, unintentional weight loss, chest pain, palpitations, wheezing, dyspnea on exertion, nausea, vomiting, abdominal pain, dysuria, hematuria, melena, numbness, weakness, or tingling.   All other systems have been reviewed and were otherwise negative with the exception of those mentioned in the HPI and as above.    PHYSICAL EXAM: There were no vitals filed for this visit. There is no weight on file to calculate BMI.   General: Alert, no acute distress HEENT:  Normocephalic, atraumatic, oropharynx patent. Right sided ptosis. ears normal. Scaring over both mastoids. No adenopathy.  Eye: Juliette Mangle [facility] Cardiovascular:  Regular rate and rhythm, no rubs murmurs or gallops.   No Carotid bruits, radial pulse intact. No pedal edema.  Respiratory: Clear to auscultation bilaterally.  No wheezes, rales, or rhonchi.  No cyanosis, no use of accessory musculature Abdominal: No organomegaly, abdomen is soft and non-tender, positive bowel sounds.  No masses. Musculoskeletal: Gait intact. No edema, tenderness Skin: No rashes. Neurologic: Facial musculature symmetric. Psychiatric: Patient acts appropriately throughout our interaction. Lymphatic: No cervical or submandibular lymphadenopathy    LABS: Results for orders placed or performed in visit on 01/31/16  POCT CBC  Result Value Ref Range   WBC 2.9 (A) 4.6 - 10.2 K/uL   Lymph, poc 1.1 0.6 - 3.4   POC LYMPH PERCENT 39.1 10 - 50 %L   MID (cbc) 0.3 0 - 0.9   POC MID % 10.2 0 - 12 %M   POC Granulocyte 1.5 (A) 2 - 6.9   Granulocyte percent 50.7 37 - 80 %G   RBC 4.83 4.04 - 5.48 M/uL   Hemoglobin 13.8 12.2 - 16.2 g/dL   HCT, POC 39.3 37.7 - 47.9 %   MCV 81.4 80 - 97 fL   MCH, POC 28.6 27 - 31.2 pg   MCHC 35.1 31.8 - 35.4 g/dL   RDW, POC 14.1 %   Platelet Count, POC 213 142 - 424 K/uL   MPV 8.6 0 - 99.8 fL   EKG/XRAY:   Primary read interpreted by Dr. [facility]lene Farrier at [facility]. Dg Sinus 1-2 Views  01/31/2016  CLINICAL DATA:  Sinus pain and pressure. EXAM: PARANASAL SINUSES - 1-2 VIEW COMPARISON:  CT 11/25/2014 . FINDINGS: Paranasal sinuses are clear. No acute bony abnormality. Orbits intact. No focal acute bony abnormality. IMPRESSION: No acute abnormality.  Sinuses appear clear. Electronically Signed   By: Marcello Moores  Register   On: 01/31/2016 09:27    ASSESSMENT/PLAN: Referral made to your nose and throat.  She will contact her rheumatologist Dr. Vernona Rieger. CT maxillofacial ordered. We'll hold off on orbital CT at present. White count at 2900 is suspicious this is related to her connective tissue disease.I personally performed the services described in this documentation, which was scribed in my presence. The recorded information  has been reviewed and is accurate. Patient has a connective tissue disease and is on immunosuppressants.Johney Maine sideeffects, risk and benefits, and alternatives of medications d/w patient. Patient is aware that all medications have potential sideeffects and we are unable to predict every sideeffect or drug-drug interaction that may occur.  Arlyss Queen MD 01/31/2016 8:49 AM

## 2016-01-31 NOTE — Telephone Encounter (Signed)
Update on CT Maxillofacial --  A nurse with [facility] called to inform that the patient's CT has not been approved because a CT Maxillofacial has been done in the past and showed no abnormalities.  A physician-to-physician review is the next step in the process.  Please call [phone].  No case number available.  The patient's member ID is [id number].

## 2016-01-31 NOTE — Patient Instructions (Signed)
     IF you received an x-ray today, you will receive an invoice from [facility]. Please contact [facility] at [phone] with questions or concerns regarding your invoice.   IF you received labwork today, you will receive an invoice from [facility]. Please contact [facility] at [phone] with questions or concerns regarding your invoice.   Our billing staff will not be able to assist you with questions regarding bills from these companies.  You will be contacted with the lab results as soon as they are available. The fastest way to get your results is to activate your My Chart account. Instructions are located on the last page of this paperwork. If you have not heard from us regarding the results in 2 weeks, please contact this office.      

## 2016-02-01 ENCOUNTER — Telehealth: Payer: Self-pay | Admitting: Family Medicine

## 2016-02-01 NOTE — Telephone Encounter (Signed)
Spoke to patient and she would like to wait until she sees the rheumatologist at [facility].  She does not want Dr. Everlene Farrier to pursue the CT scan

## 2016-02-01 NOTE — Telephone Encounter (Signed)
Please call Abigail [name]. Tell her the insurance does not want to pay for her CT.  Does she want to talk to her rheumatologist first. ?? I also can work on an appeal to get the CT approved which I am happy to do. Please let me know.

## 2016-02-02 LAB — ACETYLCHOLINE RECEPTOR, BLOCKING: ACHR Blocking Abs: <15 % inhibit (ref <15)

## 2016-02-04 ENCOUNTER — Telehealth: Payer: Self-pay | Admitting: Radiology

## 2016-02-06 ENCOUNTER — Other Ambulatory Visit: Payer: Self-pay | Admitting: Family Medicine

## 2016-02-07 NOTE — Telephone Encounter (Signed)
Error

## 2016-02-10 ENCOUNTER — Encounter: Payer: Self-pay | Admitting: Family Medicine

## 2016-02-13 ENCOUNTER — Ambulatory Visit (INDEPENDENT_AMBULATORY_CARE_PROVIDER_SITE_OTHER): Payer: Medicare Other | Admitting: Family Medicine

## 2016-02-13 ENCOUNTER — Ambulatory Visit: Payer: Medicare Other | Admitting: Family Medicine

## 2016-02-13 ENCOUNTER — Encounter: Payer: Self-pay | Admitting: Family Medicine

## 2016-02-13 VITALS — BP 99/69 | HR 83 | Temp 98.3°F | Resp 16 | Ht 64.75 in | Wt 215.8 lb

## 2016-02-13 DIAGNOSIS — F988 Other specified behavioral and emotional disorders with onset usually occurring in childhood and adolescence: Secondary | ICD-10-CM

## 2016-02-13 DIAGNOSIS — M3500 Sicca syndrome, unspecified: Secondary | ICD-10-CM | POA: Diagnosis not present

## 2016-02-13 DIAGNOSIS — Z9989 Dependence on other enabling machines and devices: Secondary | ICD-10-CM

## 2016-02-13 DIAGNOSIS — R61 Generalized hyperhidrosis: Secondary | ICD-10-CM | POA: Diagnosis not present

## 2016-02-13 DIAGNOSIS — E669 Obesity, unspecified: Secondary | ICD-10-CM | POA: Diagnosis not present

## 2016-02-13 DIAGNOSIS — I10 Essential (primary) hypertension: Secondary | ICD-10-CM

## 2016-02-13 DIAGNOSIS — M329 Systemic lupus erythematosus, unspecified: Secondary | ICD-10-CM | POA: Diagnosis not present

## 2016-02-13 DIAGNOSIS — F909 Attention-deficit hyperactivity disorder, unspecified type: Secondary | ICD-10-CM

## 2016-02-13 DIAGNOSIS — G4733 Obstructive sleep apnea (adult) (pediatric): Secondary | ICD-10-CM | POA: Diagnosis not present

## 2016-02-13 DIAGNOSIS — R7302 Impaired glucose tolerance (oral): Secondary | ICD-10-CM | POA: Diagnosis not present

## 2016-02-13 DIAGNOSIS — M5030 Other cervical disc degeneration, unspecified cervical region: Secondary | ICD-10-CM | POA: Diagnosis not present

## 2016-02-13 DIAGNOSIS — Z23 Encounter for immunization: Secondary | ICD-10-CM

## 2016-02-13 LAB — POCT GLYCOSYLATED HEMOGLOBIN (HGB A1C): Hemoglobin A1C: 5.8

## 2016-02-13 LAB — TSH: TSH: 1.65 mIU/L

## 2016-02-13 LAB — VITAMIN B12: Vitamin B-12: 496 pg/mL (ref 200–1100)

## 2016-02-13 NOTE — Progress Notes (Signed)
Subjective:    Patient ID: Abigail [name], female    DOB: 1961/04/02, 56 y.o.   MRN: [id number]  02/13/2016  Follow-up   HPI This 55 y.o. female presents for six months follow-up:  1.  Primary Sjogrens and SLE overlap:  Dry eyes, mouth, fatigue, polyarthralgia.  Hydroxycholoroquine 400mg  daly.  CBC and CRP, BMET, u/a obtained on 02/05/16 visit.  Followed up; did not get better after Shaw's visit.  Repeat CBC WNL.  Numbness in R ear; assumes will get better.  Onset with sinusitis; had otitis externa/cellulitis.  Numbness started with infection.  CT sinuses cancelled.    2. HTN: Patient reports good compliance with medication, good tolerance to medication, and good symptom control.  Has cut Losartan 25mg  daily.  Taking Verapamil for migraine prevention.     ADD: Patient reports good compliance with medication, good tolerance to medication, and good symptom control.    Insomnia: Patient reports good compliance with medication, good tolerance to medication, and good symptom control.  Son got divorced within one year of marriage.  Daughter is happily married.    Glucose Intolerance:   Overweight: saw bariatric physician in [place].  Needs EKG, vitamin D, vitamin B12.  Suggested dietary approaches, exercise.  Only physician in [name] who does not recommend surgery.  Uses all alternatives for weight loss.  Migraines: doing botox for migraines.  Having a significant migraine issue.  Sweats: constant; onset in January; no fevers; intermittent chills.  No new medications.  Hardly taking Adderall at all.   Review of Systems  Constitutional: Negative for fever, chills, diaphoresis and fatigue.  Eyes: Negative for visual disturbance.  Respiratory: Negative for cough and shortness of breath.   Cardiovascular: Negative for chest pain, palpitations and leg swelling.  Gastrointestinal: Negative for nausea, vomiting, abdominal pain, diarrhea and constipation.  Endocrine: Negative for cold intolerance, heat  intolerance, polydipsia, polyphagia and polyuria.  Neurological: Negative for dizziness, tremors, seizures, syncope, facial asymmetry, speech difficulty, weakness, light-headedness, numbness and headaches.    Past Medical History  Diagnosis Date  . Lupus ([name])   . Migraine   . Complication of anesthesia     Sjogrens Syndrome  . Sjogren's syndrome ([name])   . 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 Diverticulitis   . Gastritis   . Duodenitis   . Pancreatitis   . Fibromyalgia   . Perforation bowel ([name])     "microperforation"  . Asthma   . Melanoma ([name]) 2008    Back; [place].  . Depression   . GERD (gastroesophageal reflux disease)   . Hypertension   . Urticaria   . Obstructive sleep apnea 12/25/2012  . SLE (systemic lupus erythematosus) ([place])    Past Surgical History  Procedure Laterality Date  . Melanoma excision    . Tmj arthroplasty  1970  . Appendectomy    . Cholecystectomy    . Pancreas surgery      sphincterotomy  . Abdominoplasty    . Liver biopsy    . Anterior cervical decomp/discectomy fusion  10/29/2011    Procedure: ANTERIOR CERVICAL DECOMPRESSION/DISCECTOMY FUSION 2 LEVELS;  Surgeon: Floyce Stakes;  Location: [name] [facility]S;  Service: Neurosurgery;  Laterality: N/A;  Cervical five-six,Cervical six-seven nterior cervical decompression/diskectomy, fusion, plate  . Sigmoidectomy  03/2012    done at [name]  . Abdominal hysterectomy      Partial; Ovaries intact.  DUB/fibroids.  . Colonoscopy  11/11/2008  . Sleep study  10/11/2012    +severe OSA.  [facility].  . Colonoscopy N/A 09/20/2013    Procedure: COLONOSCOPY;  Surgeon: Lafayette Dragon, MD;  Location: [facility];  Service: Endoscopy;  Laterality: N/A;  . Reduction mammaplasty Bilateral   . Back surgery    . Left heart catheterization with coronary angiogram N/A 06/15/2014    Procedure: LEFT HEART CATHETERIZATION WITH CORONARY ANGIOGRAM;  Surgeon: Peter M Martinique, MD;  Location: [facility] CATH LAB;  Service:  Cardiovascular;  Laterality: N/A;  . Ultrasound guidance for vascular access  06/15/2014    Procedure: ULTRASOUND GUIDANCE FOR VASCULAR ACCESS;  Surgeon: Peter M Martinique, MD;  Location: [facility] CATH LAB;  Service: Cardiovascular;;  . Cervical fusion     Allergies  Allergen Reactions  . Meperidine Hives  . Shrimp [Shellfish Allergy] Shortness Of Breath  . Meperidine Hcl Other (See Comments)    tachycardia  . Promethazine Hcl Other (See Comments)    "uncontrolled vomiting"  . Doxycycline Hives  . Latex Other (See Comments)    Breathing problems.  . Sulfonamide Derivatives Hives  . Tetracyclines & Related Rash    Social History   Social History  . Marital Status: Married    Spouse Name: Abigail [name]  . Number of Children: 2  . Years of Education: Bachelors   Occupational History  .  [employer]   Social History Main Topics  . Smoking status: Never Smoker   . Smokeless tobacco: Never Used  . Alcohol Use: Yes     Comment: 01/20/12 once/month  . Drug Use: No  . Sexual Activity:    Partners: Male   Other Topics Concern  . Not on file   Social History Narrative   Marital status: married x 28 years to [place]; happily married; no abuse      Children: 2 children (25 yo son, 61 yo daughter); no grandchildren      Lives with: husband, daughter      Employment:  [employer] 2009-2013.  Resigned 08/2012.  Bachelor degrees in communication.     Patient works as a Surveyor, minerals.   Patient drinks 2 caffeine beverages daily.   Patient is left -handed.         Tobacco:  None      Alcohol:  Once weekly; wine      Drugs: none      Exercise:  none               Family History  Problem Relation Age of Onset  . Colon cancer Paternal Uncle   . Colon polyps Sister   . Depression Sister     suicide  . Colon polyps Brother   . Colon polyps Mother   . Dementia Mother   . Heart disease Father 72    CHF  . Macular degeneration Father        Objective:    BP 99/69 mmHg  Pulse 83  Temp(Src) 98.3 F (36.8 C)  (Oral)  Resp 16  Ht 5' 4.75" (1.645 m)  Wt 215 lb 12.8 oz (97.886 kg)  BMI 36.17 kg/m2  SpO2 94% Physical Exam  Constitutional: She is oriented to person, place, and time. She appears well-developed and well-nourished. No distress.  HENT:  Head: Normocephalic and atraumatic.  Right Ear: External ear normal.  Left Ear: External ear normal.  Nose: Nose normal.  Mouth/Throat: Oropharynx is clear and moist.  Eyes: Conjunctivae and EOM are normal. Pupils are equal, round, and reactive to light.  Neck: Normal range of motion. Neck supple. Carotid bruit is not  present. No thyromegaly present.  Cardiovascular: Normal rate, regular rhythm, normal heart sounds and intact distal pulses.  Exam reveals no gallop and no friction rub.   No murmur heard. Pulmonary/Chest: Effort normal and breath sounds normal. She has no wheezes. She has no rales.  Abdominal: Soft. Bowel sounds are normal. She exhibits no distension and no mass. There is no tenderness. There is no rebound and no guarding.  Lymphadenopathy:    She has no cervical adenopathy.  Neurological: She is alert and oriented to person, place, and time. No cranial nerve deficit.  Skin: Skin is warm and dry. No rash noted. She is not diaphoretic. No erythema. No pallor.  Psychiatric: She has a normal mood and affect. Her behavior is normal.   Results for orders placed or performed in visit on 02/13/16  VITAMIN D 25 Hydroxy (Vit-D Deficiency, Fractures)  Result Value Ref Range   Vit D, 25-Hydroxy 28 (L) 30 - 100 ng/mL  Vitamin B12  Result Value Ref Range   Vitamin B-12 496 200 - 1100 pg/mL  TSH  Result Value Ref Range   TSH 1.65 mIU/L  POCT glycosylated hemoglobin (Hb A1C)  Result Value Ref Range   Hemoglobin A1C 5.8        Assessment & Plan:   1. SLE (systemic lupus erythematosus) ([place])   2. SJOGREN'S SYNDROME   3. OSA on CPAP   4. Obesity (BMI 30-39.9)   5. Essential hypertension, benign   6. DDD (degenerative disc disease),  cervical   7. Glucose intolerance (impaired glucose tolerance)   8. Attention deficit disorder   9. Excessive sweating     Orders Placed This Encounter  Procedures  . Pneumococcal polysaccharide vaccine 23-valent greater than or equal to 2yo subcutaneous/IM  . VITAMIN D 25 Hydroxy (Vit-D Deficiency, Fractures)  . Vitamin B12  . TSH  . POCT glycosylated hemoglobin (Hb A1C)  . EKG 12-Lead   No orders of the defined types were placed in this encounter.    No Follow-up on file.    Krystn Dermody Elayne Guerin, M.D. [facility] [address] [place], [place]  [address] [phone] phone [phone] fax

## 2016-02-13 NOTE — Patient Instructions (Signed)
     IF you received an x-ray today, you will receive an invoice from [facility]. Please contact [facility] at [phone] with questions or concerns regarding your invoice.   IF you received labwork today, you will receive an invoice from [facility]. Please contact [facility] at [phone] with questions or concerns regarding your invoice.   Our billing staff will not be able to assist you with questions regarding bills from these companies.  You will be contacted with the lab results as soon as they are available. The fastest way to get your results is to activate your My Chart account. Instructions are located on the last page of this paperwork. If you have not heard from us regarding the results in 2 weeks, please contact this office.      

## 2016-02-14 LAB — VITAMIN D 25 HYDROXY (VIT D DEFICIENCY, FRACTURES): Vit D, 25-Hydroxy: 28 ng/mL — ABNORMAL LOW (ref 30–100)

## 2016-02-15 ENCOUNTER — Encounter: Payer: Self-pay | Admitting: Family Medicine

## 2016-02-18 NOTE — Telephone Encounter (Signed)
Patient asked for lab release into my chart/ this was done

## 2016-02-19 NOTE — Addendum Note (Signed)
Addended by: Orion Crook on: 02/19/2016 08:45 AM   Modules accepted: Level of Service

## 2016-03-04 ENCOUNTER — Ambulatory Visit: Payer: Medicare Other | Admitting: Physical Medicine & Rehabilitation

## 2016-03-04 ENCOUNTER — Ambulatory Visit: Payer: Medicare Other

## 2016-03-16 ENCOUNTER — Ambulatory Visit (HOSPITAL_BASED_OUTPATIENT_CLINIC_OR_DEPARTMENT_OTHER)
Admission: RE | Admit: 2016-03-16 | Discharge: 2016-03-16 | Disposition: A | Discharge status: Home / Self Care | Payer: Medicare Other | Source: Ambulatory Visit | Attending: Family Medicine | Admitting: Family Medicine

## 2016-03-16 ENCOUNTER — Encounter (HOSPITAL_BASED_OUTPATIENT_CLINIC_OR_DEPARTMENT_OTHER): Payer: Self-pay

## 2016-03-16 ENCOUNTER — Ambulatory Visit (INDEPENDENT_AMBULATORY_CARE_PROVIDER_SITE_OTHER): Payer: Medicare Other

## 2016-03-16 ENCOUNTER — Ambulatory Visit (INDEPENDENT_AMBULATORY_CARE_PROVIDER_SITE_OTHER): Payer: Medicare Other | Admitting: Family Medicine

## 2016-03-16 VITALS — BP 128/72 | HR 84 | Temp 97.8°F | Resp 16 | Ht 65.0 in | Wt 212.8 lb

## 2016-03-16 DIAGNOSIS — R1032 Left lower quadrant pain: Secondary | ICD-10-CM | POA: Diagnosis not present

## 2016-03-16 DIAGNOSIS — K760 Fatty (change of) liver, not elsewhere classified: Secondary | ICD-10-CM | POA: Insufficient documentation

## 2016-03-16 DIAGNOSIS — R350 Frequency of micturition: Secondary | ICD-10-CM | POA: Diagnosis not present

## 2016-03-16 DIAGNOSIS — K5730 Diverticulosis of large intestine without perforation or abscess without bleeding: Secondary | ICD-10-CM | POA: Diagnosis not present

## 2016-03-16 LAB — LIPASE: Lipase: 109 U/L — ABNORMAL HIGH (ref 7–60)

## 2016-03-16 LAB — POCT CBC
Granulocyte percent: 58.5 %G (ref 37–80)
HEMATOCRIT: 40.1 % (ref 37.7–47.9)
HEMOGLOBIN: 13.9 g/dL (ref 12.2–16.2)
LYMPH, POC: 1.4 (ref 0.6–3.4)
MCH, POC: 28.6 pg (ref 27–31.2)
MCHC: 34.7 g/dL (ref 31.8–35.4)
MCV: 82.2 fL (ref 80–97)
MID (cbc): 0.4 (ref 0–0.9)
MPV: 8.1 fL (ref 0–99.8)
PLATELET COUNT, POC: 195 10*3/uL (ref 142–424)
POC Granulocyte: 2.5 (ref 2–6.9)
POC LYMPH %: 32.9 % (ref 10–50)
POC MID %: 8.6 %M (ref 0–12)
RBC: 4.87 M/uL (ref 4.04–5.48)
RDW, POC: 13.8 %
WBC: 4.2 10*3/uL — AB (ref 4.6–10.2)

## 2016-03-16 LAB — POC MICROSCOPIC URINALYSIS (UMFC): MUCUS RE: ABSENT

## 2016-03-16 LAB — COMPREHENSIVE METABOLIC PANEL
ALBUMIN: 4.4 g/dL (ref 3.6–5.1)
ALT: 36 U/L — AB (ref 6–29)
AST: 31 U/L (ref 10–35)
Alkaline Phosphatase: 78 U/L (ref 33–130)
BILIRUBIN TOTAL: 0.3 mg/dL (ref 0.2–1.2)
BUN: 22 mg/dL (ref 7–25)
CHLORIDE: 107 mmol/L (ref 98–110)
CO2: 24 mmol/L (ref 20–31)
CREATININE: 0.78 mg/dL (ref 0.50–1.05)
Calcium: 9.4 mg/dL (ref 8.6–10.4)
Glucose, Bld: 94 mg/dL (ref 65–99)
Potassium: 3.7 mmol/L (ref 3.5–5.3)
Sodium: 139 mmol/L (ref 135–146)
TOTAL PROTEIN: 7.3 g/dL (ref 6.1–8.1)

## 2016-03-16 LAB — POCT URINALYSIS DIP (MANUAL ENTRY)
BILIRUBIN UA: NEGATIVE
BILIRUBIN UA: NEGATIVE
GLUCOSE UA: NEGATIVE
Leukocytes, UA: NEGATIVE
NITRITE UA: NEGATIVE
PH UA: 6.5
Protein Ur, POC: NEGATIVE
Urobilinogen, UA: 0.2

## 2016-03-16 LAB — HEMOCCULT GUIAC POC 1CARD (OFFICE): FECAL OCCULT BLD: NEGATIVE

## 2016-03-16 MED ORDER — IOPAMIDOL (ISOVUE-300) INJECTION 61%
100.0000 mL | Freq: Once | INTRAVENOUS | Status: AC | PRN
  Administered 2016-03-16: 100 mL via INTRAVENOUS

## 2016-03-16 MED ORDER — OXYCODONE-ACETAMINOPHEN 5-325 MG PO TABS: 1.0000 | ORAL_TABLET | Freq: Three times a day (TID) | ORAL | Status: DC | PRN

## 2016-03-16 NOTE — Progress Notes (Addendum)
Subjective:    Patient ID: Abigail [name], female    DOB: 07-Sep-1961, 55 y.o.   MRN: [id number] By signing my name below, I, Abigail [name], attest that this documentation has been prepared under the direction and in the presence of Delman Cheadle, MD. Electronically Signed: Judithe [name], ER Scribe. 03/16/2016. 2:01 PM.  Chief Complaint  Patient presents with  . Abdominal Pain    lower left   HPI HPI Comments: Abigail [name] is a 55 y.o. female who presents to [facility] complaining of lower left sided abdominal pain. She states her sx started with waking up multiple times throughout the night two days ago to urinate, which is extremely abnormal for her. This happened again last night, with associated pain starting last night. She has never had these sx before. The pain resolved last night and then has recurred several times throughout the day and is a sudden, severe, stabbing pain. The pain started in her upper abdoment/flank area, and has moved down into her groin over the course of the day. She took 600mg  of motrin today without relief. Her urine has looked normal recently. She denies changes in urine smell, dysuria, or increased volume. She endorses possible urinary hesitancy. Her BM have been slightly more frequent, but she has increased the amount of miralax she is taking. She started taking Qsymia four days ago. She denies vaginal discharge or abnormal bleeding. She denies fever or chills. When her abd is palpated, the pain is so severe it causes nausea.  She has an extensive hx of sjogrens and Lupus. She also suffers from HTN, ADD, glucose intolerance, chronic migraines, pancreatitis, and fibromyalgia. She is seeing a bariatric physician in [place] for non-surgical weight loss mangement. She has a past surgical hx of double pancreatic sphincterectomy, hysterectomy, cholecystectomy, appendectomy, and sigmoidectomy in 2013. She still has her ovaries.    Past Medical History  Diagnosis Date  .  Lupus ([place])   . Migraine   . Complication of anesthesia     Sjogrens Syndrome  . Sjogren's syndrome ([place])   . 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 Diverticulitis   . Gastritis   . Duodenitis   . Pancreatitis   . Fibromyalgia   . 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 Asthma   . Melanoma ([place]) 2008    Back; [place].  . Depression   . GERD (gastroesophageal reflux disease)   . Hypertension   . Urticaria   . Obstructive sleep apnea 12/25/2012  . SLE (systemic lupus erythematosus) ([facility])    Allergies  Allergen Reactions  . Meperidine Hives  . Shrimp [Shellfish Allergy] Shortness Of Breath  . Meperidine Hcl Other (See Comments)    tachycardia  . Promethazine Hcl Other (See Comments)    "uncontrolled vomiting"  . Doxycycline Hives  . Latex Other (See Comments)    Breathing problems.  . Sulfonamide Derivatives Hives  . Tetracyclines & Related Rash   Current Outpatient Prescriptions on File Prior to Visit  Medication Sig Dispense Refill  . albuterol (PROAIR HFA) 108 (90 BASE) MCG/ACT inhaler Use 2 puffs every 6 hours as needed for shortness of breath 8.5 g 0  . amphetamine-dextroamphetamine (ADDERALL) 10 MG tablet Take 1 tablet (10 mg total) by mouth 2 (two) times daily with a meal. concentration 60 tablet 0  . botulinum toxin Type A (BOTOX) 100 units SOLR injection To be administered by provider in the office into facial and neck muscles every  three months. 2 vial 0  . fluconazole (DIFLUCAN) 200 MG tablet Take 1 tablet (200 mg total) by mouth daily as needed. 7 tablet 2  . fluorometholone (FML) 0.1 % ophthalmic suspension   1  . fluticasone (FLONASE) 50 MCG/ACT nasal spray Place 2 sprays into both nostrils daily. 16 g 6  . hydroxychloroquine (PLAQUENIL) 200 MG tablet Take 200 mg by mouth 2 (two) times daily.    Marland Kitchen Lifitegrast (XIIDRA) 5 % SOLN Apply 1 drop to eye daily.    Marland Kitchen LORazepam (ATIVAN) 1 MG tablet TAKE 1 TABLET TWICE DAILY AS NEEDED FOR ANXIETY. 60 tablet 2  .  losartan (COZAAR) 100 MG tablet TAKE 1 TABLET IN THE MORNING. 90 tablet 0  . losartan (COZAAR) 50 MG tablet Take 50 mg by mouth daily. Reported on 02/13/2016    . metoCLOPramide (REGLAN) 10 MG tablet Take 1 tablet (10 mg total) by mouth 3 (three) times daily as needed. 30 tablet 2  . omeprazole (PRILOSEC) 40 MG capsule Take 40 mg by mouth daily.    . potassium chloride (KLOR-CON) 20 MEQ packet Take 20 mEq by mouth daily as needed. Take when potassium is low    . verapamil (CALAN-SR) 120 MG CR tablet TAKE ONE TABLET AT BEDTIME. 90 tablet 0  . zolpidem (AMBIEN) 10 MG tablet TAKE 1 TABLET AT BEDTIME AS NEEDED FOR SLEEP. 90 tablet 0  . butalbital-acetaminophen-caffeine (FIORICET, ESGIC) 50-325-40 MG tablet Take 1 tablet by mouth every 6 (six) hours as needed for headache. (Patient not taking: Reported on 03/16/2016) 10 tablet 2  . levofloxacin (LEVAQUIN) 750 MG tablet Take 1 tablet (750 mg total) by mouth daily. (Patient not taking: Reported on 03/16/2016) 7 tablet 0  . [DISCONTINUED] montelukast (SINGULAIR) 10 MG tablet Take 1 tablet (10 mg total) by mouth at bedtime. (Patient not taking: Reported on 07/15/2015) 30 tablet 3   No current facility-administered medications on file prior to visit.    Review of Systems  Constitutional: Positive for appetite change and fatigue. Negative for fever, chills, activity change and unexpected weight change.  Gastrointestinal: Positive for nausea, abdominal pain, diarrhea and constipation. Negative for vomiting, blood in stool, abdominal distention and anal bleeding.  Genitourinary: Positive for frequency, flank pain, decreased urine volume and pelvic pain. Negative for dysuria, urgency, enuresis, difficulty urinating and genital sores.       Urinary hesitency  Musculoskeletal: Positive for back pain. Negative for joint swelling and gait problem.  Skin: Negative for color change and rash.  Allergic/Immunologic: Positive for immunocompromised state.    Psychiatric/Behavioral: Positive for sleep disturbance.      Objective:  BP 128/72 mmHg  Pulse 84  Temp(Src) 97.8 F (36.6 C)  Resp 16  Ht 5\' 5"  (1.651 m)  Wt 212 lb 12.8 oz (96.525 kg)  BMI 35.41 kg/m2  SpO2 96%  Physical Exam  Constitutional: She is oriented to person, place, and time. She appears well-developed and well-nourished. No distress.  HENT:  Head: Normocephalic and atraumatic.  Eyes: Pupils are equal, round, and reactive to light.  Neck: Neck supple.  Cardiovascular: Normal rate.   Pulmonary/Chest: Effort normal. No respiratory distress.  Abdominal: Soft. Normal appearance and bowel sounds are normal. She exhibits no distension. There is no hepatosplenomegaly. There is tenderness in the left upper quadrant and left lower quadrant. There is guarding and CVA tenderness. There is no rigidity and no rebound. No hernia.  Positive left CVA tenderness. TTP suprapubic, LLQ more than upper. Palpate the left kidney/sleen it  causes pain centrally.   Genitourinary: Rectum normal and vagina normal. Rectal exam shows no fissure, no mass, no tenderness and anal tone normal. Guaiac negative stool. There is no rash, tenderness or lesion on the right labia. There is no rash, tenderness or lesion on the left labia. Right adnexum displays no mass, no tenderness and no fullness. Left adnexum displays no mass, no tenderness and no fullness. No erythema in the vagina. No vaginal discharge found.  Cervix and uterus are surgically absent  Musculoskeletal: Normal range of motion.  Neurological: She is alert and oriented to person, place, and time. Coordination normal.  Skin: Skin is warm and dry. She is not diaphoretic.  Psychiatric: She has a normal mood and affect. Her behavior is normal.  Nursing note and vitals reviewed.     Results for orders placed or performed in visit on 03/16/16  POCT Microscopic Urinalysis ([facility])  Result Value Ref Range   WBC,UR,HPF,POC None None WBC/hpf    RBC,UR,HPF,POC None None RBC/hpf   Bacteria None None, Too numerous to count   Mucus Absent Absent   Epithelial Cells, UR Per Microscopy None None, Too numerous to count cells/hpf  POCT urinalysis dipstick  Result Value Ref Range   Color, UA yellow yellow   Clarity, UA clear clear   Glucose, UA negative negative   Bilirubin, UA negative negative   Ketones, POC UA negative negative   Spec Grav, UA <=1.005    Blood, UA trace-intact (A) negative   pH, UA 6.5    Protein Ur, POC negative negative   Urobilinogen, UA 0.2    Nitrite, UA Negative Negative   Leukocytes, UA Negative Negative  POCT CBC  Result Value Ref Range   WBC 4.2 (A) 4.6 - 10.2 K/uL   Lymph, poc 1.4 0.6 - 3.4   POC LYMPH PERCENT 32.9 10 - 50 %L   MID (cbc) 0.4 0 - 0.9   POC MID % 8.6 0 - 12 %M   POC Granulocyte 2.5 2 - 6.9   Granulocyte percent 58.5 37 - 80 %G   RBC 4.87 4.04 - 5.48 M/uL   Hemoglobin 13.9 12.2 - 16.2 g/dL   HCT, POC 40.1 37.7 - 47.9 %   MCV 82.2 80 - 97 fL   MCH, POC 28.6 27 - 31.2 pg   MCHC 34.7 31.8 - 35.4 g/dL   RDW, POC 13.8 %   Platelet Count, POC 195 142 - 424 K/uL   MPV 8.1 0 - 99.8 fL  POCT occult blood stool  Result Value Ref Range   Fecal Occult Blood, POC Negative Negative   Card #1 Date 03/16/2016    Card #2 Fecal Occult Blod, POC     Card #2 Date     Card #3 Fecal Occult Blood, POC     Card #3 Date     Dg Abd 1 View  03/16/2016  CLINICAL DATA:  Left lower quadrant pain. EXAM: ABDOMEN - 1 VIEW COMPARISON:  May 03, 2015 FINDINGS: Cholecystectomy clips. No evidence of bowel obstruction. The abdomen is normal in appearance. IMPRESSION: No cause for pain identified. Electronically Signed   By: Dorise Bullion III M.D   On: 03/16/2016 15:09    Assessment & Plan:   1. Urinary frequency   2. Acute abdominal pain in left lower quadrant   DDx includes kidney stone though only trace blood on UA though very dilute and no stone seen on KUB.  Pt has had NUMEROUS abdominal/pelvic surgeries  including  a sigmoidectomy so I am concerned she could be developing obstruction from adhesions.  She could have some spleen or kidney pathology or colon complication due to prior surgeries.  Pt has a h/o pancreatitis sev times and although her pain is in a different location though certainly her amazing number of abd surgeries makes it difficult to interpret her pain.  Orders Placed This Encounter  Procedures  . DG Abd 1 View    Standing Status: Future     Number of Occurrences: 1     Standing Expiration Date: 03/16/2017    Order Specific Question:  Reason for Exam (SYMPTOM  OR DIAGNOSIS REQUIRED)    Answer:  acute left lower quandrant pain wiht urinary changes, check for kidney stone    Order Specific Question:  Is the patient pregnant?    Answer:  No    Order Specific Question:  Preferred imaging location?    Answer:  External  . CT Abdomen Pelvis W Wo Contrast    This exam should ONLY be ordered for initial diagnosis or follow up of known pancreatic/liver/renal/bladder masses.    Standing Status: Future     Number of Occurrences:      Standing Expiration Date: 06/16/2017    Order Specific Question:  If indicated for the ordered procedure, I authorize the administration of contrast media per Radiology protocol    Answer:  Yes    Order Specific Question:  Reason for Exam (SYMPTOM  OR DIAGNOSIS REQUIRED)    Answer:  acute colicy left lower quadrant pain - ddx kidney stone, diverticulitis, adhesions, partial obstruction,    Order Specific Question:  Is the patient pregnant?    Answer:  No    Order Specific Question:  Preferred imaging location?    Answer:  [facility]  . Comprehensive metabolic panel  . Lipase  . POCT Microscopic Urinalysis ([facility])  . POCT urinalysis dipstick  . POCT CBC  . POCT occult blood stool  . POCT urinalysis dipstick    Standing Status: Future     Number of Occurrences:      Standing Expiration Date: 03/16/2017  . POCT Microscopic Urinalysis ([facility])     Standing Status: Future     Number of Occurrences:      Standing Expiration Date: 03/16/2017    Meds ordered this encounter  Medications  . DISCONTD: UNABLE TO FIND    Sig: qysimia  . Phentermine-Topiramate (QSYMIA) 3.75-23 MG CP24    Sig: Take by mouth.  . oxyCODONE-acetaminophen (ROXICET) 5-325 MG tablet    Sig: Take 1 tablet by mouth every 8 (eight) hours as needed for severe pain.    Dispense:  20 tablet    Refill:  0    I personally performed the services described in this documentation, which was scribed in my presence. The recorded information has been reviewed and considered, and addended by me as needed.  Delman Cheadle, MD MPH

## 2016-03-16 NOTE — Addendum Note (Signed)
Addended by: Elie Confer on: 03/16/2016 04:35 PM   Modules accepted: Orders

## 2016-03-16 NOTE — Patient Instructions (Signed)
Go to [facility] for outpatient CT. Register at the outpatient entrance.   [facility]  [place] Ph# [phone]

## 2016-03-17 ENCOUNTER — Encounter: Payer: Self-pay | Admitting: Family Medicine

## 2016-03-19 DIAGNOSIS — R7303 Prediabetes: Secondary | ICD-10-CM | POA: Insufficient documentation

## 2016-04-05 ENCOUNTER — Telehealth: Payer: Self-pay | Admitting: Gastroenterology

## 2016-04-05 NOTE — Telephone Encounter (Signed)
Spoke with patient and she states her lipase was elevated at another MD visit. She reports some abdominal pain but she states it is nothing urgent. Offered OV with extender next week but she refused. States she will have "Christy to take care of it."

## 2016-04-20 ENCOUNTER — Encounter: Payer: Self-pay | Admitting: Family Medicine

## 2016-04-22 MED ORDER — OFLOXACIN 0.3 % OP SOLN: 1.0000 [drp] | Freq: Four times a day (QID) | OPHTHALMIC | Status: DC

## 2016-05-01 ENCOUNTER — Telehealth: Payer: Self-pay | Admitting: Family Medicine

## 2016-05-01 NOTE — Telephone Encounter (Signed)
Ebany from [facility] called and has more questions in regards to patient getting Botox Please call Ebany at [phone]

## 2016-05-02 ENCOUNTER — Encounter: Payer: Self-pay | Admitting: Physical Medicine & Rehabilitation

## 2016-05-02 ENCOUNTER — Ambulatory Visit (HOSPITAL_BASED_OUTPATIENT_CLINIC_OR_DEPARTMENT_OTHER): Payer: Medicare Other | Admitting: Physical Medicine & Rehabilitation

## 2016-05-02 ENCOUNTER — Telehealth: Payer: Self-pay | Admitting: Physical Medicine & Rehabilitation

## 2016-05-02 ENCOUNTER — Encounter: Payer: Medicare Other | Attending: Physical Medicine & Rehabilitation

## 2016-05-02 VITALS — BP 111/64 | HR 84 | Resp 17

## 2016-05-02 DIAGNOSIS — G43709 Chronic migraine without aura, not intractable, without status migrainosus: Secondary | ICD-10-CM | POA: Diagnosis not present

## 2016-05-02 NOTE — Telephone Encounter (Signed)
Reah with [employer] has approved Botox for 1 year starting 04/30/16 through 04/30/17.  Any questions please call her at [phone].

## 2016-05-02 NOTE — Progress Notes (Signed)
Botox injection for chronic migraine prophylaxis.  Indication: History of migraines with greater than 15 headaches per month despite trials of oral medications.Has had prior Botox injections, both cosmetic as well as for migraine. Last injection in February 2017  Informed consent was obtained after discussing risks and benefits of the procedure with the patient this included bleeding bruising infection as well as facial drooping, eyelid lag, systemic effects of Botox. Patient elects to proceed and has given written consent.  Patient placed in a seated position Dilution 50 units per cc  Muscles injected with dose  Procerus 5 units Corrugator 5 units bilateral Frontalis 5 units 2 injection sites on the right and 2 injection sites on the left Temporalis 5 units in 4 injection sites on the right and 4 injection sites on the left Occipitalis 5 units into 3 injections sites on the right and 3 injection sites on the left  Cervical paraspinals 5 units into 2 injection sites on the right and 2 injection sites on the left trapezius 5 units into 3 injection sites on the right and 3 injection sites on the left  Patient tolerated procedure well. Post procedure instructions given. 155 units injected, remainder wasted Appointment for repeat injection in 3 months Medical management as per neurology, Dr. Lavell Anchors

## 2016-05-03 ENCOUNTER — Encounter: Payer: Self-pay | Admitting: Family Medicine

## 2016-05-03 DIAGNOSIS — R1013 Epigastric pain: Secondary | ICD-10-CM

## 2016-05-03 DIAGNOSIS — I10 Essential (primary) hypertension: Secondary | ICD-10-CM

## 2016-05-03 DIAGNOSIS — G8929 Other chronic pain: Secondary | ICD-10-CM

## 2016-05-07 ENCOUNTER — Ambulatory Visit: Payer: Medicare Other | Admitting: Family Medicine

## 2016-05-07 ENCOUNTER — Other Ambulatory Visit: Payer: Self-pay | Admitting: Family Medicine

## 2016-05-09 NOTE — Telephone Encounter (Signed)
I am going to defer refilling this med to pt's PCP Dr. Tamala Julian - looks like she was on it for 20 yrs at night for sleep - I saw her for sinusitis and refilled it as there had been some confusion with scripts at the pharmacy, but will try to consolidate pt's controlled meds to 1 provider.

## 2016-05-09 NOTE — Telephone Encounter (Signed)
Please call in or fax refill of Lorazepam as approved.

## 2016-05-10 ENCOUNTER — Other Ambulatory Visit: Payer: Self-pay | Admitting: Family Medicine

## 2016-05-10 ENCOUNTER — Encounter: Payer: Self-pay | Admitting: Family Medicine

## 2016-05-10 NOTE — Telephone Encounter (Signed)
Called in and asked pharm to notify pt when ready. 

## 2016-05-16 ENCOUNTER — Other Ambulatory Visit (INDEPENDENT_AMBULATORY_CARE_PROVIDER_SITE_OTHER): Payer: Medicare Other | Admitting: Family Medicine

## 2016-05-16 DIAGNOSIS — H02401 Unspecified ptosis of right eyelid: Secondary | ICD-10-CM

## 2016-05-16 DIAGNOSIS — H9201 Otalgia, right ear: Secondary | ICD-10-CM

## 2016-05-16 DIAGNOSIS — I10 Essential (primary) hypertension: Secondary | ICD-10-CM

## 2016-05-16 DIAGNOSIS — G8929 Other chronic pain: Secondary | ICD-10-CM

## 2016-05-16 DIAGNOSIS — R1013 Epigastric pain: Secondary | ICD-10-CM

## 2016-05-16 DIAGNOSIS — R350 Frequency of micturition: Secondary | ICD-10-CM

## 2016-05-16 DIAGNOSIS — R1032 Left lower quadrant pain: Secondary | ICD-10-CM

## 2016-05-16 LAB — COMPREHENSIVE METABOLIC PANEL
ALT: 28 U/L (ref 6–29)
AST: 30 U/L (ref 10–35)
Albumin: 4.3 g/dL (ref 3.6–5.1)
Alkaline Phosphatase: 82 U/L (ref 33–130)
BILIRUBIN TOTAL: 0.5 mg/dL (ref 0.2–1.2)
BUN: 20 mg/dL (ref 7–25)
CALCIUM: 9 mg/dL (ref 8.6–10.4)
CO2: 22 mmol/L (ref 20–31)
Chloride: 109 mmol/L (ref 98–110)
Creat: 0.81 mg/dL (ref 0.50–1.05)
GLUCOSE: 97 mg/dL (ref 65–99)
Potassium: 3.5 mmol/L (ref 3.5–5.3)
SODIUM: 140 mmol/L (ref 135–146)
Total Protein: 6.9 g/dL (ref 6.1–8.1)

## 2016-05-16 LAB — LIPASE: Lipase: 85 U/L — ABNORMAL HIGH (ref 7–60)

## 2016-05-18 ENCOUNTER — Ambulatory Visit (INDEPENDENT_AMBULATORY_CARE_PROVIDER_SITE_OTHER): Payer: Medicare Other | Admitting: Family Medicine

## 2016-05-18 VITALS — BP 120/80 | HR 99 | Temp 97.6°F | Resp 18 | Ht 65.0 in | Wt 196.1 lb

## 2016-05-18 DIAGNOSIS — R1084 Generalized abdominal pain: Secondary | ICD-10-CM | POA: Diagnosis not present

## 2016-05-18 DIAGNOSIS — E669 Obesity, unspecified: Secondary | ICD-10-CM | POA: Diagnosis not present

## 2016-05-18 DIAGNOSIS — I10 Essential (primary) hypertension: Secondary | ICD-10-CM

## 2016-05-18 DIAGNOSIS — M3500 Sicca syndrome, unspecified: Secondary | ICD-10-CM | POA: Diagnosis not present

## 2016-05-18 DIAGNOSIS — S39012A Strain of muscle, fascia and tendon of lower back, initial encounter: Secondary | ICD-10-CM | POA: Diagnosis not present

## 2016-05-18 DIAGNOSIS — R748 Abnormal levels of other serum enzymes: Secondary | ICD-10-CM

## 2016-05-18 LAB — POCT URINALYSIS DIP (MANUAL ENTRY)
BILIRUBIN UA: NEGATIVE
BILIRUBIN UA: NEGATIVE
Blood, UA: NEGATIVE
Glucose, UA: NEGATIVE
LEUKOCYTES UA: NEGATIVE
NITRITE UA: NEGATIVE
PROTEIN UA: NEGATIVE
Spec Grav, UA: <=1.005
Urobilinogen, UA: 0.2
pH, UA: 6

## 2016-05-18 LAB — POC MICROSCOPIC URINALYSIS (UMFC): MUCUS RE: ABSENT

## 2016-05-18 MED ORDER — LIDOCAINE 5 % EX PTCH: 1.0000 | MEDICATED_PATCH | CUTANEOUS | Status: DC

## 2016-05-18 NOTE — Progress Notes (Signed)
Subjective:    Patient ID: Abigail [name], female    DOB: 12/17/1960, 55 y.o.   MRN: [id number]  05/18/2016  Follow-up (Recheck on BP meds & labs)   HPI This 55 y.o. female presents for follow-up of the following:    Obesity: weight down 23 pounds from 11/2015.  Food diary.  Doing everything told to do. Fit bit interacts with food diary.   Husband has lost seven pounds; Abigail [name] is going to undergo neuropsych team to maximize function.  Recommended follow-up two years later; it has been five years.  Eating 1400 kcal per day.  Not taking Adderall while on Qsym.  Having numbness in hands/feet which tolerable.  Some brainfog with Topamax component; constipation is side effect; Miralax is helpful; when Miralax is not working, taking refrigerated Magnesium Citrate.  First goal is to say overweight and not obese.  Abdominal pain:  Present two months ago; pain has resolved; lipase elevated at 109 at visit two months ago; now pain has resolved; lipase 85 currently.  Mild constipation will cause abdominal pain.  Scar tissue; frequent abdominal pain.  Medication may be causing constipation.  No nausea; no vomiting; with previous weight loss, lipase goes up when tries to lose weight. With rapid weight loss, gallstone formation; has had gallbladder resected; can still have thicker secretions?  S/p CT abd/pelvis 03/2016.  Has appointment with GI next week in nine days.    Lower back pain: B; feels due to muscular strain due to walking.  Not bone pain.  +Spasms.  Onset a few weeks ago.  No radiation into legs.  No n/t/w.  Stretches performed.  Used lidocaine patch yesterday for driving.  Walking minimal; was 3-4 miles per day.     Review of Systems  Constitutional: Negative for fever, chills, diaphoresis and fatigue.  Eyes: Negative for visual disturbance.  Respiratory: Negative for cough and shortness of breath.   Cardiovascular: Negative for chest pain, palpitations and leg swelling.  Gastrointestinal:  Negative for nausea, vomiting, abdominal pain, diarrhea and constipation.  Endocrine: Negative for cold intolerance, heat intolerance, polydipsia, polyphagia and polyuria.  Musculoskeletal: Positive for back pain and myalgias.  Neurological: Negative for dizziness, tremors, seizures, syncope, facial asymmetry, speech difficulty, weakness, light-headedness, numbness and headaches.    Past Medical History:  Diagnosis Date  . Asthma   . Complication of anesthesia    Sjogrens Syndrome  . Depression   . Diverticulitis   . Duodenitis   . Fibromyalgia   . Gastritis   . GERD (gastroesophageal reflux disease)   . Hypertension   . Lupus ([place])   . Melanoma ([place]) 2008   Back; [place].  . Migraine   . Obstructive sleep apnea 12/25/2012  . Pancreatitis   . 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 Sjogren's syndrome ([place])   . SLE (systemic lupus erythematosus) ([place])   . Urticaria   . 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 ABDOMINAL HYSTERECTOMY     Partial; Ovaries intact.  DUB/fibroids.  . ABDOMINOPLASTY    . ANTERIOR CERVICAL DECOMP/DISCECTOMY FUSION  10/29/2011   Procedure: ANTERIOR CERVICAL DECOMPRESSION/DISCECTOMY FUSION 2 LEVELS;  Surgeon: Floyce Stakes;  Location: [facility];  Service: Neurosurgery;  Laterality: N/A;  Cervical five-six,Cervical six-seven nterior cervical decompression/diskectomy, fusion, plate  . APPENDECTOMY    . BACK SURGERY    . CERVICAL FUSION    . CHOLECYSTECTOMY    . COLONOSCOPY  11/11/2008  .  COLONOSCOPY N/A 09/20/2013   Procedure: COLONOSCOPY;  Surgeon: Lafayette Dragon, MD;  Location: [facility];  Service: Endoscopy;  Laterality: N/A;  . LEFT HEART CATHETERIZATION WITH CORONARY ANGIOGRAM N/A 06/15/2014   Procedure: LEFT HEART CATHETERIZATION WITH CORONARY ANGIOGRAM;  Surgeon: Peter M Martinique, MD;  Location[facility] LAB;  Service: Cardiovascular;  Laterality: N/A;  . LIVER BIOPSY    . MELANOMA  EXCISION    . PANCREAS SURGERY     sphincterotomy  . REDUCTION MAMMAPLASTY Bilateral   . SIGMOIDECTOMY  03/2012   done at [facility]  . Sleep Study  10/11/2012   +severe OSA.  [facility].  . TMJ ARTHROPLASTY  1970  . ULTRASOUND GUIDANCE FOR VASCULAR ACCESS  06/15/2014   Procedure: ULTRASOUND GUIDANCE FOR VASCULAR ACCESS;  Surgeon: Peter M Martinique, MD;  Location: [facility] CATH LAB;  Service: Cardiovascular;;   Allergies  Allergen Reactions  . Meperidine Hives  . Shrimp [Shellfish Allergy] Shortness Of Breath  . Meperidine Hcl Other (See Comments)    tachycardia  . Promethazine Hcl Other (See Comments)    "uncontrolled vomiting"  . Doxycycline Hives  . Latex Other (See Comments)    Breathing problems.  . Sulfonamide Derivatives Hives  . Tetracyclines & Related Rash    Social History   Social History  . Marital status: Married    Spouse name: Abigail [name]  . Number of children: 2  . Years of education: Bachelors   Occupational History  .  [employer]   Social History Main Topics  . Smoking status: Never Smoker  . Smokeless tobacco: Never Used  . Alcohol use Yes     Comment: 01/20/12 once/month  . Drug use: No  . Sexual activity: Yes    Partners: Male   Other Topics Concern  . Not on file   Social History Narrative   Marital status: married x 28 years to [place]; happily married; no abuse      Children: 2 children (51 yo son, 45 yo daughter); no grandchildren      Lives with: husband, daughter      Employment:  [employer] 2009-2013.  Resigned 08/2012.  Bachelor degrees in communication.     Patient works as a Surveyor, minerals.   Patient drinks 2 caffeine beverages daily.   Patient is left -handed.         Tobacco:  None      Alcohol:  Once weekly; wine      Drugs: none      Exercise:  none               Family History  Problem Relation Age of Onset  . Colon cancer Paternal Uncle   . Colon polyps Sister   . Depression Sister     suicide  . Colon polyps Brother   . Colon polyps Mother   .  Dementia Mother   . Heart disease Father 53    CHF  . Macular degeneration Father        Objective:    BP 120/80   Pulse 99   Temp 97.6 F (36.4 C) (Oral)   Resp 18   Ht 5\' 5"  (1.651 m)   Wt 196 lb 2 oz (89 kg)   SpO2 97%   BMI 32.64 kg/m  Physical Exam  Constitutional: She is oriented to person, place, and time. She appears well-developed and well-nourished. No distress.  HENT:  Head: Normocephalic and atraumatic.  Right Ear: External ear normal.  Left Ear: External ear normal.  Nose: Nose normal.  Mouth/Throat: Oropharynx is clear and moist.  Eyes: Conjunctivae and EOM are normal. Pupils are equal, round, and reactive to light.  Neck: Normal range of motion. Neck supple. Carotid bruit is not present. No thyromegaly present.  Cardiovascular: Normal rate, regular rhythm, normal heart sounds and intact distal pulses.  Exam reveals no gallop and no friction rub.   No murmur heard. Pulmonary/Chest: Effort normal and breath sounds normal. She has no wheezes. She has no rales.  Abdominal: Soft. Bowel sounds are normal. She exhibits no distension and no mass. There is no tenderness. There is no rebound and no guarding.  Musculoskeletal:       Lumbar back: She exhibits decreased range of motion, pain and spasm. She exhibits no tenderness, no bony tenderness and normal pulse.  Lymphadenopathy:    She has no cervical adenopathy.  Neurological: She is alert and oriented to person, place, and time. No cranial nerve deficit.  Skin: Skin is warm and dry. No rash noted. She is not diaphoretic. No erythema. No pallor.  Psychiatric: She has a normal mood and affect. Her behavior is normal.   Results for orders placed or performed in visit on 05/18/16  POCT urinalysis dipstick  Result Value Ref Range   Color, UA straw (A) yellow   Clarity, UA clear clear   Glucose, UA negative negative   Bilirubin, UA negative negative   Ketones, POC UA negative negative   Spec Grav, UA <=1.005     Blood, UA negative negative   pH, UA 6.0    Protein Ur, POC negative negative   Urobilinogen, UA 0.2    Nitrite, UA Negative Negative   Leukocytes, UA Negative Negative  POCT Microscopic Urinalysis ([facility])  Result Value Ref Range   WBC,UR,HPF,POC None None WBC/hpf   RBC,UR,HPF,POC None None RBC/hpf   Bacteria Few (A) None, Too numerous to count   Mucus Absent Absent   Epithelial Cells, UR Per Microscopy None None, Too numerous to count cells/hpf       Assessment & Plan:   1. Essential hypertension, benign   2. Lumbar strain, initial encounter   3. Obesity   4. SJOGREN'S SYNDROME   5. Generalized abdominal pain   6. Elevated lipase    -stable at this time. -having success with weight loss with bariatric program with Qsymia. -rx for Lidoderm patches for lower back pain; recommend heat and daily home exercise program; if persists, will warrant lumbar spine films; onset with increase in exercise program. -recent abdominal pain with elevated lipase; this has occurred in the past with rapid weight loss for patient; etiology unclear.     Orders Placed This Encounter  Procedures  . POCT urinalysis dipstick  . POCT Microscopic Urinalysis ([facility])   Meds ordered this encounter  Medications  . DISCONTD: lidocaine (LIDODERM) 5 %    Sig: Place 1 patch onto the skin daily. Remove & Discard patch within 12 hours or as directed by MD    Dispense:  30 patch    Refill:  0    No Follow-up on file.    Ricquel Foulk Elayne Guerin, M.D. [facility] [address] [place], [place]  [address] [phone] phone [phone] fax

## 2016-05-18 NOTE — Patient Instructions (Addendum)
1. Decrease Losartan 50mg  to 1/2 tablet daily.    IF you received an x-ray today, you will receive an invoice from [facility]. Please contact [facility] at [phone] with questions or concerns regarding your invoice.   IF you received labwork today, you will receive an invoice from [employer]. Please contact [facility] at [phone] with questions or concerns regarding your invoice.   Our billing staff will not be able to assist you with questions regarding bills from these companies.  You will be contacted with the lab results as soon as they are available. The fastest way to get your results is to activate your My Chart account. Instructions are located on the last page of this paperwork. If you have not heard from [place] regarding the results in 2 weeks, please contact this office.     Low Back Sprain With Rehab A sprain is an injury in which a ligament is torn. The ligaments of the lower back are vulnerable to sprains. However, they are strong and require great force to be injured. These ligaments are important for stabilizing the spinal column. Sprains are classified into three categories. Grade 1 sprains cause pain, but the tendon is not lengthened. Grade 2 sprains include a lengthened ligament, due to the ligament being stretched or partially ruptured. With grade 2 sprains there is still function, although the function may be decreased. Grade 3 sprains involve a complete tear of the tendon or muscle, and function is usually impaired. SYMPTOMS   Severe pain in the lower back.  Sometimes, a feeling of a "pop," "snap," or tear, at the time of injury.  Tenderness and sometimes swelling at the injury site.  Uncommonly, bruising (contusion) within 48 hours of injury.  Muscle spasms in the back. CAUSES  Low back sprains occur when a force is placed on the ligaments that is greater than they can handle. Common causes of injury  include:  Performing a stressful act while off-balance.  Repetitive stressful activities that involve movement of the lower back.  Direct hit (trauma) to the lower back. RISK INCREASES WITH:  Contact sports (football, wrestling).  Collisions (major skiing accidents).  Sports that require throwing or lifting (baseball, weightlifting).  Sports involving twisting of the spine (gymnastics, diving, tennis, golf).  Poor strength and flexibility.  Inadequate protection.  Previous back injury or surgery (especially fusion). PREVENTION  Wear properly fitted and padded protective equipment.  Warm up and stretch properly before activity.  Allow for adequate recovery between workouts.  Maintain physical fitness:  Strength, flexibility, and endurance.  Cardiovascular fitness.  Maintain a healthy body weight. PROGNOSIS  If treated properly, low back sprains usually heal with non-surgical treatment. The length of time for healing depends on the severity of the injury.  RELATED COMPLICATIONS   Recurring symptoms, resulting in a chronic problem.  Chronic inflammation and pain in the low back.  Delayed healing or resolution of symptoms, especially if activity is resumed too soon.  Prolonged impairment.  Unstable or arthritic joints of the low back. TREATMENT  Treatment first involves the use of ice and medicine, to reduce pain and inflammation. The use of strengthening and stretching exercises may help reduce pain with activity. These exercises may be performed at home or with a therapist. Severe injuries may require referral to a therapist for further evaluation and treatment, such as ultrasound. Your caregiver may advise that you wear a back brace or corset, to help reduce pain and discomfort. Often, prolonged bed rest results  in greater harm then benefit. Corticosteroid injections may be recommended. However, these should be reserved for the most serious cases. It is important to  avoid using your back when lifting objects. At night, sleep on your back on a firm mattress, with a pillow placed under your knees. If non-surgical treatment is unsuccessful, surgery may be needed.  MEDICATION   If pain medicine is needed, nonsteroidal anti-inflammatory medicines (aspirin and ibuprofen), or other minor pain relievers (acetaminophen), are often advised.  Do not take pain medicine for 7 days before surgery.  Prescription pain relievers may be given, if your caregiver thinks they are needed. Use only as directed and only as much as you need.  Ointments applied to the skin may be helpful.  Corticosteroid injections may be given by your caregiver. These injections should be reserved for the most serious cases, because they may only be given a certain number of times. HEAT AND COLD  Cold treatment (icing) should be applied for 10 to 15 minutes every 2 to 3 hours for inflammation and pain, and immediately after activity that aggravates your symptoms. Use ice packs or an ice massage.  Heat treatment may be used before performing stretching and strengthening activities prescribed by your caregiver, physical therapist, or athletic trainer. Use a heat pack or a warm water soak. SEEK MEDICAL CARE IF:   Symptoms get worse or do not improve in 2 to 4 weeks, despite treatment.  You develop numbness or weakness in either leg.  You lose bowel or bladder function.  Any of the following occur after surgery: fever, increased pain, swelling, redness, drainage of fluids, or bleeding in the affected area.  New, unexplained symptoms develop. (Drugs used in treatment may produce side effects.) EXERCISES  RANGE OF MOTION (ROM) AND STRETCHING EXERCISES - Low Back Sprain Most people with lower back pain will find that their symptoms get worse with excessive bending forward (flexion) or arching at the lower back (extension). The exercises that will help resolve your symptoms will focus on the  opposite motion.  Your physician, physical therapist or athletic trainer will help you determine which exercises will be most helpful to resolve your lower back pain. Do not complete any exercises without first consulting with your caregiver. Discontinue any exercises which make your symptoms worse, until you speak to your caregiver. If you have pain, numbness or tingling which travels down into your buttocks, leg or foot, the goal of the therapy is for these symptoms to move closer to your back and eventually resolve. Sometimes, these leg symptoms will get better, but your lower back pain may worsen. This is often an indication of progress in your rehabilitation. Be very alert to any changes in your symptoms and the activities in which you participated in the 24 hours prior to the change. Sharing this information with your caregiver will allow him or her to most efficiently treat your condition. These exercises may help you when beginning to rehabilitate your injury. Your symptoms may resolve with or without further involvement from your physician, physical therapist or athletic trainer. While completing these exercises, remember:   Restoring tissue flexibility helps normal motion to return to the joints. This allows healthier, less painful movement and activity.  An effective stretch should be held for at least 30 seconds.  A stretch should never be painful. You should only feel a gentle lengthening or release in the stretched tissue. FLEXION RANGE OF MOTION AND STRETCHING EXERCISES: STRETCH - Flexion, Single Knee to Chest  Lie on a firm bed or floor with both legs extended in front of you.  Keeping one leg in contact with the floor, bring your opposite knee to your chest. Hold your leg in place by either grabbing behind your thigh or at your knee.  Pull until you feel a gentle stretch in your low back. Hold __________ seconds.  Slowly release your grasp and repeat the exercise with the  opposite side. Repeat __________ times. Complete this exercise __________ times per day.  STRETCH - Flexion, Double Knee to Chest  Lie on a firm bed or floor with both legs extended in front of you.  Keeping one leg in contact with the floor, bring your opposite knee to your chest.  Tense your stomach muscles to support your back and then lift your other knee to your chest. Hold your legs in place by either grabbing behind your thighs or at your knees.  Pull both knees toward your chest until you feel a gentle stretch in your low back. Hold __________ seconds.  Tense your stomach muscles and slowly return one leg at a time to the floor. Repeat __________ times. Complete this exercise __________ times per day.  STRETCH - Low Trunk Rotation  Lie on a firm bed or floor. Keeping your legs in front of you, bend your knees so they are both pointed toward the ceiling and your feet are flat on the floor.  Extend your arms out to the side. This will stabilize your upper body by keeping your shoulders in contact with the floor.  Gently and slowly drop both knees together to one side until you feel a gentle stretch in your low back. Hold for __________ seconds.  Tense your stomach muscles to support your lower back as you bring your knees back to the starting position. Repeat the exercise to the other side. Repeat __________ times. Complete this exercise __________ times per day  EXTENSION RANGE OF MOTION AND FLEXIBILITY EXERCISES: STRETCH - Extension, Prone on Elbows   Lie on your stomach on the floor, a bed will be too soft. Place your palms about shoulder width apart and at the height of your head.  Place your elbows under your shoulders. If this is too painful, stack pillows under your chest.  Allow your body to relax so that your hips drop lower and make contact more completely with the floor.  Hold this position for __________ seconds.  Slowly return to lying flat on the floor. Repeat  __________ times. Complete this exercise __________ times per day.  RANGE OF MOTION - Extension, Prone Press Ups  Lie on your stomach on the floor, a bed will be too soft. Place your palms about shoulder width apart and at the height of your head.  Keeping your back as relaxed as possible, slowly straighten your elbows while keeping your hips on the floor. You may adjust the placement of your hands to maximize your comfort. As you gain motion, your hands will come more underneath your shoulders.  Hold this position __________ seconds.  Slowly return to lying flat on the floor. Repeat __________ times. Complete this exercise __________ times per day.  RANGE OF MOTION- Quadruped, Neutral Spine   Assume a hands and knees position on a firm surface. Keep your hands under your shoulders and your knees under your hips. You may place padding under your knees for comfort.  Drop your head and point your tailbone toward the ground below you. This will round out your lower  back like an angry cat. Hold this position for __________ seconds.  Slowly lift your head and release your tail bone so that your back sags into a large arch, like an old horse.  Hold this position for __________ seconds.  Repeat this until you feel limber in your low back.  Now, find your "sweet spot." This will be the most comfortable position somewhere between the two previous positions. This is your neutral spine. Once you have found this position, tense your stomach muscles to support your low back.  Hold this position for __________ seconds. Repeat __________ times. Complete this exercise __________ times per day.  STRENGTHENING EXERCISES - Low Back Sprain These exercises may help you when beginning to rehabilitate your injury. These exercises should be done near your "sweet spot." This is the neutral, low-back arch, somewhere between fully rounded and fully arched, that is your least painful position. When performed in this  safe range of motion, these exercises can be used for people who have either a flexion or extension based injury. These exercises may resolve your symptoms with or without further involvement from your physician, physical therapist or athletic trainer. While completing these exercises, remember:   Muscles can gain both the endurance and the strength needed for everyday activities through controlled exercises.  Complete these exercises as instructed by your physician, physical therapist or athletic trainer. Increase the resistance and repetitions only as guided.  You may experience muscle soreness or fatigue, but the pain or discomfort you are trying to eliminate should never worsen during these exercises. If this pain does worsen, stop and make certain you are following the directions exactly. If the pain is still present after adjustments, discontinue the exercise until you can discuss the trouble with your caregiver. STRENGTHENING - Deep Abdominals, Pelvic Tilt   Lie on a firm bed or floor. Keeping your legs in front of you, bend your knees so they are both pointed toward the ceiling and your feet are flat on the floor.  Tense your lower abdominal muscles to press your low back into the floor. This motion will rotate your pelvis so that your tail bone is scooping upwards rather than pointing at your feet or into the floor. With a gentle tension and even breathing, hold this position for __________ seconds. Repeat __________ times. Complete this exercise __________ times per day.  STRENGTHENING - Abdominals, Crunches   Lie on a firm bed or floor. Keeping your legs in front of you, bend your knees so they are both pointed toward the ceiling and your feet are flat on the floor. Cross your arms over your chest.  Slightly tip your chin down without bending your neck.  Tense your abdominals and slowly lift your trunk high enough to just clear your shoulder blades. Lifting higher can put excessive  stress on the lower back and does not further strengthen your abdominal muscles.  Control your return to the starting position. Repeat __________ times. Complete this exercise __________ times per day.  STRENGTHENING - Quadruped, Opposite UE/LE Lift   Assume a hands and knees position on a firm surface. Keep your hands under your shoulders and your knees under your hips. You may place padding under your knees for comfort.  Find your neutral spine and gently tense your abdominal muscles so that you can maintain this position. Your shoulders and hips should form a rectangle that is parallel with the floor and is not twisted.  Keeping your trunk steady, lift your right hand no  higher than your shoulder and then your left leg no higher than your hip. Make sure you are not holding your breath. Hold this position for __________ seconds.  Continuing to keep your abdominal muscles tense and your back steady, slowly return to your starting position. Repeat with the opposite arm and leg. Repeat __________ times. Complete this exercise __________ times per day.  STRENGTHENING - Abdominals and Quadriceps, Straight Leg Raise   Lie on a firm bed or floor with both legs extended in front of you.  Keeping one leg in contact with the floor, bend the other knee so that your foot can rest flat on the floor.  Find your neutral spine, and tense your abdominal muscles to maintain your spinal position throughout the exercise.  Slowly lift your straight leg off the floor about 6 inches for a count of 15, making sure to not hold your breath.  Still keeping your neutral spine, slowly lower your leg all the way to the floor. Repeat this exercise with each leg __________ times. Complete this exercise __________ times per day. POSTURE AND BODY MECHANICS CONSIDERATIONS - Low Back Sprain Keeping correct posture when sitting, standing or completing your activities will reduce the stress put on different body tissues,  allowing injured tissues a chance to heal and limiting painful experiences. The following are general guidelines for improved posture. Your physician or physical therapist will provide you with any instructions specific to your needs. While reading these guidelines, remember:  The exercises prescribed by your provider will help you have the flexibility and strength to maintain correct postures.  The correct posture provides the best environment for your joints to work. All of your joints have less wear and tear when properly supported by a spine with good posture. This means you will experience a healthier, less painful body.  Correct posture must be practiced with all of your activities, especially prolonged sitting and standing. Correct posture is as important when doing repetitive low-stress activities (typing) as it is when doing a single heavy-load activity (lifting). RESTING POSITIONS Consider which positions are most painful for you when choosing a resting position. If you have pain with flexion-based activities (sitting, bending, stooping, squatting), choose a position that allows you to rest in a less flexed posture. You would want to avoid curling into a fetal position on your side. If your pain worsens with extension-based activities (prolonged standing, working overhead), avoid resting in an extended position such as sleeping on your stomach. Most people will find more comfort when they rest with their spine in a more neutral position, neither too rounded nor too arched. Lying on a non-sagging bed on your side with a pillow between your knees, or on your back with a pillow under your knees will often provide some relief. Keep in mind, being in any one position for a prolonged period of time, no matter how correct your posture, can still lead to stiffness. PROPER SITTING POSTURE In order to minimize stress and discomfort on your spine, you must sit with correct posture. Sitting with good posture  should be effortless for a healthy body. Returning to good posture is a gradual process. Many people can work toward this most comfortably by using various supports until they have the flexibility and strength to maintain this posture on their own. When sitting with proper posture, your ears will fall over your shoulders and your shoulders will fall over your hips. You should use the back of the chair to support your upper back.  Your lower back will be in a neutral position, just slightly arched. You may place a small pillow or folded towel at the base of your lower back for  support.  When working at a desk, create an environment that supports good, upright posture. Without extra support, muscles tire, which leads to excessive strain on joints and other tissues. Keep these recommendations in mind: CHAIR:  A chair should be able to slide under your desk when your back makes contact with the back of the chair. This allows you to work closely.  The chair's height should allow your eyes to be level with the upper part of your monitor and your hands to be slightly lower than your elbows. BODY POSITION  Your feet should make contact with the floor. If this is not possible, use a foot rest.  Keep your ears over your shoulders. This will reduce stress on your neck and low back. INCORRECT SITTING POSTURES  If you are feeling tired and unable to assume a healthy sitting posture, do not slouch or slump. This puts excessive strain on your back tissues, causing more damage and pain. Healthier options include:  Using more support, like a lumbar pillow.  Switching tasks to something that requires you to be upright or walking.  Talking a brief walk.  Lying down to rest in a neutral-spine position. PROLONGED STANDING WHILE SLIGHTLY LEANING FORWARD  When completing a task that requires you to lean forward while standing in one place for a long time, place either foot up on a stationary 2-4 inch high object  to help maintain the best posture. When both feet are on the ground, the lower back tends to lose its slight inward curve. If this curve flattens (or becomes too large), then the back and your other joints will experience too much stress, tire more quickly, and can cause pain. CORRECT STANDING POSTURES Proper standing posture should be assumed with all daily activities, even if they only take a few moments, like when brushing your teeth. As in sitting, your ears should fall over your shoulders and your shoulders should fall over your hips. You should keep a slight tension in your abdominal muscles to brace your spine. Your tailbone should point down to the ground, not behind your body, resulting in an over-extended swayback posture.  INCORRECT STANDING POSTURES  Common incorrect standing postures include a forward head, locked knees and/or an excessive swayback. WALKING Walk with an upright posture. Your ears, shoulders and hips should all line-up. PROLONGED ACTIVITY IN A FLEXED POSITION When completing a task that requires you to bend forward at your waist or lean over a low surface, try to find a way to stabilize 3 out of 4 of your limbs. You can place a hand or elbow on your thigh or rest a knee on the surface you are reaching across. This will provide you more stability, so that your muscles do not tire as quickly. By keeping your knees relaxed, or slightly bent, you will also reduce stress across your lower back. CORRECT LIFTING TECHNIQUES DO :  Assume a wide stance. This will provide you more stability and the opportunity to get as close as possible to the object which you are lifting.  Tense your abdominals to brace your spine. Bend at the knees and hips. Keeping your back locked in a neutral-spine position, lift using your leg muscles. Lift with your legs, keeping your back straight.  Test the weight of unknown objects before attempting to lift them.  Try to keep your elbows locked down at  your sides in order get the best strength from your shoulders when carrying an object.  Always ask for help when lifting heavy or awkward objects. INCORRECT LIFTING TECHNIQUES DO NOT:   Lock your knees when lifting, even if it is a small object.  Bend and twist. Pivot at your feet or move your feet when needing to change directions.  Assume that you can safely pick up even a paperclip without proper posture.   This information is not intended to replace advice given to you by your health care provider. Make sure you discuss any questions you have with your health care provider.   Document Released: 10/28/2005 Document Revised: 11/18/2014 Document Reviewed: 02/09/2009 [employer] Interactive Patient Education [employer].

## 2016-05-27 ENCOUNTER — Encounter: Payer: Self-pay | Admitting: Gastroenterology

## 2016-05-27 ENCOUNTER — Telehealth: Payer: Self-pay

## 2016-05-27 ENCOUNTER — Ambulatory Visit (INDEPENDENT_AMBULATORY_CARE_PROVIDER_SITE_OTHER): Payer: Medicare Other | Admitting: Gastroenterology

## 2016-05-27 VITALS — BP 102/76 | HR 72 | Ht 65.0 in | Wt 193.6 lb

## 2016-05-27 DIAGNOSIS — K760 Fatty (change of) liver, not elsewhere classified: Secondary | ICD-10-CM | POA: Diagnosis not present

## 2016-05-27 DIAGNOSIS — R1013 Epigastric pain: Secondary | ICD-10-CM | POA: Diagnosis not present

## 2016-05-27 DIAGNOSIS — R748 Abnormal levels of other serum enzymes: Secondary | ICD-10-CM

## 2016-05-27 DIAGNOSIS — Z8719 Personal history of other diseases of the digestive system: Secondary | ICD-10-CM

## 2016-05-27 NOTE — Progress Notes (Signed)
HPI :  55 y/o female here for new patient visit for an elevated lipase and abdominal pain. She reports a history of SLE and Sjogren's disease.   She reports a history of recurrent acute pancreatitis which initially began several years ago for which she underwent an extensive evaluation at [place] [facility] [employer] [facility]. 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 She reports multiple episodes of pancreatitis, upwards of "20 to 30" in the past leading to prior admissions. She also thinks she has had a genetic component to her pancreatitis as her mother and sisters have also had pancreatitis. 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 She she has had multiple episodes of pain in which her pain was severe and yet her pancreatic enzymes were only mildly elevated. She endorses having had 4 ERCPs in the past, last done in 1997. She has also had prior MRCPs. She thinks her last admission for pancreatitis was in 2007 at [facility]. She is s/p cholecystectomy.  She has not yet had genetic testing for hereditary pancreatitis. She endorses 1-2 drinks of alcohol per month, she denies any history of significant alcohol use. In general, since sphincterotomy at [facility] she has had only rare episodes of pain and in general sphcinterotomy has provided her significant benefit. In general she has been doing pretty well in regards to her symptoms until this past May.   She went on Qsymia in May for weight loss (phenteramine) which has helped with weight loss. She reports when she has lost weight in the past her "lipase has been elevated".  She has lost 25 lbs since May.  She reports some discomfort in her abdomen, comes and goes, in the epigastric area since that time. She feels it every 4-5 days. She had sharp pains the first time this happened, she had a CT scan done in  May for this issue. Pain did not feel like pancreatitis at the time, she thought it felt more like diverticulitis, which it did not show. Since that time she had not had any severe episodes, she has mild pain which lasts a day. She denies NSAID use.  She has had a partial sigmoid colectomy at [facility] for diverticulitis / perforation, no recurrence since that time.   She otherwise asks about fatty liver noted on her [place]. No FH of liver disease.   Colonoscopy 2014 - diverticulosis, benign sigmoid polyp  Past Medical History  Diagnosis Date  . Lupus ([place])   . Migraine   . Complication of anesthesia     Sjogrens Syndrome  . Sjogren's syndrome ([place])   . Vasculitis ([place])     "autoimmune"  . Diverticulitis   . Gastritis   . Duodenitis   . Pancreatitis   . Fibromyalgia   . Perforation bowel ([place])     "microperforation"  . Asthma   . Melanoma ([place]) 2008    Back; [place].  . Depression   . GERD (gastroesophageal reflux disease)   . Hypertension   . Urticaria   . Obstructive sleep apnea 12/25/2012  . SLE (systemic lupus erythematosus) ([place])      Past Surgical History  Procedure Laterality Date  . Melanoma excision    . Tmj arthroplasty  1970  . Appendectomy    . Cholecystectomy    . Pancreas surgery      sphincterotomy  .  Abdominoplasty    . Liver biopsy    . Anterior cervical decomp/discectomy fusion  10/29/2011    Procedure: ANTERIOR CERVICAL DECOMPRESSION/DISCECTOMY FUSION 2 LEVELS;  Surgeon: Floyce Stakes;  Location: [place] [facility];  Service: Neurosurgery;  Laterality: N/A;  Cervical five-six,Cervical six-seven nterior cervical decompression/diskectomy, fusion, plate  . Sigmoidectomy  03/2012    done at [facility]  . Abdominal hysterectomy      Partial; Ovaries intact.  DUB/fibroids.  . Colonoscopy  11/11/2008  . Sleep study  10/11/2012    +severe OSA.  [facility].  . Colonoscopy N/A 09/20/2013    Procedure: COLONOSCOPY;  Surgeon: Lafayette Dragon, MD;  Location: [facility];   Service: Endoscopy;  Laterality: N/A;  . Reduction mammaplasty Bilateral   . Back surgery    . Left heart catheterization with coronary angiogram N/A 06/15/2014    Procedure: LEFT HEART CATHETERIZATION WITH CORONARY ANGIOGRAM;  Surgeon: Peter M Martinique, MD;  Location: [facility] CATH LAB;  Service: Cardiovascular;  Laterality: N/A;  . Ultrasound guidance for vascular access  06/15/2014    Procedure: ULTRASOUND GUIDANCE FOR VASCULAR ACCESS;  Surgeon: Peter M Martinique, MD;  Location: [facility] CATH LAB;  Service: Cardiovascular;;  . Cervical fusion     Family History  Problem Relation Age of Onset  . Colon cancer Paternal Uncle   . Colon polyps Sister   . Depression Sister     suicide  . Colon polyps Brother   . Colon polyps Mother   . Dementia Mother   . Heart disease Father 80    CHF  . Macular degeneration Father    Social History  Substance Use Topics  . Smoking status: Never Smoker   . Smokeless tobacco: Never Used  . Alcohol Use: Yes     Comment: 01/20/12 once/month   Current Outpatient Prescriptions  Medication Sig Dispense Refill  . albuterol (PROAIR HFA) 108 (90 BASE) MCG/ACT inhaler Use 2 puffs every 6 hours as needed for shortness of breath 8.5 g 0  . botulinum toxin Type A (BOTOX) 100 units SOLR injection To be administered by provider in the office into facial and neck muscles every three months. 2 vial 0  . fluorometholone (FML) 0.1 % ophthalmic suspension   1  . fluticasone (FLONASE) 50 MCG/ACT nasal spray Place 2 sprays into both nostrils daily. 16 g 6  . hydroxychloroquine (PLAQUENIL) 200 MG tablet Take 200 mg by mouth 2 (two) times daily.    Marland Kitchen Lifitegrast (XIIDRA) 5 % SOLN Apply 1 drop to eye daily.    Marland Kitchen LORazepam (ATIVAN) 1 MG tablet TAKE 1 TABLET TWICE DAILY AS NEEDED FOR ANXIETY. 60 tablet 2  . losartan (COZAAR) 50 MG tablet Take 50 mg by mouth daily. Reported on 02/13/2016    . Phentermine-Topiramate (QSYMIA) 3.75-23 MG CP24 Take by mouth.    . potassium chloride (KLOR-CON) 20 MEQ  packet Take 20 mEq by mouth daily as needed. Take when potassium is low    . verapamil (CALAN-SR) 120 MG CR tablet TAKE ONE TABLET AT BEDTIME. 90 tablet 0  . zolpidem (AMBIEN) 10 MG tablet TAKE 1 TABLET AT BEDTIME AS NEEDED FOR SLEEP. 90 tablet 0  . [DISCONTINUED] montelukast (SINGULAIR) 10 MG tablet Take 1 tablet (10 mg total) by mouth at bedtime. (Patient not taking: Reported on 07/15/2015) 30 tablet 3   No current facility-administered medications for this visit.   Allergies  Allergen Reactions  . Meperidine Hives  . Shrimp [Shellfish Allergy] Shortness Of Breath  . Meperidine  Hcl Other (See Comments)    tachycardia  . Promethazine Hcl Other (See Comments)    "uncontrolled vomiting"  . Doxycycline Hives  . Latex Other (See Comments)    Breathing problems.  . Sulfonamide Derivatives Hives  . Tetracyclines & Related Rash     Review of Systems: All systems reviewed and negative except where noted in HPI.    Lab Results  Component Value Date   LIPASE 85* 05/16/2016   Lab Results  Component Value Date   ALT 28 05/16/2016   AST 30 05/16/2016   ALKPHOS 82 05/16/2016   BILITOT 0.5 05/16/2016    Lab Results  Component Value Date   CREATININE 0.81 05/16/2016   BUN 20 05/16/2016   NA 140 05/16/2016   K 3.5 05/16/2016   CL 109 05/16/2016   CO2 22 05/16/2016    Lab Results  Component Value Date   WBC 4.2* 03/16/2016   HGB 13.9 03/16/2016   HCT 40.1 03/16/2016   MCV 82.2 03/16/2016   PLT 243 10/17/2015     Physical Exam: BP 102/76 mmHg  Pulse 72  Ht 5\' 5"  (1.651 m)  Wt 193 lb 9.6 oz (87.816 kg)  BMI 32.22 kg/m2 Constitutional: Pleasant,well-developed, female in no acute distress. HEENT: Normocephalic and atraumatic. Conjunctivae are normal. No scleral icterus. Neck supple.  Cardiovascular: Normal rate, regular rhythm.  Pulmonary/chest: Effort normal and breath sounds normal. No wheezing, rales or rhonchi. Abdominal: Soft, nondistended, mild epigastric TTP  without rebound or guarding, Bowel sounds active throughout. There are no masses palpable. No hepatomegaly. Extremities: no edema Lymphadenopathy: No cervical adenopathy noted. Neurological: Alert and oriented to person place and time. Skin: Skin is warm and dry. No rashes noted. Psychiatric: Normal mood and affect. Behavior is normal.   ASSESSMENT AND PLAN: 55 y/o female with a history of recurrent acute pancreatitis, here for assessment for recurrence of epigastric pain / elevated lipase.  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 She has had recurrence of some mild symptoms periodically as outlined above since May, when she started her weight loss supplement. I reviewed Qsymia with her and don't see any specific associations with pancreatitis.  Her lipase has been mildly elevated in recent blood draws (over the ULN but not in pancreatitis range) and her LAEs have otherwise been normal. It doesn't appear she has had a recurrence of overt pancreatitis but the patient is subjectively quite concerned about her symptoms given her extensive history with pancreatitis. I offered her an MRCP to further evaluate the pancreas and pancreatic ducts given her elevated lipase and symptoms. In the interim recommend an empiric trial of omeprazole daily. She agreed with this plan and wished to proceed. She is asking for genetic testing for hereditary pancreatitis but she will discuss with her insurance first in regards to cost. Finally, I discussed the spectrum of fatty liver disease with her, potential risks of NASH and cirrhosis. She has normal liver enzymes recently and this seems mild. She has a history of prednisone use which is a risk factor but more likely due to her BMI. Now losing weight and  should continue to do so through diet / exercise. Recommend liberal coffee intake to prevent fibrotic changes. No evidence of cirrhosis on labs / imaging. She should see [place] every year  for assessment of this issue.    Aurora Cellar, MD [facility] Pager [phone]  CC: Wardell Honour, MD

## 2016-05-27 NOTE — Telephone Encounter (Signed)
Spoke w/pt-advised PA will not be approved for Lidoderm 5% Patch. Dr. Tamala Julian advised the OTC patch@4 % would also be helpful.

## 2016-05-27 NOTE — Patient Instructions (Addendum)
If you are age 56 or older, your body mass index should be between 23-30. Your Body mass index is 32.22 kg/(m^2). If this is out of the aforementioned range listed, please consider follow up with your Primary Care Provider.  If you are age 85 or younger, your body mass index should be between 19-25. Your Body mass index is 32.22 kg/(m^2). If this is out of the aformentioned range listed, please consider follow up with your Primary Care Provider.   Please restart you Omeprazole daily.   You have been scheduled for an MRCP at [facility] on 05-29-2016. Your appointment time is 8:00am. Please arrive 15 minutes prior to your appointment time for registration purposes. Please make certain not to have anything to eat or drink 6 hours prior to your test. In addition, if you have any metal in your body, have a pacemaker or defibrillator, please be sure to let your ordering physician know. This test typically takes 45 minutes to 1 hour to complete.  Thank you for choosing [facility]  Dr Chauncey Cruel. [place]

## 2016-05-29 ENCOUNTER — Other Ambulatory Visit: Payer: Self-pay | Admitting: Gastroenterology

## 2016-05-29 ENCOUNTER — Ambulatory Visit (HOSPITAL_COMMUNITY)
Admission: RE | Admit: 2016-05-29 | Discharge: 2016-05-29 | Disposition: A | Discharge status: Home / Self Care | Payer: Medicare Other | Source: Ambulatory Visit | Attending: Gastroenterology | Admitting: Gastroenterology

## 2016-05-29 ENCOUNTER — Other Ambulatory Visit: Payer: Self-pay | Admitting: *Deleted

## 2016-05-29 DIAGNOSIS — R748 Abnormal levels of other serum enzymes: Secondary | ICD-10-CM | POA: Diagnosis not present

## 2016-05-29 DIAGNOSIS — Z8719 Personal history of other diseases of the digestive system: Secondary | ICD-10-CM

## 2016-05-29 DIAGNOSIS — K760 Fatty (change of) liver, not elsewhere classified: Secondary | ICD-10-CM

## 2016-05-29 MED ORDER — GADOBENATE DIMEGLUMINE 529 MG/ML IV SOLN
20.0000 mL | Freq: Once | INTRAVENOUS | Status: AC | PRN
  Administered 2016-05-29: 18 mL via INTRAVENOUS

## 2016-05-30 ENCOUNTER — Other Ambulatory Visit: Payer: Self-pay | Admitting: Gastroenterology

## 2016-05-31 ENCOUNTER — Other Ambulatory Visit: Payer: Self-pay | Admitting: *Deleted

## 2016-06-03 ENCOUNTER — Other Ambulatory Visit: Payer: Self-pay | Admitting: Physician Assistant

## 2016-06-03 ENCOUNTER — Telehealth: Payer: Self-pay | Admitting: Gastroenterology

## 2016-06-04 ENCOUNTER — Other Ambulatory Visit: Payer: Self-pay | Admitting: *Deleted

## 2016-06-04 DIAGNOSIS — Z8719 Personal history of other diseases of the digestive system: Secondary | ICD-10-CM

## 2016-06-04 NOTE — Telephone Encounter (Signed)
Called the company that patient gave [place] the number. They do not do this testing. Patient wants MD to find a center to do the labs and then provide the information to her insurance to see if they will pay for labs.

## 2016-06-04 NOTE — Telephone Encounter (Signed)
Okay, thanks for the update. The labs she needs are genetic testing for PRSS1 and SPINK1 gene. I am not sure if [facility], [facility], or another company offers this. We should be able to obtain this for her. We could also call genetic counseling office and see what lab they use for their genetic testing. Thanks

## 2016-06-04 NOTE — Telephone Encounter (Signed)
Spoke with [facility] gene testing department and they can do PRSS1 and SPINK1 gene testing. The lab code for this is [phone]. Patient will need order for test and can go to any service center to have this done. Left a message for patient to call me.

## 2016-06-06 NOTE — Telephone Encounter (Signed)
Spoke with patient and told her [employer] [facility] will do her testing that Dr. Havery Moros recommends. Patient requests order and [facility] locations be mailed to her since she is out of town at this time.

## 2016-07-01 ENCOUNTER — Telehealth: Payer: Self-pay | Admitting: Neurology

## 2016-07-01 NOTE — Telephone Encounter (Signed)
I'm ok with her coming back as long as we can get her botox, thanks

## 2016-07-01 NOTE — Telephone Encounter (Signed)
Spoke with patient who has requested to start receiving injections with Dr. Jaynee Eagles again, she states her bill is much higher at the other place, would it be ok to schedule her?

## 2016-07-01 NOTE — Telephone Encounter (Signed)
Dr Jaynee Eagles- patient was not happy at Dr Letta Pate office. She had to pay about 400 dollars for treatment there. She is requesting to come back to [place] for injections.

## 2016-07-01 NOTE — Telephone Encounter (Signed)
Abigail [name]- can you check into pt insurance like we previously discussed? Dr Jaynee Eagles said it was ok to bring her back here as long as its covered and she can use specialty. Thanks

## 2016-07-01 NOTE — Telephone Encounter (Signed)
I am not sure why we scheduled her with Dr. Letta Pate. It may be because of her insurance. Andee Poles can you look into it please? thanks

## 2016-07-30 ENCOUNTER — Ambulatory Visit: Payer: Medicare Other | Admitting: Family Medicine

## 2016-07-31 ENCOUNTER — Encounter: Payer: Self-pay | Admitting: Family Medicine

## 2016-07-31 ENCOUNTER — Ambulatory Visit (INDEPENDENT_AMBULATORY_CARE_PROVIDER_SITE_OTHER): Payer: Medicare Other | Admitting: Family Medicine

## 2016-07-31 VITALS — BP 120/78 | HR 96 | Temp 98.2°F | Resp 18 | Ht 65.0 in | Wt 179.4 lb

## 2016-07-31 DIAGNOSIS — G43709 Chronic migraine without aura, not intractable, without status migrainosus: Secondary | ICD-10-CM

## 2016-07-31 DIAGNOSIS — M3500 Sicca syndrome, unspecified: Secondary | ICD-10-CM | POA: Diagnosis not present

## 2016-07-31 DIAGNOSIS — E669 Obesity, unspecified: Secondary | ICD-10-CM | POA: Diagnosis not present

## 2016-07-31 DIAGNOSIS — F418 Other specified anxiety disorders: Secondary | ICD-10-CM | POA: Diagnosis not present

## 2016-07-31 DIAGNOSIS — R7302 Impaired glucose tolerance (oral): Secondary | ICD-10-CM

## 2016-07-31 MED ORDER — TOPIRAMATE 25 MG PO CPSP: 25.0000 mg | ORAL_CAPSULE | Freq: Two times a day (BID) | ORAL | 5 refills | Status: DC

## 2016-07-31 MED ORDER — CLONAZEPAM 1 MG PO TABS: 1.0000 mg | ORAL_TABLET | Freq: Two times a day (BID) | ORAL | 3 refills | Status: DC | PRN

## 2016-07-31 NOTE — Progress Notes (Signed)
Subjective:  By signing my name below, I, Moises Blood, attest that this documentation has been prepared under the direction and in the presence of Reginia Forts, MD. Electronically Signed: Moises Blood, [place]. 07/31/16 , 10:53 AM .  Patient was seen in Room 3 .   Patient ID: PEACE YAGER, female    DOB: 1961-01-23, 55 y.o.   MRN: [id number]  07/31/2016  Follow-up (Hypertension and Lupus )   HPI JAZLEEN ARENDS is a 55 y.o. female who presents to [facility] for follow up. She was seen in July. Her lipase was elevated but had intermittent chronic abdominal pain. She was seen by GI afterwards which offered MRCP and recommended omeprazole daily. She also requested genetic testing for recurrent pancreatitis but will check with insurance if there's coverage. Also discussed fatty liver disease, but losing weight now and continue through diet and exercise. She also mentioned lower back pain after starting exercise; requested lidoderm patches. If problem persists, will do xray.   Wt Readings from Last 3 Encounters:  07/31/16 179 lb 6.4 oz (81.4 kg)  05/27/16 193 lb 9.6 oz (87.8 kg)  05/18/16 196 lb 2 oz (89 kg)   She is followed by Loyal Gambler at [facility] bariatric clinic. She was on Qsymia dose 2; this was increased to dose 3 on 06/18/16. Her A1c has gone down to normal range.  When Qsymia was increased to dose 3, patient became very irritable and angry.  This side effect actually contributed to arguments with neighbors, coworkers, and spouse.  Pt essentially had no filter and sent several harsh emails to colleagues. Patient and husband quickly realized the side effect and has been weaning the medication.  Pt denies SI yet with the medication realized how someone may consider suicide.  Strong family hx of suicide; thus, patient and husband very alarmed with current emotional state.  Pt is seeing a therapist.  Patient states she's currently off both of her BP medications, and her BP is normal.  She's been walking almost daily.   She mentions noticing a rash with a white pustule center over her right wrist for a few months. She thought it was due to her fitbit but after removing it, the problem still persists. She also noticed similar rash appear over her right lower leg.  Concerned that related to SLE.   She also notes her right eye is droopy and occasionally interferes with her vision. This has been ongoing for a long time.   Patient desires consultation for surgical correction.  Review of Systems  Constitutional: Negative for chills, diaphoresis, fatigue and fever.  HENT: Negative for ear pain, postnasal drip, rhinorrhea, sinus pressure, sore throat and trouble swallowing.   Eyes: Negative for visual disturbance.  Respiratory: Negative for cough and shortness of breath.   Cardiovascular: Negative for chest pain, palpitations and leg swelling.  Gastrointestinal: Negative for abdominal pain, constipation, diarrhea, nausea and vomiting.  Endocrine: Negative for cold intolerance, heat intolerance, polydipsia, polyphagia and polyuria.  Skin: Positive for rash.  Neurological: Positive for facial asymmetry. Negative for dizziness, tremors, seizures, syncope, speech difficulty, weakness, light-headedness, numbness and headaches.  Psychiatric/Behavioral: Positive for dysphoric mood. Negative for self-injury, sleep disturbance and suicidal ideas. The patient is nervous/anxious.     Past Medical History:  Diagnosis Date  . Asthma   . Complication of anesthesia    Sjogrens Syndrome  . Depression   . Diverticulitis   . Duodenitis   . Fibromyalgia   . Gastritis   .  GERD (gastroesophageal reflux disease)   . Hypertension   . Lupus   . Melanoma ([place]) 2008   Back; [place].  . Migraine   . Obstructive sleep apnea 12/25/2012  . Pancreatitis   . 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 Sjogren's syndrome ([place])   . SLE (systemic lupus erythematosus) ([place])   . Urticaria   .  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 ABDOMINAL HYSTERECTOMY     Partial; Ovaries intact.  DUB/fibroids.  . ABDOMINOPLASTY    . ANTERIOR CERVICAL DECOMP/DISCECTOMY FUSION  10/29/2011   Procedure: ANTERIOR CERVICAL DECOMPRESSION/DISCECTOMY FUSION 2 LEVELS;  Surgeon: Floyce Stakes;  Location: [facility];  Service: Neurosurgery;  Laterality: N/A;  Cervical five-six,Cervical six-seven nterior cervical decompression/diskectomy, fusion, plate  . APPENDECTOMY    . BACK SURGERY    . CERVICAL FUSION    . CHOLECYSTECTOMY    . COLONOSCOPY  11/11/2008  . COLONOSCOPY N/A 09/20/2013   Procedure: COLONOSCOPY;  Surgeon: Lafayette Dragon, MD;  Location: [facility];  Service: Endoscopy;  Laterality: N/A;  . LEFT HEART CATHETERIZATION WITH CORONARY ANGIOGRAM N/A 06/15/2014   Procedure: LEFT HEART CATHETERIZATION WITH CORONARY ANGIOGRAM;  Surgeon: Peter M Martinique, MD;  Location: [facility] CATH LAB;  Service: Cardiovascular;  Laterality: N/A;  . LIVER BIOPSY    . MELANOMA EXCISION    . PANCREAS SURGERY     sphincterotomy  . REDUCTION MAMMAPLASTY Bilateral   . SIGMOIDECTOMY  03/2012   done at [facility]  . Sleep Study  10/11/2012   +severe OSA.  [facility].  . TMJ ARTHROPLASTY  1970  . ULTRASOUND GUIDANCE FOR VASCULAR ACCESS  06/15/2014   Procedure: ULTRASOUND GUIDANCE FOR VASCULAR ACCESS;  Surgeon: Peter M Martinique, MD;  Location: [facility] CATH LAB;  Service: Cardiovascular;;   Allergies  Allergen Reactions  . Shrimp [Shellfish Allergy] Shortness Of Breath  . Meperidine Hcl Other (See Comments)    tachycardia  . Promethazine Hcl Other (See Comments)    "uncontrolled vomiting"  . Doxycycline Hives  . Latex Other (See Comments)    Breathing problems.  . Sulfonamide Derivatives Hives  . Tetracyclines & Related Rash    Social History   Social History  . Marital status: Married    Spouse name: [name]  . Number of children: 2  . Years of education: Bachelors    Occupational History  .  [employer]   Social History Main Topics  . Smoking status: Never Smoker  . Smokeless tobacco: Never Used  . Alcohol use Yes     Comment: 01/20/12 once/month  . Drug use: No  . Sexual activity: Yes    Partners: Male   Other Topics Concern  . Not on file   Social History Narrative   Marital status: married x 28 years to [place]; happily married; no abuse      Children: 2 children (71 yo son, 80 yo daughter); no grandchildren      Lives with: husband, daughter      Employment:  [employer] 2009-2013.  Resigned 08/2012.  Bachelor degrees in communication.     Patient works as a Surveyor, minerals.   Patient drinks 2 caffeine beverages daily.   Patient is left -handed.         Tobacco:  None      Alcohol:  Once weekly; wine      Drugs: none      Exercise:  none  Family History  Problem Relation Age of Onset  . Colon polyps Sister   . Depression Sister     suicide  . Colon polyps Brother   . Colon polyps Mother   . Dementia Mother   . Heart disease Father 5    CHF  . Macular degeneration Father   . Colon cancer Paternal Uncle        Objective:    BP 120/78 (BP Location: Left Arm, Patient Position: Sitting, Cuff Size: Normal)   Pulse 96   Temp 98.2 F (36.8 C) (Oral)   Resp 18   Ht 5\' 5"  (1.651 m)   Wt 179 lb 6.4 oz (81.4 kg)   SpO2 95%   BMI 29.85 kg/m   Physical Exam  Constitutional: She is oriented to person, place, and time. She appears well-developed and well-nourished. No distress.  HENT:  Head: Normocephalic and atraumatic.  Right Ear: External ear normal.  Left Ear: External ear normal.  Nose: Nose normal.  Mouth/Throat: Oropharynx is clear and moist.  Upper eyelid prolapse laterally.  Eyes: Conjunctivae and EOM are normal. Pupils are equal, round, and reactive to light.  ptosis of upper lid of right eye lid  Neck: Normal range of motion. Neck supple. Carotid bruit is not present. No thyromegaly present.  Cardiovascular:  Normal rate, regular rhythm, normal heart sounds and intact distal pulses.  Exam reveals no gallop and no friction rub.   No murmur heard. Pulmonary/Chest: Effort normal and breath sounds normal. She has no wheezes. She has no rales.  Abdominal: Soft. Bowel sounds are normal. She exhibits no distension and no mass. There is no tenderness. There is no rebound and no guarding.  Lymphadenopathy:    She has no cervical adenopathy.  Neurological: She is alert and oriented to person, place, and time. No cranial nerve deficit. She exhibits normal muscle tone. Coordination normal.  Skin: Skin is warm and dry. No rash noted. She is not diaphoretic. No erythema. No pallor.  Right wrist: cluster of 3 pustules over the palmar surface, no surrounding erythema,  Right leg: slight papular lesion on the medial calf, measuring about 77mm in length  Psychiatric: She has a normal mood and affect. Her behavior is normal. Judgment and thought content normal.   Results for orders placed or performed in visit on 05/18/16  POCT urinalysis dipstick  Result Value Ref Range   Color, UA straw (A) yellow   Clarity, UA clear clear   Glucose, UA negative negative   Bilirubin, UA negative negative   Ketones, POC UA negative negative   Spec Grav, UA <=1.005    Blood, UA negative negative   pH, UA 6.0    Protein Ur, POC negative negative   Urobilinogen, UA 0.2    Nitrite, UA Negative Negative   Leukocytes, UA Negative Negative  POCT Microscopic Urinalysis ([facility])  Result Value Ref Range   WBC,UR,HPF,POC None None WBC/hpf   RBC,UR,HPF,POC None None RBC/hpf   Bacteria Few (A) None, Too numerous to count   Mucus Absent Absent   Epithelial Cells, UR Per Microscopy None None, Too numerous to count cells/hpf       Assessment & Plan:   1. Depression with anxiety   2. SJOGREN'S SYNDROME   3. Obesity (BMI 30-39.9)   4. Chronic migraine without aura without status migrainosus, not intractable   5. Glucose intolerance  (impaired glucose tolerance)    -acutely worsening anxiety as side effect of Qsymia high dose; weaning off of medication  with clinical improvement.  Rx for Clonazepam provided to take bid for acutely heightened anxiety; contracted safety in the office; good support from husband; continue psychotherapy. -rx for Topamax provided for migraine prevention and to also assist with weight maintenance/loss. -sugars improving with significant weight loss. -counseling provided during visit. -recommend evaluation by ophthalmology to determine effects of prolapsing upper lids on visual fields.   No orders of the defined types were placed in this encounter.  Meds ordered this encounter  Medications  . topiramate (TOPAMAX) 25 MG capsule    Sig: Take 1 capsule (25 mg total) by mouth 2 (two) times daily.    Dispense:  60 capsule    Refill:  5  . clonazePAM (KLONOPIN) 1 MG tablet    Sig: Take 1 tablet (1 mg total) by mouth 2 (two) times daily as needed for anxiety.    Dispense:  60 tablet    Refill:  3    Return in about 2 weeks (around 08/14/2016) for recheck.   I personally performed the services described in this documentation, which was scribed in my presence. The recorded information has been reviewed and considered.  Kristi Elayne Guerin, M.D. [facility] [address] [place], [place]  [address] [phone] phone [phone] fax

## 2016-07-31 NOTE — Patient Instructions (Signed)
     IF you received an x-ray today, you will receive an invoice from [facility]. Please contact  Radiology at [phone] with questions or concerns regarding your invoice.   IF you received labwork today, you will receive an invoice from [facility]. Please contact [facility] at [phone] with questions or concerns regarding your invoice.   Our billing staff will not be able to assist you with questions regarding bills from these companies.  You will be contacted with the lab results as soon as they are available. The fastest way to get your results is to activate your My Chart account. Instructions are located on the last page of this paperwork. If you have not heard from us regarding the results in 2 weeks, please contact this office.      

## 2016-08-08 ENCOUNTER — Ambulatory Visit: Payer: Medicare Other | Admitting: Physical Medicine & Rehabilitation

## 2016-08-21 ENCOUNTER — Encounter: Payer: Self-pay | Admitting: Family Medicine

## 2016-08-21 DIAGNOSIS — R748 Abnormal levels of other serum enzymes: Secondary | ICD-10-CM

## 2016-08-21 DIAGNOSIS — I10 Essential (primary) hypertension: Secondary | ICD-10-CM

## 2016-08-27 ENCOUNTER — Ambulatory Visit: Payer: Self-pay | Admitting: Family Medicine

## 2016-08-30 ENCOUNTER — Other Ambulatory Visit (INDEPENDENT_AMBULATORY_CARE_PROVIDER_SITE_OTHER): Payer: Medicare Other | Admitting: Family Medicine

## 2016-08-30 DIAGNOSIS — R748 Abnormal levels of other serum enzymes: Secondary | ICD-10-CM

## 2016-08-30 DIAGNOSIS — I10 Essential (primary) hypertension: Secondary | ICD-10-CM

## 2016-08-30 LAB — CBC WITH DIFFERENTIAL/PLATELET
BASOS ABS: 29 {cells}/uL (ref 0–200)
Basophils Relative: 1 %
EOS PCT: 2 %
Eosinophils Absolute: 58 cells/uL (ref 15–500)
HCT: 41.6 % (ref 35.0–45.0)
HEMOGLOBIN: 14.2 g/dL (ref 11.7–15.5)
LYMPHS ABS: 1160 {cells}/uL (ref 850–3900)
Lymphocytes Relative: 40 %
MCH: 28.1 pg (ref 27.0–33.0)
MCHC: 34.1 g/dL (ref 32.0–36.0)
MCV: 82.4 fL (ref 80.0–100.0)
MONOS PCT: 11 %
MPV: 11.4 fL (ref 7.5–12.5)
Monocytes Absolute: 319 cells/uL (ref 200–950)
NEUTROS ABS: 1334 {cells}/uL — AB (ref 1500–7800)
Neutrophils Relative %: 46 %
PLATELETS: 231 10*3/uL (ref 140–400)
RBC: 5.05 MIL/uL (ref 3.80–5.10)
RDW: 13.6 % (ref 11.0–15.0)
WBC: 2.9 10*3/uL — AB (ref 3.8–10.8)

## 2016-08-30 LAB — COMPREHENSIVE METABOLIC PANEL
ALT: 22 U/L (ref 6–29)
AST: 25 U/L (ref 10–35)
Albumin: 4.3 g/dL (ref 3.6–5.1)
Alkaline Phosphatase: 80 U/L (ref 33–130)
BILIRUBIN TOTAL: 0.5 mg/dL (ref 0.2–1.2)
BUN: 16 mg/dL (ref 7–25)
CHLORIDE: 109 mmol/L (ref 98–110)
CO2: 20 mmol/L (ref 20–31)
CREATININE: 0.91 mg/dL (ref 0.50–1.05)
Calcium: 9.2 mg/dL (ref 8.6–10.4)
Glucose, Bld: 99 mg/dL (ref 65–99)
Potassium: 4.1 mmol/L (ref 3.5–5.3)
SODIUM: 141 mmol/L (ref 135–146)
TOTAL PROTEIN: 7.1 g/dL (ref 6.1–8.1)

## 2016-08-30 LAB — AMYLASE: Amylase: 57 U/L (ref 0–105)

## 2016-08-30 LAB — LIPASE: Lipase: 86 U/L — ABNORMAL HIGH (ref 7–60)

## 2016-08-30 NOTE — Progress Notes (Signed)
Lab only visit 

## 2016-08-31 ENCOUNTER — Encounter: Payer: Self-pay | Admitting: Family Medicine

## 2016-08-31 DIAGNOSIS — G43709 Chronic migraine without aura, not intractable, without status migrainosus: Secondary | ICD-10-CM | POA: Insufficient documentation

## 2016-09-17 ENCOUNTER — Ambulatory Visit (INDEPENDENT_AMBULATORY_CARE_PROVIDER_SITE_OTHER): Payer: Medicare Other | Admitting: Family Medicine

## 2016-09-17 ENCOUNTER — Encounter: Payer: Self-pay | Admitting: Family Medicine

## 2016-09-17 VITALS — BP 114/76 | HR 84 | Temp 97.8°F | Resp 16 | Ht 65.0 in | Wt 174.4 lb

## 2016-09-17 DIAGNOSIS — R748 Abnormal levels of other serum enzymes: Secondary | ICD-10-CM | POA: Diagnosis not present

## 2016-09-17 DIAGNOSIS — D72810 Lymphocytopenia: Secondary | ICD-10-CM | POA: Diagnosis not present

## 2016-09-17 DIAGNOSIS — J301 Allergic rhinitis due to pollen: Secondary | ICD-10-CM

## 2016-09-17 DIAGNOSIS — Z23 Encounter for immunization: Secondary | ICD-10-CM

## 2016-09-17 DIAGNOSIS — G43709 Chronic migraine without aura, not intractable, without status migrainosus: Secondary | ICD-10-CM | POA: Diagnosis not present

## 2016-09-17 DIAGNOSIS — M3219 Other organ or system involvement in systemic lupus erythematosus: Secondary | ICD-10-CM | POA: Diagnosis not present

## 2016-09-17 DIAGNOSIS — K219 Gastro-esophageal reflux disease without esophagitis: Secondary | ICD-10-CM

## 2016-09-17 DIAGNOSIS — F5104 Psychophysiologic insomnia: Secondary | ICD-10-CM

## 2016-09-17 DIAGNOSIS — F418 Other specified anxiety disorders: Secondary | ICD-10-CM

## 2016-09-17 DIAGNOSIS — I10 Essential (primary) hypertension: Secondary | ICD-10-CM

## 2016-09-17 MED ORDER — FLUTICASONE PROPIONATE 50 MCG/ACT NA SUSP: 2.0000 | Freq: Every day | NASAL | 11 refills | Status: AC

## 2016-09-17 NOTE — Patient Instructions (Addendum)
   IF you received an x-ray today, you will receive an invoice from [facility]. Please contact [facility] at [phone] with questions or concerns regarding your invoice.   IF you received labwork today, you will receive an invoice from [facility]. Please contact [facility] at [phone] with questions or concerns regarding your invoice.   Our billing staff will not be able to assist you with questions regarding bills from these companies.  You will be contacted with the lab results as soon as they are available. The fastest way to get your results is to activate your My Chart account. Instructions are located on the last page of this paperwork. If you have not heard from us regarding the results in 2 weeks, please contact this office.    Influenza (Flu) Vaccine (Inactivated or Recombinant):  1. Why get vaccinated? Influenza ("flu") is a contagious disease that spreads around the [place] every year, usually between October and May. Flu is caused by influenza viruses, and is spread mainly by coughing, sneezing, and close contact. Anyone can get flu. Flu strikes suddenly and can last several days. Symptoms vary by age, but can include:  fever/chills  sore throat  muscle aches  fatigue  cough  headache  runny or stuffy nose Flu can also lead to pneumonia and blood infections, and cause diarrhea and seizures in children. If you have a medical condition, such as heart or lung disease, flu can make it worse. Flu is more dangerous for some people. Infants and young children, people 65 years of age and older, pregnant women, and people with certain health conditions or a weakened immune system are at greatest risk. Each year thousands of people in the [place] die from flu, and many more are hospitalized. Flu vaccine can:  keep you from getting flu,  make flu less severe if you do get it, and  keep you from spreading flu to  your family and other people. 2. Inactivated and recombinant flu vaccines A dose of flu vaccine is recommended every flu season. Children 6 months through 8 years of age may need two doses during the same flu season. Everyone else needs only one dose each flu season. Some inactivated flu vaccines contain a very small amount of a mercury-based preservative called thimerosal. Studies have not shown thimerosal in vaccines to be harmful, but flu vaccines that do not contain thimerosal are available. There is no live flu virus in flu shots. They cannot cause the flu. There are many flu viruses, and they are always changing. Each year a new flu vaccine is made to protect against three or four viruses that are likely to cause disease in the upcoming flu season. But even when the vaccine doesn't exactly match these viruses, it may still provide some protection. Flu vaccine cannot prevent:  flu that is caused by a virus not covered by the vaccine, or  illnesses that look like flu but are not. It takes about 2 weeks for protection to develop after vaccination, and protection lasts through the flu season. 3. Some people should not get this vaccine Tell the person who is giving you the vaccine:  If you have any severe, life-threatening allergies. If you ever had a life-threatening allergic reaction after a dose of flu vaccine, or have a severe allergy to any part of this vaccine, you may be advised not to get vaccinated. Most, but not all, types of flu vaccine contain a small amount of egg protein.    If you ever had Guillain-Barre Syndrome (also called GBS). Some people with a history of GBS should not get this vaccine. This should be discussed with your doctor.  If you are not feeling well. It is usually okay to get flu vaccine when you have a mild illness, but you might be asked to come back when you feel better. 4. Risks of a vaccine reaction With any medicine, including vaccines, there is a chance of  reactions. These are usually mild and go away on their own, but serious reactions are also possible. Most people who get a flu shot do not have any problems with it. Minor problems following a flu shot include:  soreness, redness, or swelling where the shot was given  hoarseness  sore, red or itchy eyes  cough  fever  aches  headache  itching  fatigue If these problems occur, they usually begin soon after the shot and last 1 or 2 days. More serious problems following a flu shot can include the following:  There may be a small increased risk of Guillain-Barre Syndrome (GBS) after inactivated flu vaccine. This risk has been estimated at 1 or 2 additional cases per million people vaccinated. This is much lower than the risk of severe complications from flu, which can be prevented by flu vaccine.  Young children who get the flu shot along with pneumococcal vaccine (PCV13) and/or DTaP vaccine at the same time might be slightly more likely to have a seizure caused by fever. Ask your doctor for more information. Tell your doctor if a child who is getting flu vaccine has ever had a seizure. Problems that could happen after any injected vaccine:  People sometimes faint after a medical procedure, including vaccination. Sitting or lying down for about 15 minutes can help prevent fainting, and injuries caused by a fall. Tell your doctor if you feel dizzy, or have vision changes or ringing in the ears.  Some people get severe pain in the shoulder and have difficulty moving the arm where a shot was given. This happens very rarely.  Any medication can cause a severe allergic reaction. Such reactions from a vaccine are very rare, estimated at about 1 in a million doses, and would happen within a few minutes to a few hours after the vaccination. As with any medicine, there is a very remote chance of a vaccine causing a serious injury or death. The safety of vaccines is always being monitored. For  more information, visit: www.cdc.gov/vaccinesafety/ 5. What if there is a serious reaction? What should I look for?  Look for anything that concerns you, such as signs of a severe allergic reaction, very high fever, or unusual behavior. Signs of a severe allergic reaction can include hives, swelling of the face and throat, difficulty breathing, a fast heartbeat, dizziness, and weakness. These would start a few minutes to a few hours after the vaccination. What should I do?  If you think it is a severe allergic reaction or other emergency that can't wait, call 9-1-1 and get the person to the nearest hospital. Otherwise, call your doctor.  Reactions should be reported to the Vaccine Adverse Event Reporting System (VAERS). Your doctor should file this report, or you can do it yourself through the VAERS web site at www.vaers.hhs.gov, or by calling 1-800-822-7967. VAERS does not give medical advice. 6. The National Vaccine Injury Compensation Program The National Vaccine Injury Compensation Program (VICP) is a federal program that was created to compensate people who may have been   injured by certain vaccines. Persons who believe they may have been injured by a vaccine can learn about the program and about filing a claim by calling 1-800-338-2382 or visiting the VICP website at www.hrsa.gov/vaccinecompensation. There is a time limit to file a claim for compensation. 7. How can I learn more?  Ask your healthcare provider. He or she can give you the vaccine package insert or suggest other sources of information.  Call your local or state health department.  Contact the Centers for Disease Control and Prevention (CDC):  Call 1-800-232-4636 (1-800-CDC-INFO) or  Visit CDC's website at www.cdc.gov/flu Vaccine Information Statement Inactivated Influenza Vaccine (06/17/2014)   This information is not intended to replace advice given to you by your health care provider. Make sure you discuss any  questions you have with your health care provider.   Document Released: 08/22/2006 Document Revised: 11/18/2014 Document Reviewed: 06/20/2014 Elsevier Interactive Patient Education 2016 [employer]  

## 2016-09-17 NOTE — Progress Notes (Signed)
Subjective:    Patient ID: Abigail [name], female    DOB: Mar 25, 1961, 55 y.o.   MRN: [id number]  09/17/2016  Follow-up (Lupus, flu shot) and Medication Refill (Flonase)   HPI This 55 y.o. female presents for six week follow-up.  BP Readings from Last 3 Encounters:  09/17/16 114/76  07/31/16 120/78  05/27/16 102/76    Wt Readings from Last 3 Encounters:  09/17/16 174 lb 6.4 oz (79.1 kg)  07/31/16 179 lb 6.4 oz (81.4 kg)  05/27/16 193 lb 9.6 oz (87.8 kg)    Biliary dyskinesia/Elevated lipase; s/p MRCP six months ago; recommended genetic testing yet no one would let know cost of testing. Chose not to undergo testing. Has been having abdominal pain.  Lipase unchanged from last visit.  Abigail [name] previously prescribed Ativan with improvement in abdominal pain. Prescribed Ativan for ever for abdominal pain.  Clonazepam has a longer half life so not having spikes.    Obesity: weight down 45 pounds; cannot wait until son comes home; son last saw patient in June.  Has been using [email or web address]://www.matthews.info/.  Also has new clothes now.  The interim clothing are gone; had to return them.  Has a very small wardrobe.    Constipation: still takes potassium.  Also has increase in palptiations.  Not everyday.  Only takes when symptomatic.  Limits it.  Migraines: taking Verapamil four times per week; still having migraines; if does not take Topamax, has a horrible migraine.  Needs both Topamax and Verapamil.  Some days blood pressure 90/60.    Anxiety: s/p counseling twice.  Last visit 1.5 years ago until recently.  Taking Clonazepam daily.  Ativan also helped pancreas.   Review of Systems  Constitutional: Negative for chills, diaphoresis, fatigue and fever.  Eyes: Negative for visual disturbance.  Respiratory: Negative for cough and shortness of breath.   Cardiovascular: Negative for chest pain, palpitations and leg swelling.  Gastrointestinal: Positive for abdominal pain and constipation. Negative for  abdominal distention, anal bleeding, blood in stool, diarrhea, nausea and vomiting.  Endocrine: Negative for cold intolerance, heat intolerance, polydipsia, polyphagia and polyuria.  Neurological: Negative for dizziness, tremors, seizures, syncope, facial asymmetry, speech difficulty, weakness, light-headedness, numbness and headaches.  Psychiatric/Behavioral: Positive for sleep disturbance. Negative for dysphoric mood, self-injury and suicidal ideas. The patient is nervous/anxious.     Past Medical History:  Diagnosis Date  . Asthma   . Complication of anesthesia    Sjogrens Syndrome  . Depression   . Diverticulitis   . Duodenitis   . Fibromyalgia   . Gastritis   . GERD (gastroesophageal reflux disease)   . Hypertension   . Lupus   . Melanoma ([place]) 2008   Back; [place].  . Migraine   . Obstructive sleep apnea 12/25/2012  . Pancreatitis   . 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 Sjogren's syndrome ([place])   . SLE (systemic lupus erythematosus) ([place])   . Urticaria   . 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 ABDOMINAL HYSTERECTOMY     Partial; Ovaries intact.  DUB/fibroids.  . ABDOMINOPLASTY    . ANTERIOR CERVICAL DECOMP/DISCECTOMY FUSION  10/29/2011   Procedure: ANTERIOR CERVICAL DECOMPRESSION/DISCECTOMY FUSION 2 LEVELS;  Surgeon: Floyce Stakes;  Location: [facility];  Service: Neurosurgery;  Laterality: N/A;  Cervical five-six,Cervical six-seven nterior cervical decompression/diskectomy, fusion, plate  . APPENDECTOMY    . BACK SURGERY    . CERVICAL FUSION    .  CHOLECYSTECTOMY    . COLONOSCOPY  11/11/2008  . COLONOSCOPY N/A 09/20/2013   Procedure: COLONOSCOPY;  Surgeon: Lafayette Dragon, MD;  Location: [facility];  Service: Endoscopy;  Laterality: N/A;  . LEFT HEART CATHETERIZATION WITH CORONARY ANGIOGRAM N/A 06/15/2014   Procedure: LEFT HEART CATHETERIZATION WITH CORONARY ANGIOGRAM;  Surgeon: Peter M Martinique, MD;   Location: [facility] CATH LAB;  Service: Cardiovascular;  Laterality: N/A;  . LIVER BIOPSY    . MELANOMA EXCISION    . PANCREAS SURGERY     sphincterotomy  . REDUCTION MAMMAPLASTY Bilateral   . SIGMOIDECTOMY  03/2012   done at [facility]  . Sleep Study  10/11/2012   +severe OSA.  [facility].  . TMJ ARTHROPLASTY  1970  . ULTRASOUND GUIDANCE FOR VASCULAR ACCESS  06/15/2014   Procedure: ULTRASOUND GUIDANCE FOR VASCULAR ACCESS;  Surgeon: Peter M Martinique, MD;  Location: [facility] CATH LAB;  Service: Cardiovascular;;   Allergies  Allergen Reactions  . Shrimp [Shellfish Allergy] Shortness Of Breath  . Meperidine Hcl Other (See Comments)    tachycardia  . Promethazine Hcl Other (See Comments)    "uncontrolled vomiting"  . Doxycycline Hives  . Latex Other (See Comments)    Breathing problems.  . Sulfonamide Derivatives Hives  . Tetracyclines & Related Rash    Social History   Social History  . Marital status: Married    Spouse name: [name]  . Number of children: 2  . Years of education: Bachelors   Occupational History  .  [employer]   Social History Main Topics  . Smoking status: Never Smoker  . Smokeless tobacco: Never Used  . Alcohol use Yes     Comment: 01/20/12 once/month  . Drug use: No  . Sexual activity: Yes    Partners: Male   Other Topics Concern  . Not on file   Social History Narrative   Marital status: married x 28 years to [name]; happily married; no abuse      Children: 2 children (13 yo son, 63 yo daughter); no grandchildren      Lives with: husband, daughter      Employment:  [employer] 2009-2013.  Resigned 08/2012.  Bachelor degrees in communication.     Patient works as a Surveyor, minerals.   Patient drinks 2 caffeine beverages daily.   Patient is left -handed.         Tobacco:  None      Alcohol:  Once weekly; wine      Drugs: none      Exercise:  none               Family History  Problem Relation Age of Onset  . Colon polyps Sister   . Depression Sister     suicide  . Colon  polyps Brother   . Colon polyps Mother   . Dementia Mother   . Heart disease Father 49    CHF  . Macular degeneration Father   . Colon cancer Paternal Uncle        Objective:    BP 114/76   Pulse 84   Temp 97.8 F (36.6 C) (Oral)   Resp 16   Ht 5\' 5"  (1.651 m)   Wt 174 lb 6.4 oz (79.1 kg)   SpO2 95%   BMI 29.02 kg/m  Physical Exam  Constitutional: She is oriented to person, place, and time. She appears well-developed and well-nourished. No distress.  HENT:  Head: Normocephalic and atraumatic.  Right Ear: External ear normal.  Left Ear: External ear normal.  Nose: Nose normal.  Mouth/Throat: Oropharynx is clear and moist.  Eyes: Conjunctivae and EOM are normal. Pupils are equal, round, and reactive to light.  Neck: Normal range of motion. Neck supple. Carotid bruit is not present. No thyromegaly present.  Cardiovascular: Normal rate, regular rhythm, normal heart sounds and intact distal pulses.  Exam reveals no gallop and no friction rub.   No murmur heard. Pulmonary/Chest: Effort normal and breath sounds normal. She has no wheezes. She has no rales.  Abdominal: Soft. Bowel sounds are normal. She exhibits no distension and no mass. There is no tenderness. There is no rebound and no guarding.  Lymphadenopathy:    She has no cervical adenopathy.  Neurological: She is alert and oriented to person, place, and time. No cranial nerve deficit.  Skin: Skin is warm and dry. No rash noted. She is not diaphoretic. No erythema. No pallor.  Psychiatric: She has a normal mood and affect. Her behavior is normal.        Assessment & Plan:   1. Elevated lipase   2. Depression with anxiety   3. Lymphocytopenia   4. Chronic migraine without aura without status migrainosus, not intractable   5. Essential hypertension, benign   6. Gastroesophageal reflux disease without esophagitis   7. Other systemic lupus erythematosus with other organ involvement ([place])   8. Psychophysiological  insomnia   9. Needs flu shot   10. Acute seasonal allergic rhinitis due to pollen    -emotionally improving since last visit; intolerant to higher doses of Qsymia. -stable on current dose of Topamax for migraine prevention. -rx for Flonase for allergic rhinitis. -with recent elevation of Lipase and low WBC, repeat labs today.  S/p GI consultation regarding elevation of lipase.  S/p MRCP in past year.  -Ativan works better for biliary pain; may want to switch back to Ativan.  Orders Placed This Encounter  Procedures  . Flu Vaccine QUAD 36+ mos IM  . CBC with Differential/Platelet  . Comprehensive metabolic panel  . Lipase   Meds ordered this encounter  Medications  . fluticasone (FLONASE) 50 MCG/ACT nasal spray    Sig: Place 2 sprays into both nostrils daily.    Dispense:  16 g    Refill:  11    No Follow-up on file.  Hardeep Reetz Elayne Guerin, M.D. [facility] [address] [place], [place]  [address] [phone] phone [phone] fax

## 2016-09-18 LAB — CBC WITH DIFFERENTIAL/PLATELET
BASOS PCT: 1 %
Basophils Absolute: 37 cells/uL (ref 0–200)
EOS ABS: 74 {cells}/uL (ref 15–500)
Eosinophils Relative: 2 %
HEMATOCRIT: 40 % (ref 35.0–45.0)
HEMOGLOBIN: 13.5 g/dL (ref 11.7–15.5)
LYMPHS ABS: 1443 {cells}/uL (ref 850–3900)
LYMPHS PCT: 39 %
MCH: 28.1 pg (ref 27.0–33.0)
MCHC: 33.8 g/dL (ref 32.0–36.0)
MCV: 83.3 fL (ref 80.0–100.0)
MONO ABS: 333 {cells}/uL (ref 200–950)
MPV: 11.2 fL (ref 7.5–12.5)
Monocytes Relative: 9 %
NEUTROS PCT: 49 %
Neutro Abs: 1813 cells/uL (ref 1500–7800)
Platelets: 215 10*3/uL (ref 140–400)
RBC: 4.8 MIL/uL (ref 3.80–5.10)
RDW: 14.2 % (ref 11.0–15.0)
WBC: 3.7 10*3/uL — ABNORMAL LOW (ref 3.8–10.8)

## 2016-09-18 LAB — COMPREHENSIVE METABOLIC PANEL
ALBUMIN: 4.2 g/dL (ref 3.6–5.1)
ALK PHOS: 72 U/L (ref 33–130)
ALT: 19 U/L (ref 6–29)
AST: 24 U/L (ref 10–35)
BILIRUBIN TOTAL: 0.3 mg/dL (ref 0.2–1.2)
BUN: 23 mg/dL (ref 7–25)
CALCIUM: 9.5 mg/dL (ref 8.6–10.4)
CO2: 24 mmol/L (ref 20–31)
Chloride: 106 mmol/L (ref 98–110)
Creat: 0.91 mg/dL (ref 0.50–1.05)
GLUCOSE: 87 mg/dL (ref 65–99)
POTASSIUM: 4.1 mmol/L (ref 3.5–5.3)
Sodium: 139 mmol/L (ref 135–146)
Total Protein: 6.9 g/dL (ref 6.1–8.1)

## 2016-09-18 LAB — LIPASE: LIPASE: 130 U/L — AB (ref 7–60)

## 2016-09-25 ENCOUNTER — Other Ambulatory Visit: Payer: Self-pay | Admitting: Family Medicine

## 2016-09-30 ENCOUNTER — Telehealth: Payer: Self-pay | Admitting: Neurology

## 2016-09-30 NOTE — Telephone Encounter (Signed)
Approved refill of Lorazepam; please call in as approved.

## 2016-09-30 NOTE — Telephone Encounter (Signed)
Called in.

## 2016-09-30 NOTE — Telephone Encounter (Signed)
Pt called in to ask about getting botox for headaches.

## 2016-10-01 NOTE — Telephone Encounter (Signed)
Rn call patient about wanting Botox switch back to Dr. Jaynee Eagles. Pt was getting botox from Dr. Aurther Loft. Pt wants botox for headaches. Rn stated Danielle(botox referral) was consulted about her getting schedule. Rn stated Andee Poles gave the okay to schedule her. Rn explain to patient that Andee Poles will work on [employer] and other paperwork. Rn and patient discuss Dec 3 thru Dec 8. The pt decided to schedule the following week because of the thanksgiving holidays days. PT requested Dec 11 thru Dec 15. Rn offer patient an appt for 0400 and 1200pm on Wed, and thur. PT decline because of her work schedule. PT finally stated she can come Dec 11 at 1200pm. Rn notified Danielle of this change.

## 2016-10-07 DIAGNOSIS — J301 Allergic rhinitis due to pollen: Secondary | ICD-10-CM | POA: Insufficient documentation

## 2016-10-09 NOTE — Telephone Encounter (Signed)
Noted, thank you Abigail [name].

## 2016-10-12 ENCOUNTER — Other Ambulatory Visit: Payer: Self-pay | Admitting: Family Medicine

## 2016-10-13 NOTE — Telephone Encounter (Signed)
09/2016 last ov and labs 

## 2016-10-21 ENCOUNTER — Encounter: Payer: Self-pay | Admitting: Neurology

## 2016-10-21 ENCOUNTER — Ambulatory Visit (INDEPENDENT_AMBULATORY_CARE_PROVIDER_SITE_OTHER): Payer: Medicare Other | Admitting: Neurology

## 2016-10-21 VITALS — BP 122/84 | HR 72

## 2016-10-21 DIAGNOSIS — G43709 Chronic migraine without aura, not intractable, without status migrainosus: Secondary | ICD-10-CM | POA: Diagnosis not present

## 2016-10-21 NOTE — Progress Notes (Signed)
Botox-100unitsx2 vials Lot: [id number] Expiration: 04/2019 [id number]  0.9% Sodium Chloride bacteriostatic- 4mL total Lot:[id number] Expiration: 04/11/2018 NDC: [id number]  Dx: [id number] [employer] and [employer] [place]

## 2016-10-23 NOTE — Progress Notes (Signed)

## 2016-10-29 ENCOUNTER — Ambulatory Visit: Payer: Medicare Other | Admitting: Family Medicine

## 2016-10-30 DIAGNOSIS — Z9889 Other specified postprocedural states: Secondary | ICD-10-CM | POA: Insufficient documentation

## 2016-10-31 ENCOUNTER — Ambulatory Visit (INDEPENDENT_AMBULATORY_CARE_PROVIDER_SITE_OTHER): Payer: Medicare Other | Admitting: Physician Assistant

## 2016-10-31 VITALS — BP 116/72 | HR 83 | Temp 98.0°F | Resp 16 | Ht 65.0 in | Wt 171.0 lb

## 2016-10-31 DIAGNOSIS — R1084 Generalized abdominal pain: Secondary | ICD-10-CM

## 2016-10-31 LAB — POCT URINALYSIS DIP (MANUAL ENTRY)
Bilirubin, UA: NEGATIVE
Blood, UA: NEGATIVE
Glucose, UA: NEGATIVE
Ketones, POC UA: NEGATIVE
LEUKOCYTES UA: NEGATIVE
NITRITE UA: NEGATIVE
PROTEIN UA: NEGATIVE
SPEC GRAV UA: 1.01
UROBILINOGEN UA: 0.2
pH, UA: 7

## 2016-10-31 LAB — POC MICROSCOPIC URINALYSIS (UMFC): MUCUS RE: ABSENT

## 2016-10-31 MED ORDER — ONDANSETRON 8 MG PO TBDP: 8.0000 mg | ORAL_TABLET | Freq: Three times a day (TID) | ORAL | 0 refills | Status: DC | PRN

## 2016-10-31 NOTE — Patient Instructions (Signed)
     IF you received an x-ray today, you will receive an invoice from [facility]. Please contact [facility] at [phone] with questions or concerns regarding your invoice.   IF you received labwork today, you will receive an invoice from [facility]. Please contact [facility] at [phone] with questions or concerns regarding your invoice.   Our billing staff will not be able to assist you with questions regarding bills from these companies.  You will be contacted with the lab results as soon as they are available. The fastest way to get your results is to activate your My Chart account. Instructions are located on the last page of this paperwork. If you have not heard from us regarding the results in 2 weeks, please contact this office.     

## 2016-10-31 NOTE — Progress Notes (Signed)
Patient ID: Abigail [name], female    DOB: 04-May-1961, 55 y.o.   MRN: [id number]  PCP: Reginia Forts, MD  Chief Complaint  Patient presents with  . Abdominal Pain    Middle abdominal pain began Tuesday     Subjective:   Presents for evaluation of abdominal pain. She is accompanied by her husband.  She has chronic abdominal pain, for which she sees GI at [facility]. For the past several weeks, she has been needing ondansetron for nausea daily. Chronic constipation is managed with OTC Miralax.  Yesterday while in [place] after her rheumatology visit she was shopping and experienced sudden onset debilitating abdominal pain. She took a dose of hydrocodone and felt better. Later in the evening she went to a church viewing of "It's a Wonderful Life" when the pain recurred.  She presents today requesting UA to screen for a kidney stone and a lipase. Specifically, she's looking for something that would prompt her to see her GI specialist sooner than currently planned, or may need other treatment.  She notes that she had surgery to address sphincter of Oddi dysfunction about 20 years ago, and has been told it may fail. The pain she had yesterday is consistent with the pain she was having before that surgery.  No fever, chills. No diarrhea. No CP, SOB She has a migraine, her typical headache  Review of Systems As above.    Patient Active Problem List   Diagnosis Date Noted  . History of sphincterotomy of sphincter of Oddi 10/30/2016  . Acute seasonal allergic rhinitis due to pollen 10/07/2016  . Chronic migraine without aura without status migrainosus, not intractable 08/31/2016  . Prediabetes 03/19/2016  . SUI (stress urinary incontinence, female) 12/22/2015  . Morbid drug-induced obesity 12/18/2015  . Mild intermittent asthma without complication [id number]  . DDD (degenerative disc disease), cervical 07/05/2014  . Unstable angina ([place]) 06/14/2014  . OSA on CPAP 06/07/2014  . Obesity  (BMI 30-39.9) 06/07/2014  . SS-B antibody positive 06/07/2014  . Menopause 10/18/2012  . Essential hypertension, benign 10/18/2012  . Melanoma ([place]) 10/02/2012  . SLE (systemic lupus erythematosus) ([place]) 08/17/2012  . Dysplastic nevus 08/04/2012  . ADD (attention deficit disorder) 02/26/2012  . Depression with anxiety 02/26/2012  . Insomnia 02/26/2012  . Reactive airways 01/20/2012  . 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 DIVERTICULITIS, COLON 10/03/2010  . GERD 01/08/2010  . [place] SYNDROME 01/08/2010  . CHOLECYSTECTOMY, HX OF 01/08/2010     Prior to Admission medications   Medication Sig Start Date End Date Taking? Authorizing Provider  albuterol (PROAIR HFA) 108 (90 BASE) MCG/ACT inhaler Use 2 puffs every 6 hours as needed for shortness of breath 10/10/15  Yes Orma Flaming, MD  clonazePAM (KLONOPIN) 1 MG tablet Take 1 tablet (1 mg total) by mouth 2 (two) times daily as needed for anxiety. 07/31/16  Yes Wardell Honour, MD  fluorometholone (FML) 0.1 % ophthalmic suspension  09/27/15  Yes Historical Provider, MD  fluticasone (FLONASE) 50 MCG/ACT nasal spray Place 2 sprays into both nostrils daily. 09/17/16  Yes Wardell Honour, MD  hydroxychloroquine (PLAQUENIL) 200 MG tablet Take 200 mg by mouth 2 (two) times daily. 11/01/15 10/31/16 Yes Historical Provider, MD  Lifitegrast Shirley Friar) 5 % SOLN Apply 1 drop to eye daily.   Yes Historical Provider, MD  LORazepam (ATIVAN) 1 MG tablet TAKE 1 TABLET TWICE DAILY AS NEEDED FOR ANXIETY. 09/30/16  Yes Wardell Honour, MD  omeprazole (PRILOSEC) 40 MG capsule Take 40 mg  by mouth daily.   Yes Historical Provider, MD  potassium chloride (KLOR-CON) 20 MEQ packet Take 20 mEq by mouth daily as needed. Take when potassium is low 06/07/14  Yes Barton Fanny, MD  topiramate (TOPAMAX) 25 MG capsule Take 1 capsule (25 mg total) by mouth 2 (two) times daily. 07/31/16  Yes Wardell Honour, MD  verapamil (CALAN-SR) 120 MG CR tablet TAKE ONE TABLET AT  BEDTIME. 10/13/16  Yes Wardell Honour, MD  zolpidem (AMBIEN) 10 MG tablet TAKE 1 TABLET AT BEDTIME AS NEEDED FOR SLEEP. 07/12/15  Yes Wardell Honour, MD     Allergies  Allergen Reactions  . Shrimp [Shellfish Allergy] Shortness Of Breath  . Meperidine Hcl Other (See Comments)    tachycardia  . Promethazine Hcl Other (See Comments)    "uncontrolled vomiting"  . Doxycycline Hives  . Latex Other (See Comments)    Breathing problems.  . Sulfonamide Derivatives Hives  . Tetracyclines & Related Rash       Objective:  Physical Exam  Constitutional: She is oriented to person, place, and time. She appears well-developed and well-nourished. She is active and cooperative. No distress.  BP 116/72   Pulse 83   Temp 98 F (36.7 C) (Oral)   Resp 16   Ht 5\' 5"  (1.651 m)   Wt 171 lb (77.6 kg)   SpO2 95%   BMI 28.46 kg/m   HENT:  Head: Normocephalic and atraumatic.  Right Ear: Hearing normal.  Left Ear: Hearing normal.  Eyes: Conjunctivae are normal. No scleral icterus.  Neck: Normal range of motion. Neck supple. No thyromegaly present.  Cardiovascular: Normal rate, regular rhythm and normal heart sounds.   Pulses:      Radial pulses are 2+ on the right side, and 2+ on the left side.  Pulmonary/Chest: Effort normal and breath sounds normal.  Abdominal: Normal appearance. Bowel sounds are decreased. There is generalized tenderness. There is no CVA tenderness.  Well healed surgical scars. She notes that decreased BS is her baseline.  Lymphadenopathy:       Head (right side): No tonsillar, no preauricular, no posterior auricular and no occipital adenopathy present.       Head (left side): No tonsillar, no preauricular, no posterior auricular and no occipital adenopathy present.    She has no cervical adenopathy.       Right: No supraclavicular adenopathy present.       Left: No supraclavicular adenopathy present.  Neurological: She is alert and oriented to person, place, and time. No  sensory deficit.  Skin: Skin is warm, dry and intact. No rash noted. No cyanosis or erythema. Nails show no clubbing.  Psychiatric: She has a normal mood and affect. Her speech is normal and behavior is normal.    Results for orders placed or performed in visit on 10/31/16  POCT urinalysis dipstick  Result Value Ref Range   Color, UA yellow yellow   Clarity, UA clear clear   Glucose, UA negative negative   Bilirubin, UA negative negative   Ketones, POC UA negative negative   Spec Grav, UA 1.010    Blood, UA negative negative   pH, UA 7.0    Protein Ur, POC negative negative   Urobilinogen, UA 0.2    Nitrite, UA Negative Negative   Leukocytes, UA Negative Negative  POCT Microscopic Urinalysis ([facility])  Result Value Ref Range   WBC,UR,HPF,POC None None WBC/hpf   RBC,UR,HPF,POC None None RBC/hpf   Bacteria None None, Too  numerous to count   Mucus Absent Absent   Epithelial Cells, UR Per Microscopy Few (A) None, Too numerous to count cells/hpf          Assessment & Plan:   1. Generalized abdominal pain Unclear etiology. Unlikely to be a kidney stone. Refilled ondansetron. Await other labs. - CBC with Differential/Platelet - Comprehensive metabolic panel - POCT urinalysis dipstick - POCT Microscopic Urinalysis ([facility])   Fara Chute, PA-C Physician Assistant-Certified [facility]

## 2016-11-01 LAB — CBC WITH DIFFERENTIAL/PLATELET
BASOS ABS: 0 10*3/uL (ref 0.0–0.2)
BASOS: 1 %
EOS (ABSOLUTE): 0.1 10*3/uL (ref 0.0–0.4)
Eos: 3 %
Hematocrit: 41.1 % (ref 34.0–46.6)
Hemoglobin: 13.9 g/dL (ref 11.1–15.9)
IMMATURE GRANS (ABS): 0 10*3/uL (ref 0.0–0.1)
IMMATURE GRANULOCYTES: 0 %
LYMPHS: 34 %
Lymphocytes Absolute: 1.5 10*3/uL (ref 0.7–3.1)
MCH: 28.1 pg (ref 26.6–33.0)
MCHC: 33.8 g/dL (ref 31.5–35.7)
MCV: 83 fL (ref 79–97)
Monocytes Absolute: 0.4 10*3/uL (ref 0.1–0.9)
Monocytes: 10 %
NEUTROS PCT: 52 %
Neutrophils Absolute: 2.3 10*3/uL (ref 1.4–7.0)
PLATELETS: 222 10*3/uL (ref 150–379)
RBC: 4.95 x10E6/uL (ref 3.77–5.28)
RDW: 13.9 % (ref 12.3–15.4)
WBC: 4.3 10*3/uL (ref 3.4–10.8)

## 2016-11-01 LAB — COMPREHENSIVE METABOLIC PANEL WITH GFR
ALT: 32 IU/L (ref 0–32)
AST: 31 IU/L (ref 0–40)
Albumin/Globulin Ratio: 1.5 (ref 1.2–2.2)
Albumin: 4.3 g/dL (ref 3.5–5.5)
Alkaline Phosphatase: 97 IU/L (ref 39–117)
BUN/Creatinine Ratio: 19 (ref 9–23)
BUN: 15 mg/dL (ref 6–24)
Bilirubin Total: 0.2 mg/dL (ref 0.0–1.2)
CO2: 26 mmol/L (ref 18–29)
Calcium: 9.4 mg/dL (ref 8.7–10.2)
Chloride: 103 mmol/L (ref 96–106)
Creatinine, Ser: 0.79 mg/dL (ref 0.57–1.00)
GFR calc Af Amer: 97 mL/min/1.73 (ref >59)
GFR calc non Af Amer: 84 mL/min/1.73 (ref >59)
Globulin, Total: 2.9 g/dL (ref 1.5–4.5)
Glucose: 86 mg/dL (ref 65–99)
Potassium: 4.1 mmol/L (ref 3.5–5.2)
Sodium: 142 mmol/L (ref 134–144)
Total Protein: 7.2 g/dL (ref 6.0–8.5)

## 2016-11-06 ENCOUNTER — Telehealth: Payer: Self-pay

## 2016-11-06 ENCOUNTER — Encounter: Payer: Self-pay | Admitting: Physician Assistant

## 2016-11-06 NOTE — Telephone Encounter (Signed)
Pt looking for lab results. They look normal to me, please advise

## 2016-11-06 NOTE — Telephone Encounter (Signed)
Abigail [name] - Pt wants to know her lab results and asks that you please release to Mychart.

## 2016-11-06 NOTE — Telephone Encounter (Signed)
Please call this patient. I checked on the result on Saturday, and then again today, but there is not a result yet. We are calling the lab NOW.

## 2016-11-07 ENCOUNTER — Telehealth: Payer: Self-pay | Admitting: *Deleted

## 2016-11-07 NOTE — Telephone Encounter (Signed)
Other labs already reviewed with her and/or released. Lipase is still pending.

## 2016-11-07 NOTE — Telephone Encounter (Signed)
Please see labs cbc,cmp and urine ,there now?

## 2016-11-15 ENCOUNTER — Other Ambulatory Visit: Payer: Self-pay

## 2016-11-15 ENCOUNTER — Telehealth: Payer: Self-pay | Admitting: Gastroenterology

## 2016-11-15 DIAGNOSIS — Z8719 Personal history of other diseases of the digestive system: Secondary | ICD-10-CM

## 2016-11-15 DIAGNOSIS — R1012 Left upper quadrant pain: Secondary | ICD-10-CM

## 2016-11-15 LAB — LIPASE: LIPASE: 103 U/L — AB (ref 14–72)

## 2016-11-15 LAB — SPECIMEN STATUS REPORT

## 2016-11-15 NOTE — Telephone Encounter (Signed)
Called patient back, let her know that at this late hour our lab will not be open and her best option would be to go to the ER for evaluation. Patient asked about what type of pain medication to take. Advised to stay away from NSAIDs, have a bland diet, stay hydrated. She said that she had some left over hydrocodone 5/325 mg from orthopedic doctor, Dr. Havery Moros said that she may use that for pain. Cautioned on masking the pain and really encouraged to go to ER if pain becomes intolerable. I let her know that if she was able stay out of the ER over the weekend, Dr. Havery Moros would like to have some labs checked on Monday morning. Labs put into Epic.

## 2016-11-15 NOTE — Telephone Encounter (Signed)
Spoke to patient, states that she has been having worsening LUQ abdominal pain. Pain when it is bad is 7/10. She took a Klonopin approximately 2 hours ago, slept and pain is 2/10. She states she has an appointment on 1/29 with the doctor at [facility] that did the sphincterotomy in 1996, as she was advised the sphincterotomy would have to be revised at some point. Patient does not want to wait to do something, trying to avoid hospitalization. Please advise.

## 2016-11-15 NOTE — Telephone Encounter (Signed)
The concern with her history is whether or not she has pancreatitis. At this hour for her to get labs and an evaluation for this it would have to be in the ER as we are heading into the weekend and it's almost 5PM. If she thinks she has pancreatitis she needs lipase drawn, CBC, etc and vitals. Not sure how long her symptoms have been going on. If more of a chronic issue we can order CBC, lipase, and CMET to ensure stable and she should go to a bland diet, but if this is an acute issue and worsening over the weekend she would need to go to the ER. Can you please let her know? Thanks

## 2017-01-13 ENCOUNTER — Other Ambulatory Visit: Payer: Self-pay | Admitting: Family Medicine

## 2017-02-05 ENCOUNTER — Encounter: Payer: Self-pay | Admitting: Family Medicine

## 2017-02-10 MED ORDER — PHENTERMINE-TOPIRAMATE ER 7.5-46 MG PO CP24: 1.0000 | ORAL_CAPSULE | Freq: Every day | ORAL | 0 refills | Status: DC

## 2017-02-12 ENCOUNTER — Telehealth: Payer: Self-pay | Admitting: *Deleted

## 2017-02-12 NOTE — Telephone Encounter (Signed)
Called patient for new pharmacy name to fax phentermine-topiramate. She will call back with name.

## 2017-02-12 NOTE — Telephone Encounter (Signed)
Faxed Rx to [facility] in [place], per patient. Confirmation page received 5:47 pm.

## 2017-02-12 NOTE — Telephone Encounter (Signed)
Spoke to patient about Rx phentermine-topiramate, I will be able to fax it to [facility]. The pharmacy was called for confirmation, I spoke to Merrilee Seashore Warehouse manager).  I left message in patient's voice mail, but while typing message the patient called back and I told her I will fax now at the [address].

## 2017-02-12 NOTE — Telephone Encounter (Signed)
Pt is calling back to let us know that the pharmacy is [facility]   [address] [place] [address]  And if it is not able to be called in please either mail it or call her so someone else can pick it up   Best number [phone]

## 2017-02-16 ENCOUNTER — Other Ambulatory Visit: Payer: Self-pay | Admitting: Family Medicine

## 2017-02-26 ENCOUNTER — Ambulatory Visit: Payer: Self-pay | Admitting: Family Medicine

## 2017-03-05 ENCOUNTER — Ambulatory Visit: Payer: Self-pay | Admitting: Family Medicine

## 2017-03-10 ENCOUNTER — Telehealth: Payer: Self-pay | Admitting: Neurology

## 2017-03-10 NOTE — Telephone Encounter (Signed)
Pt's husband called says she wants botox appt for 5/4 in the afternoon. He was advised these appt's are usually booked out a couple of weeks. Advised Andee Poles is out of the office but a msg would be sent to [place]. Please call to advise

## 2017-03-10 NOTE — Telephone Encounter (Signed)
I have spoke to Patient she is scheduled for 03/19/2017 with Dr. Jaynee Eagles For Botox apt.  I have called [employer]

## 2017-03-10 NOTE — Telephone Encounter (Signed)
Verifying Patient's insurance with [employer] [id number] .

## 2017-03-11 NOTE — Telephone Encounter (Signed)
Pt called said she was advised she would rec a call today if any appt became available with Dr A for botox. She said she rec'd a call from [facility] today that botox has been approved for 1 yr, she did not get PA#. I advised her I didn't know if that has been rec'd by Danielle/Dana. Please call her at [phone]. Said she lives in [place] and coming to [place] tomorrow for appt with another Dr and is calling to check to see if she could see Dr A tomorrow.  She will be in town by 8:30 tomorrow.

## 2017-03-11 NOTE — Telephone Encounter (Signed)
I can see her at 9am thanks

## 2017-03-12 ENCOUNTER — Ambulatory Visit (INDEPENDENT_AMBULATORY_CARE_PROVIDER_SITE_OTHER): Payer: Medicare Other | Admitting: Family Medicine

## 2017-03-12 ENCOUNTER — Encounter: Payer: Self-pay | Admitting: Family Medicine

## 2017-03-12 VITALS — BP 122/86 | HR 80 | Temp 97.6°F | Resp 16 | Ht 65.0 in | Wt 176.2 lb

## 2017-03-12 DIAGNOSIS — R768 Other specified abnormal immunological findings in serum: Secondary | ICD-10-CM

## 2017-03-12 DIAGNOSIS — Z9889 Other specified postprocedural states: Secondary | ICD-10-CM

## 2017-03-12 DIAGNOSIS — M3219 Other organ or system involvement in systemic lupus erythematosus: Secondary | ICD-10-CM

## 2017-03-12 DIAGNOSIS — R1084 Generalized abdominal pain: Secondary | ICD-10-CM | POA: Diagnosis not present

## 2017-03-12 DIAGNOSIS — Z6829 Body mass index (BMI) 29.0-29.9, adult: Secondary | ICD-10-CM | POA: Diagnosis not present

## 2017-03-12 DIAGNOSIS — F5104 Psychophysiologic insomnia: Secondary | ICD-10-CM | POA: Diagnosis not present

## 2017-03-12 DIAGNOSIS — E7800 Pure hypercholesterolemia, unspecified: Secondary | ICD-10-CM

## 2017-03-12 DIAGNOSIS — R748 Abnormal levels of other serum enzymes: Secondary | ICD-10-CM | POA: Diagnosis not present

## 2017-03-12 DIAGNOSIS — F418 Other specified anxiety disorders: Secondary | ICD-10-CM | POA: Diagnosis not present

## 2017-03-12 DIAGNOSIS — E7439 Other disorders of intestinal carbohydrate absorption: Secondary | ICD-10-CM

## 2017-03-12 DIAGNOSIS — M3500 Sicca syndrome, unspecified: Secondary | ICD-10-CM | POA: Diagnosis not present

## 2017-03-12 LAB — POCT URINALYSIS DIP (MANUAL ENTRY)
BILIRUBIN UA: NEGATIVE mg/dL
Bilirubin, UA: NEGATIVE
Blood, UA: NEGATIVE
GLUCOSE UA: NEGATIVE mg/dL
Nitrite, UA: NEGATIVE
PROTEIN UA: NEGATIVE mg/dL
Spec Grav, UA: <=1.005 — AB (ref 1.010–1.025)
Urobilinogen, UA: 0.2 E.U./dL
pH, UA: 5.5 (ref 5.0–8.0)

## 2017-03-12 MED ORDER — ONDANSETRON 8 MG PO TBDP: 8.0000 mg | ORAL_TABLET | Freq: Three times a day (TID) | ORAL | 3 refills | Status: AC | PRN

## 2017-03-12 MED ORDER — LORAZEPAM 1 MG PO TABS: ORAL_TABLET | ORAL | 3 refills | Status: DC

## 2017-03-12 MED ORDER — POTASSIUM CHLORIDE CRYS ER 20 MEQ PO TBCR: 20.0000 meq | EXTENDED_RELEASE_TABLET | Freq: Every day | ORAL | 1 refills | Status: AC

## 2017-03-12 MED ORDER — FLUOROMETHOLONE 0.1 % OP SUSP: 1.0000 [drp] | Freq: Four times a day (QID) | OPHTHALMIC | 0 refills | Status: AC

## 2017-03-12 NOTE — Telephone Encounter (Signed)
Patient called and I spoke with her regarding injections. She wanted to know if anything was available today. I informed her that we were still waiting on approvals for the procedure code.

## 2017-03-12 NOTE — Progress Notes (Signed)
Subjective:    Patient ID: Abigail [name], female    DOB: 09-Aug-1961, 56 y.o.   MRN: [id number]  03/12/2017  Follow-up (Unspecified by patient) and Medication Refill (potassium, lorazepam, phentermine, fml)   HPI This 56 y.o. female presents for follow-up of obesity.  Sold condo in [place] in one day; bought a house in [place].  All happened in four weeks.  [name] was totally resistant.  Concern of adjustments.  Loves the neighborhood.  Has a garden.  Husband has established with memory specialist.  Slight memory issues; no follow-up needed.  All sisters live on the same street.  Four sisters went away for the weekend.  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  Wore matching outfits.  Color guards.Just like back in [place].  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  Niece works from her house.  Children are shocked.  Next sibling to pt is ten years older.  [place].  Started a little business.  Very busy.   Background is education.    Elevated Lipase: went to Gi at [facility] who performed sphincterotomy of pancreas years ago; did extensive MRI WNL.  Elevated Lipase for one year.  Follow-up with PA after MRI; sphincter of Odi spasms have spasms surrounding areas.  Will continue.  Opiates can make pain worse. Ativan makes pain go away.  Discharged from care.  Recommended Ativan per PCP.  Went in the second time and removed stents.   Obesity: up five pounds; taking Qsymia.  Lower dose is doing well.  Not exercising in past six weeks.    SLE/Sjogrens: eyes are really dry.  Medication is $300.  Rheumatology states that no studies that say it works.  Insurance will not cover.  Really likes Stonecypher; followed very regularly by [facility].    Clonazepam prescribed; takes some.  Likes ativan better.  Migraines: due for Botox.  Migraines are stable.    Immunization History  Administered Date(s) Administered  . Influenza Whole 07/12/2012  .  Influenza,inj,Quad PF,36+ Mos 08/25/2013, 09/12/2015, 09/17/2016  . Influenza-Unspecified 07/12/2014  . Pneumococcal Polysaccharide-23 11/12/2003, 02/13/2016  . Tdap 10/09/2009   BP Readings from Last 3 Encounters:  03/19/17 129/85  03/12/17 122/86  10/31/16 116/72   Wt Readings from Last 3 Encounters:  03/19/17 176 lb 9.6 oz (80.1 kg)  03/12/17 176 lb 3.2 oz (79.9 kg)  10/31/16 171 lb (77.6 kg)    Review of Systems  Constitutional: Negative for chills, diaphoresis, fatigue and fever.  Eyes: Negative for visual disturbance.  Respiratory: Negative for cough and shortness of breath.   Cardiovascular: Negative for chest pain, palpitations and leg swelling.  Gastrointestinal: Positive for abdominal pain. Negative for constipation, diarrhea, nausea and vomiting.  Endocrine: Negative for cold intolerance, heat intolerance, polydipsia, polyphagia and polyuria.  Neurological: Negative for dizziness, tremors, seizures, syncope, facial asymmetry, speech difficulty, weakness, light-headedness, numbness and headaches.    Past Medical History:  Diagnosis Date  . Asthma   . Complication of anesthesia    Sjogrens Syndrome  . Depression   . Diverticulitis   . Duodenitis   . Fibromyalgia   . Gastritis   . GERD (gastroesophageal reflux disease)   . Hypertension   . Lupus   . Melanoma ([place]) 2008   Back; [place].  . Migraine   . Obstructive sleep apnea 12/25/2012  . Pancreatitis   . Perforation bowel ([place])    "microperforation"  . Sjogren's syndrome (  [place])   . SLE (systemic lupus erythematosus) ([place])   . Urticaria   . 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 ABDOMINAL HYSTERECTOMY     Partial; Ovaries intact.  DUB/fibroids.  . ABDOMINOPLASTY    . ANTERIOR CERVICAL DECOMP/DISCECTOMY FUSION  10/29/2011   Procedure: ANTERIOR CERVICAL DECOMPRESSION/DISCECTOMY FUSION 2 LEVELS;  Surgeon: Floyce Stakes;  Location: [facility];  Service:  Neurosurgery;  Laterality: N/A;  Cervical five-six,Cervical six-seven nterior cervical decompression/diskectomy, fusion, plate  . APPENDECTOMY    . BACK SURGERY    . CERVICAL FUSION    . CHOLECYSTECTOMY    . COLONOSCOPY  11/11/2008  . COLONOSCOPY N/A 09/20/2013   Procedure: COLONOSCOPY;  Surgeon: Lafayette Dragon, MD;  Location: [facility];  Service: Endoscopy;  Laterality: N/A;  . LEFT HEART CATHETERIZATION WITH CORONARY ANGIOGRAM N/A 06/15/2014   Procedure: LEFT HEART CATHETERIZATION WITH CORONARY ANGIOGRAM;  Surgeon: Peter M Martinique, MD;  Location: [facility] CATH LAB;  Service: Cardiovascular;  Laterality: N/A;  . LIVER BIOPSY    . MELANOMA EXCISION    . PANCREAS SURGERY     sphincterotomy  . REDUCTION MAMMAPLASTY Bilateral   . SIGMOIDECTOMY  03/2012   done at [facility]  . Sleep Study  10/11/2012   +severe OSA.  [facility].  . TMJ ARTHROPLASTY  1970  . ULTRASOUND GUIDANCE FOR VASCULAR ACCESS  06/15/2014   Procedure: ULTRASOUND GUIDANCE FOR VASCULAR ACCESS;  Surgeon: Peter M Martinique, MD;  Location: [facility] CATH LAB;  Service: Cardiovascular;;   Allergies  Allergen Reactions  . Shrimp [Shellfish Allergy] Shortness Of Breath  . Meperidine Hcl Other (See Comments)    tachycardia  . Promethazine Hcl Other (See Comments)    "uncontrolled vomiting"  . Doxycycline Hives  . Latex Other (See Comments)    Breathing problems.  . Sulfonamide Derivatives Hives  . Tetracyclines & Related Rash    Social History   Social History  . Marital status: Married    Spouse name: [name]  . Number of children: 2  . Years of education: Bachelors   Occupational History  .  [employer]   Social History Main Topics  . Smoking status: Never Smoker  . Smokeless tobacco: Never Used  . Alcohol use Yes     Comment: 01/20/12 once/month  . Drug use: No  . Sexual activity: Yes    Partners: Male   Other Topics Concern  . Not on file   Social History Narrative   Marital status: married x 28 years to [place]; happily  married; no abuse      Children: 2 children (43 yo son, 45 yo daughter); no grandchildren      Lives with: husband, daughter      Employment:  [employer] 2009-2013.  Resigned 08/2012.  Bachelor degrees in communication.     Patient works as a Surveyor, minerals.   Patient drinks 2 caffeine beverages daily.   Patient is left -handed.         Tobacco:  None      Alcohol:  Once weekly; wine      Drugs: none      Exercise:  none               Family History  Problem Relation Age of Onset  . Colon polyps Sister   . Depression Sister        suicide  . Colon polyps Brother   . Colon polyps Mother   .  Dementia Mother   . Heart disease Father 82       CHF  . Macular degeneration Father   . Colon cancer Paternal Uncle        Objective:    BP 122/86   Pulse 80   Temp 97.6 F (36.4 C) (Oral)   Resp 16   Ht 5\' 5"  (1.651 m)   Wt 176 lb 3.2 oz (79.9 kg)   SpO2 98%   BMI 29.32 kg/m  Physical Exam  Constitutional: She is oriented to person, place, and time. She appears well-developed and well-nourished. No distress.  HENT:  Head: Normocephalic and atraumatic.  Right Ear: External ear normal.  Left Ear: External ear normal.  Nose: Nose normal.  Mouth/Throat: Oropharynx is clear and moist.  Eyes: Conjunctivae and EOM are normal. Pupils are equal, round, and reactive to light.  Neck: Normal range of motion. Neck supple. Carotid bruit is not present. No thyromegaly present.  Cardiovascular: Normal rate, regular rhythm, normal heart sounds and intact distal pulses.  Exam reveals no gallop and no friction rub.   No murmur heard. Pulmonary/Chest: Effort normal and breath sounds normal. She has no wheezes. She has no rales.  Abdominal: Soft. Bowel sounds are normal. She exhibits no distension and no mass. There is no tenderness. There is no rebound and no guarding.  Lymphadenopathy:    She has no cervical adenopathy.  Neurological: She is alert and oriented to person, place, and time. No cranial nerve  deficit.  Skin: Skin is warm and dry. No rash noted. She is not diaphoretic. No erythema. No pallor.  Psychiatric: She has a normal mood and affect. Her behavior is normal.   Depression screen [facility] 2/9 03/12/2017 09/17/2016 05/18/2016 03/16/2016 02/13/2016  Decreased Interest 0 0 0 0 0  Down, Depressed, Hopeless 0 0 0 0 0  PHQ - 2 Score 0 0 0 0 0        Assessment & Plan:   1. Glucose intolerance   2. Pure hypercholesterolemia   3. Generalized abdominal pain   4. Elevated lipase   5. BMI 29.0-29.9,adult   6. Depression with anxiety   7. History of sphincterotomy of sphincter of Oddi   8. Psychophysiological insomnia   9. SJOGREN'S SYNDROME   10. Other systemic lupus erythematosus with other organ involvement ([place])   11. SS-B antibody positive    -doing quite well despite recent stress of moving to [facility] -refills provided. -recommend follow-up with ophthalmology to receive further refills of ophthalmic steroid drops.   -s/p follow-up with GI; continue Lorazepam for abdominal pain.  Chronic Lipase stable and to likely continue. -continues to work on weight loss; refill of LOW DOSE Qsymia provided earlier in week.    Orders Placed This Encounter  Procedures  . CBC with Differential/Platelet  . Comprehensive metabolic panel    Order Specific Question:   Has the patient fasted?    Answer:   Yes  . Hemoglobin A1c  . Lipid panel    Order Specific Question:   Has the patient fasted?    Answer:   Yes  . TSH  . Lipase  . POCT urinalysis dipstick   Meds ordered this encounter  Medications  . fluorometholone (FML) 0.1 % ophthalmic suspension    Sig: Place 1 drop into both eyes 4 (four) times daily.    Dispense:  10 mL    Refill:  0  . LORazepam (ATIVAN) 1 MG tablet    Sig: TAKE 1  TABLET TWICE DAILY AS NEEDED FOR ANXIETY.    Dispense:  60 tablet    Refill:  3  . potassium chloride SA (K-DUR,KLOR-CON) 20 MEQ tablet    Sig: Take 1 tablet (20 mEq total) by mouth daily.     Dispense:  90 tablet    Refill:  1  . ondansetron (ZOFRAN-ODT) 8 MG disintegrating tablet    Sig: Take 1 tablet (8 mg total) by mouth every 8 (eight) hours as needed for nausea.    Dispense:  20 tablet    Refill:  3    Return in about 4 months (around 07/13/2017) for recheck.   Mitcheal Sweetin Elayne Guerin, M.D. [facility] [address] [place], [place]  [phone] phone [phone] fax

## 2017-03-12 NOTE — Patient Instructions (Signed)
     IF you received an x-ray today, you will receive an invoice from [facility]. Please contact [facility] at [phone] with questions or concerns regarding your invoice.   IF you received labwork today, you will receive an invoice from [facility]. Please contact [facility] at [phone] with questions or concerns regarding your invoice.   Our billing staff will not be able to assist you with questions regarding bills from these companies.  You will be contacted with the lab results as soon as they are available. The fastest way to get your results is to activate your My Chart account. Instructions are located on the last page of this paperwork. If you have not heard from us regarding the results in 2 weeks, please contact this office.     

## 2017-03-13 LAB — COMPREHENSIVE METABOLIC PANEL
A/G RATIO: 1.7 (ref 1.2–2.2)
ALBUMIN: 4.6 g/dL (ref 3.5–5.5)
ALT: 27 IU/L (ref 0–32)
AST: 30 IU/L (ref 0–40)
Alkaline Phosphatase: 92 IU/L (ref 39–117)
BUN/Creatinine Ratio: 18 (ref 9–23)
BUN: 14 mg/dL (ref 6–24)
Bilirubin Total: 0.2 mg/dL (ref 0.0–1.2)
CALCIUM: 9.6 mg/dL (ref 8.7–10.2)
CO2: 23 mmol/L (ref 18–29)
Chloride: 102 mmol/L (ref 96–106)
Creatinine, Ser: 0.79 mg/dL (ref 0.57–1.00)
GFR calc Af Amer: 97 mL/min/{1.73_m2} (ref >59)
GFR, EST NON AFRICAN AMERICAN: 84 mL/min/{1.73_m2} (ref >59)
GLOBULIN, TOTAL: 2.7 g/dL (ref 1.5–4.5)
Glucose: 83 mg/dL (ref 65–99)
POTASSIUM: 4.1 mmol/L (ref 3.5–5.2)
SODIUM: 139 mmol/L (ref 134–144)
TOTAL PROTEIN: 7.3 g/dL (ref 6.0–8.5)

## 2017-03-13 LAB — CBC WITH DIFFERENTIAL/PLATELET
Basophils Absolute: 0 10*3/uL (ref 0.0–0.2)
Basos: 0 %
EOS (ABSOLUTE): 0.1 10*3/uL (ref 0.0–0.4)
Eos: 1 %
HEMOGLOBIN: 14.2 g/dL (ref 11.1–15.9)
Hematocrit: 43.3 % (ref 34.0–46.6)
Immature Grans (Abs): 0 10*3/uL (ref 0.0–0.1)
Immature Granulocytes: 0 %
LYMPHS ABS: 1.5 10*3/uL (ref 0.7–3.1)
Lymphs: 34 %
MCH: 28.1 pg (ref 26.6–33.0)
MCHC: 32.8 g/dL (ref 31.5–35.7)
MCV: 86 fL (ref 79–97)
MONOCYTES: 10 %
MONOS ABS: 0.4 10*3/uL (ref 0.1–0.9)
NEUTROS ABS: 2.4 10*3/uL (ref 1.4–7.0)
Neutrophils: 55 %
PLATELETS: 210 10*3/uL (ref 150–379)
RBC: 5.05 x10E6/uL (ref 3.77–5.28)
RDW: 13.9 % (ref 12.3–15.4)
WBC: 4.4 10*3/uL (ref 3.4–10.8)

## 2017-03-13 LAB — LIPID PANEL
Chol/HDL Ratio: 3.2 ratio (ref 0.0–4.4)
Cholesterol, Total: 172 mg/dL (ref 100–199)
HDL: 54 mg/dL (ref >39)
LDL Calculated: 99 mg/dL (ref 0–99)
Triglycerides: 93 mg/dL (ref 0–149)
VLDL CHOLESTEROL CAL: 19 mg/dL (ref 5–40)

## 2017-03-13 LAB — HEMOGLOBIN A1C
ESTIMATED AVERAGE GLUCOSE: 105 mg/dL
Hgb A1c MFr Bld: 5.3 % (ref 4.8–5.6)

## 2017-03-13 LAB — LIPASE: LIPASE: 114 U/L — AB (ref 14–72)

## 2017-03-13 LAB — TSH: TSH: 1.77 u[IU]/mL (ref 0.450–4.500)

## 2017-03-15 ENCOUNTER — Telehealth: Payer: Self-pay | Admitting: Family Medicine

## 2017-03-15 NOTE — Telephone Encounter (Signed)
Pt is looking for lab results from Wednesday   [facility] number [phone]

## 2017-03-15 NOTE — Telephone Encounter (Signed)
See abn results of lipase and advise

## 2017-03-18 ENCOUNTER — Encounter: Payer: Self-pay | Admitting: Family Medicine

## 2017-03-19 ENCOUNTER — Encounter: Payer: Self-pay | Admitting: Neurology

## 2017-03-19 ENCOUNTER — Ambulatory Visit (INDEPENDENT_AMBULATORY_CARE_PROVIDER_SITE_OTHER): Payer: Medicare Other | Admitting: Neurology

## 2017-03-19 VITALS — BP 129/85 | HR 61 | Ht 65.0 in | Wt 176.6 lb

## 2017-03-19 DIAGNOSIS — G43709 Chronic migraine without aura, not intractable, without status migrainosus: Secondary | ICD-10-CM

## 2017-03-19 NOTE — Progress Notes (Signed)
Consent Form Botulism Toxin Injection For Chronic Migraine  Botulism toxin has been approved by the Federal drug administration for treatment of chronic migraine. Botulism toxin does not cure chronic migraine and it may not be effective in some patients.  The administration of botulism toxin is accomplished by injecting a small amount of toxin into the muscles of the neck and head. Dosage must be titrated for each individual. Any benefits resulting from botulism toxin tend to wear off after 3 months with a repeat injection required if benefit is to be maintained. Injections are usually done every 3-4 months with maximum effect peak achieved by about 2 or 3 weeks. Botulism toxin is expensive and you should be sure of what costs you will incur resulting from the injection.  The side effects of botulism toxin use for chronic migraine may include:   -Transient, and usually mild, facial weakness with facial injections  -Transient, and usually mild, head or neck weakness with head/neck injections  -Reduction or loss of forehead facial animation due to forehead muscle              weakness  -Eyelid drooping  -Dry eye  -Pain at the site of injection or bruising at the site of injection  -Double vision  -Potential unknown long term risks  Contraindications: You should not have Botox if you are pregnant, nursing, allergic to albumin, have an infection, skin condition, or muscle weakness at the site of the injection, or have myasthenia gravis, Lambert-Eaton syndrome, or ALS.  It is also possible that as with any injection, there may be an allergic reaction or no effect from the medication. Reduced effectiveness after repeated injections is sometimes seen and rarely infection at the injection site may occur. All care will be taken to prevent these side effects. If therapy is given over a long time, atrophy and wasting in the muscle injected may occur. Occasionally the patient's become refractory to  treatment because they develop antibodies to the toxin. In this event, therapy needs to be modified.  I have read the above information and consent to the administration of botulism toxin.    ______________  _____   _________________  Patient signature     Date   Witness signature       BOTOX PROCEDURE NOTE FOR MIGRAINE HEADACHE    Contraindications and precautions discussed with patient(above). Aseptic procedure was observed and patient tolerated procedure. Procedure performed by Dr. Georgia Dom  The condition has existed for more than 6 months, and pt does not have a diagnosis of ALS, Myasthenia Gravis or Lambert-Eaton Syndrome. Risks and benefits of injections discussed and pt agrees to proceed with the procedure. Written consent obtained  These injections are medically necessary. He receives good benefits from these injections. These injections do not cause sedations or hallucinations which the oral therapies may cause.  Indication/Diagnosis: chronic migraine BOTOX(J0585) injection was performed according to protocol by [employer]. 200 units of BOTOX was dissolved into 4 cc NS.  NDC: [address]-08 Botox 100 units/vial x 2 from office supply [place] 6761-9509-32 Lot [id number] Exp 12 2020  Diluted in 4 ml of Bacteriostatic 0.9% NaCl NDC 0998-3382-50 Lot [id number] Exp [id number]  Topical pain reliever applied to injection areas w/ gauze Lidocaine 5% cream NDC 19379-024-09 Lot [id number] Exp 06/19 Description of procedure:  The patient was placed in a sitting position. The standard protocol was used for Botox as follows, with 5 units of Botox injected at each site:   -Procerus muscle, midline injection  -  Corrugator muscle, bilateral injection  -Frontalis muscle, bilateral injection, with 2 sites each side, medial injection was performed in the upper one third of the frontalis muscle, in the region vertical from the medial inferior edge of the superior orbital rim. The lateral  injection was again in the upper one third of the forehead vertically above the lateral limbus of the cornea, 1.5 cm lateral to the medial injection site.  -Temporalis muscle injection, 4 sites, bilaterally. The first injection was 3 cm above the tragus of the ear, second injection site was 1.5 cm to 3 cm up from the first injection site in line with the tragus of the ear. The third injection site was 1.5-3 cm forward between the first 2 injection sites. The fourth injection site was 1.5 cm posterior to the second injection site.  -Occipitalis muscle injection, 3 sites, bilaterally. The first injection was done one half way between the occipital protuberance and the tip of the mastoid process behind the ear. The second injection site was done lateral and superior to the first, 1 fingerbreadth from the first injection. The third injection site was 1 fingerbreadth superiorly and medially from the first injection site.  -Cervical paraspinal muscle injection, 2 sites, bilateral knee first injection site was 1 cm from the midline of the cervical spine, 3 cm inferior to the lower border of the occipital protuberance. The second injection site was 1.5 cm superiorly and laterally to the first injection site.  -Trapezius muscle injection was performed at 3 sites, bilaterally. The first injection site was in the upper trapezius muscle halfway between the inflection point of the neck, and the acromion. The second injection site was one half way between the acromion and the first injection site. The third injection was done between the first injection site and the inflection point of the neck.   Will return for repeat injection in 3 months.   A 200 unit sof Botox was used, 155 units were injected, the rest of the Botox was wasted. The patient tolerated the procedure well, there were no complications of the above procedure.

## 2017-03-19 NOTE — Progress Notes (Signed)
Botox 100 units/vial x 2 from office supply [place] [phone] Lot [id number] Exp 12 2020  Diluted in 4 ml of Bacteriostatic 0.9% NaCl NDC 9518-8416-60 Lot [id number] Exp [id number]  Topical pain reliever applied to injection areas w/ gauze Lidocaine 5% cream NDC 93235-573-22 Lot [id number] Exp 06/19

## 2017-03-21 NOTE — Telephone Encounter (Signed)
MyChart message sent to patient to confirm that she could see labs via mychart.

## 2017-03-29 ENCOUNTER — Ambulatory Visit: Payer: Medicare Other | Admitting: Family Medicine

## 2017-03-31 ENCOUNTER — Encounter: Payer: Self-pay | Admitting: Neurology

## 2017-04-03 MED ORDER — TOPIRAMATE 25 MG PO TABS: 50.0000 mg | ORAL_TABLET | Freq: Two times a day (BID) | ORAL | 5 refills | Status: AC

## 2017-04-03 NOTE — Addendum Note (Signed)
Addended by: Monte Fantasia on: 04/03/2017 03:06 PM   Modules accepted: Orders

## 2017-04-11 ENCOUNTER — Other Ambulatory Visit: Payer: Self-pay | Admitting: Family Medicine

## 2017-04-11 NOTE — Telephone Encounter (Signed)
03/12/17 last ov 02/10/17 last refill

## 2017-04-11 NOTE — Telephone Encounter (Signed)
Pt is calling to get a refill of her Phentermin-topiramate.   Abigail [name] was supposed to have enough refills for 3 months but there are no refills.  Pt states that she has been taking this medication intermittently and that is why it has lasted this long.  Please advise [phone]

## 2017-04-14 MED ORDER — PHENTERMINE-TOPIRAMATE ER 7.5-46 MG PO CP24: 1.0000 | ORAL_CAPSULE | Freq: Every day | ORAL | 2 refills | Status: AC

## 2017-04-14 NOTE — Telephone Encounter (Signed)
Called to pharmacy 

## 2017-04-14 NOTE — Telephone Encounter (Signed)
Please call in refill of Phentermine- topiramate as approved.

## 2017-04-28 ENCOUNTER — Other Ambulatory Visit: Payer: Self-pay | Admitting: Neurology

## 2017-04-28 ENCOUNTER — Encounter: Payer: Self-pay | Admitting: Neurology

## 2017-04-28 MED ORDER — DIVALPROEX SODIUM 500 MG PO DR TAB: 500.0000 mg | DELAYED_RELEASE_TABLET | Freq: Two times a day (BID) | ORAL | 11 refills | Status: DC

## 2017-04-28 NOTE — Addendum Note (Signed)
Addended by: Monte Fantasia on: 04/28/2017 06:39 PM   Modules accepted: Orders

## 2017-06-16 ENCOUNTER — Telehealth: Payer: Self-pay | Admitting: Neurology

## 2017-06-16 NOTE — Telephone Encounter (Signed)
Patient called and cancelled Botox appointment for 06-19-17 with Dr. Jaynee Eagles because of low blood count.

## 2017-06-16 NOTE — Telephone Encounter (Signed)
Noted, thank you

## 2017-06-16 NOTE — Telephone Encounter (Signed)
Patient has appointment for Botox on 06-19-17. She has Neutropenia and Leukopenia significant?.Should she wait on having Botox.

## 2017-06-17 NOTE — Telephone Encounter (Signed)
Spoke to patient, I am not aware that this is a side effect to botox but I will look into this and I agree we hold off at this time until she has her follow up with rheumatology on the issue

## 2017-06-19 ENCOUNTER — Ambulatory Visit: Payer: Medicare Other | Admitting: Neurology

## 2017-07-07 ENCOUNTER — Other Ambulatory Visit: Payer: Self-pay | Admitting: Family Medicine

## 2017-07-07 NOTE — Telephone Encounter (Signed)
Please call in refill of Lorazepam as approved.

## 2017-07-08 ENCOUNTER — Telehealth: Payer: Self-pay

## 2017-07-08 NOTE — Telephone Encounter (Signed)
Called in Rx for ativan to pharmacy.

## 2017-09-16 ENCOUNTER — Other Ambulatory Visit: Payer: Self-pay | Admitting: Family Medicine

## 2017-10-29 ENCOUNTER — Encounter: Payer: Self-pay | Admitting: Family Medicine

## 2017-11-12 ENCOUNTER — Telehealth: Payer: Self-pay | Admitting: Neurology

## 2017-11-12 NOTE — Telephone Encounter (Signed)
Pt is calling to schedule botox appt. She has new insurance: [employer] Medicare Complete effective 11/11/17 Member ID [id number] Grp# [id number] RX Bin # [phone]

## 2017-11-13 ENCOUNTER — Ambulatory Visit: Payer: Medicare Other | Admitting: Neurology

## 2017-11-13 ENCOUNTER — Encounter: Payer: Self-pay | Admitting: Neurology

## 2017-11-13 VITALS — BP 115/74 | HR 70 | Ht 65.0 in | Wt 191.0 lb

## 2017-11-13 DIAGNOSIS — G43709 Chronic migraine without aura, not intractable, without status migrainosus: Secondary | ICD-10-CM

## 2017-11-13 DIAGNOSIS — G4733 Obstructive sleep apnea (adult) (pediatric): Secondary | ICD-10-CM | POA: Diagnosis not present

## 2017-11-13 NOTE — Progress Notes (Signed)
Botox-100 units x 2 vials Lot: [id number] Expiration: 04/2020 NDC: 0023-1145-01  Bacteriostatic 0.9% Sodium Chloride- 4mL total Lot: [id number] Expiration: 03/12/2019 NDC: 0409-1966-02 Dx: G43.709 B/B //BCrn  

## 2017-11-13 NOTE — Progress Notes (Signed)
[facility]    Provider:  Dr Abigail [name] Referring Provider: [name], MD Primary Care Physician:  System, Provider Not In  CC:  OSA and OSA  She was diagnosed with OSA. Her study from 10-2012 documented an AHI of 28.2 and RDI of 37.3 /hr. Nadir 79%  Discussed what this means, reviewed sleep test, recommend repeat sleep test last was in 2013. Discussed using cpap, sequela of untreated sleep apnea.   Botox injections: 12/07/2015, then received botox from Abigail [name] for several months due to insurance difficulties and came back to our office for continuation of botox 10/21/2016, 03/19/2017, she did not return for botox due to other medical conditions.  She did great on Botox, migraine and headache frequency, severity and duration reduced by 80%. Since stopping she has daily headaches again, 30 migraine days a month, 15 are migrainous and can last all day. No aura. It starts on the left side then spreads bilaterally.She has light sensitivity, sound sensitivity, nausea, has to close all the blinds, has vomited in the past. It is throbbing, pulsating, like someone is hitting your head from the inside. She gets confused during the migraines. The migraines are daily. They can last up to 24 hours. 10/10 severity. She takes 25mg  of topamax and she has been referred for glaucoma and have advised her to stop Topiramate. She has failed multiple medications.   Interval history: Abigail [name] is a 57 y.o. female here as a follow up from Abigail [name] for migraines. She has a past medical history of migraines and have several Botox injections. She has sleep apnea and is on a cpap. She moved to [place] this year. Due to low WBCs she held off on last botox. Marland Kitchen   HPI 11/16/2015:   She has a PMHx of Lupus, Sjogren's syndrome, Migraine, Vasculitis, Pancreatitis, Fibromyalgia, Depression, OSA. She is here for evaluation of her Migraines. She hs had bad migraines all her life. She has tried miultiple  triptans. She has been on Depakote and that stopped working and she stopped taking it. She had an MRI last year and it was normal. Migraines started since the age of 54. She had cosmetic botox and it helped her migraines. She has cervical muscle pain. No aura. It starts on the left side then spreads bilaterally.She has light sensitivity, sound sensitivity, nausea, has to close all the blinds, has vomited in the past. It is throbbing, pulsating, like someone is hitting your head from the inside. She gets confused during the migraines. The migraines are daily. They can last up to 24 hours. 10/10 severity. Daily migraines for over a year. She does not take OTC medications, no medication overuse. She has examined all food triggers, she went through biofeedback training. 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   Review of Systems: Patient complains of symptoms per HPI as well as the following symptoms: worsening migraines. Pertinent negatives and positives per HPI. All others negative.   Social History   Socioeconomic History  . Marital status: Married    Spouse name: Abigail [name]  . Number of children: 2  . Years of education: Bachelors  . Highest education level: Not on file  Social Needs  . Financial resource strain: Not on file  . Food insecurity - worry: Not on file  .  Food insecurity - inability: Not on file  . Transportation needs - medical: Not on file  . Transportation needs - non-medical: Not on file  Occupational History    Employer: [employer]  Tobacco Use  . Smoking status: Never Smoker  . Smokeless tobacco: Never Used  Substance and Sexual Activity  . Alcohol use: Yes    Comment: 01/20/12 once/month  . Drug use: No  . Sexual activity: Yes    Partners: Male  Other Topics  Concern  . Not on file  Social History Narrative   Marital status: married x 28 years to [place]; happily married; no abuse      Children: 2 children (67 yo son, 93 yo daughter); no grandchildren      Lives with: husband, daughter      Employment:  [employer] 2009-2013.  Resigned 08/2012.  Bachelor degrees in communication.     Patient works as a Surveyor, minerals.   Patient drinks 2 caffeine beverages daily.   Patient is left -handed.         Tobacco:  None      Alcohol:  Once weekly; wine      Drugs: none      Exercise:  none                Family History  Problem Relation Age of Onset  . Colon polyps Sister   . Depression Sister        suicide  . Colon polyps Brother   . Colon polyps Mother   . Dementia Mother   . Heart disease Father 76       CHF  . Macular degeneration Father   . Colon cancer Paternal Uncle     Past Medical History:  Diagnosis Date  . Asthma   . Complication of anesthesia    Sjogrens Syndrome  . Depression   . Diverticulitis   . Duodenitis   . Fibromyalgia   . Gastritis   . GERD (gastroesophageal reflux disease)   . Hypertension   . Lupus   . Melanoma ([place]) 2008   Back; [place].  . Migraine   . Obstructive sleep apnea 12/25/2012  . Pancreatitis   . Perforation bowel ([place])    "microperforation"  . Sjogren's syndrome ([place])   . SLE (systemic lupus erythematosus) ([place])   . Urticaria   . Vasculitis ([place])    "autoimmune"    Past Surgical History:  Procedure Laterality Date  . ABDOMINAL HYSTERECTOMY     Partial; Ovaries intact.  DUB/fibroids.  . ABDOMINOPLASTY    . ANTERIOR CERVICAL DECOMP/DISCECTOMY FUSION  10/29/2011   Procedure: ANTERIOR CERVICAL DECOMPRESSION/DISCECTOMY FUSION 2 LEVELS;  Surgeon: Floyce Stakes;  Location: [facility];  Service: Neurosurgery;  Laterality: N/A;  Cervical five-six,Cervical six-seven nterior cervical decompression/diskectomy, fusion, plate  . APPENDECTOMY    . BACK SURGERY    . CERVICAL FUSION    . CHOLECYSTECTOMY      . COLONOSCOPY  11/11/2008  . COLONOSCOPY N/A 09/20/2013   Procedure: COLONOSCOPY;  Surgeon: Lafayette Dragon, MD;  Location: [facility];  Service: Endoscopy;  Laterality: N/A;  . LEFT HEART CATHETERIZATION WITH CORONARY ANGIOGRAM N/A 06/15/2014   Procedure: LEFT HEART CATHETERIZATION WITH CORONARY ANGIOGRAM;  Surgeon: Peter M Martinique, MD;  Location: [facility] CATH LAB;  Service: Cardiovascular;  Laterality: N/A;  . LIVER BIOPSY    . MELANOMA EXCISION    . PANCREAS SURGERY     sphincterotomy  . REDUCTION MAMMAPLASTY Bilateral   . SIGMOIDECTOMY  03/2012   done at [facility]  . Sleep Study  10/11/2012   +severe OSA.  [facility].  . TMJ ARTHROPLASTY  1970  . ULTRASOUND GUIDANCE FOR VASCULAR ACCESS  06/15/2014   Procedure: ULTRASOUND GUIDANCE FOR VASCULAR ACCESS;  Surgeon: Peter M Martinique, MD;  Location: [facility] CATH LAB;  Service: Cardiovascular;;    Current Outpatient Medications  Medication Sig Dispense Refill  . albuterol (PROAIR HFA) 108 (90 BASE) MCG/ACT inhaler Use 2 puffs every 6 hours as needed for shortness of breath 8.5 g 0  . azelastine (OPTIVAR) 0.05 % ophthalmic solution daily.    . clonazePAM (KLONOPIN) 0.5 MG tablet Take 0.5 mg by mouth 2 (two) times daily as needed for anxiety.    . fluorometholone (FML) 0.1 % ophthalmic suspension Place 1 drop into both eyes 4 (four) times daily. 10 mL 0  . fluticasone (FLONASE) 50 MCG/ACT nasal spray Place 2 sprays into both nostrils daily. 16 g 11  . LORazepam (ATIVAN) 1 MG tablet Take 1 tablet (1 mg total) by mouth daily as needed for anxiety. for anxiety 30 tablet 0  . omeprazole (PRILOSEC) 40 MG capsule Take 40 mg by mouth daily.    . ondansetron (ZOFRAN-ODT) 8 MG disintegrating tablet Take 1 tablet (8 mg total) by mouth every 8 (eight) hours as needed for nausea. 20 tablet 3  . potassium chloride SA (K-DUR,KLOR-CON) 20 MEQ tablet Take 1 tablet (20 mEq total) by mouth daily. (Patient taking differently: Take 10 mEq by mouth daily. ) 90 tablet 1  .  topiramate (TOPAMAX) 25 MG tablet Take 2 tablets (50 mg total) by mouth 2 (two) times daily. 60 tablet 5  . verapamil (CALAN-SR) 120 MG CR tablet TAKE ONE TABLET AT BEDTIME. 90 tablet 0  . Phentermine-Topiramate 7.5-46 MG CP24 Take 1 capsule by mouth daily. (Patient not taking: Reported on 11/13/2017) 30 capsule 2   No current facility-administered medications for this visit.     Allergies as of 11/13/2017 - Review Complete 11/13/2017  Allergen Reaction Noted  . Shrimp [shellfish allergy] Shortness Of Breath 11/14/2012  . Meperidine hcl Other (See Comments)   . Promethazine hcl Other (See Comments) 10/28/2011  . Doxycycline Hives   . Latex Other (See Comments) 10/28/2011  . Sulfonamide derivatives Hives   . Tetracyclines & related Rash 01/18/2012    Vitals: BP 115/74   Pulse 70   Ht 5\' 5"  (1.651 m)   Wt 191 lb (86.6 kg)   BMI 31.78 kg/m  Last Weight:  Wt Readings from Last 1 Encounters:  11/13/17 191 lb (86.6 kg)   Last Height:   Ht Readings from Last 1 Encounters:  11/13/17 5\' 5"  (1.651 m)    Physical exam: Exam: Gen: NAD, conversant, well nourised, obese, well groomed                     CV: RRR, no MRG. No Carotid Bruits. No peripheral edema, warm, nontender Eyes: Conjunctivae clear without exudates or hemorrhage  Neuro: Detailed Neurologic Exam  Speech:    Speech is normal; fluent and spontaneous with normal comprehension.  Cognition:    The patient is oriented to person, place, and time;   Cranial Nerves:    The pupils are equal, round, and reactive to light. Visual fields are full to finger confrontation. Extraocular movements are intact. Trigeminal sensation is intact and the muscles of mastication are normal.  The palate elevates in the midline. Hearing intact. Voice is normal. Shoulder shrug is normal.  The tongue has normal motion without fasciculations.   Motor Observation:    No asymmetry, no atrophy, and no involuntary movements noted. Tone:    Normal  muscle tone.    Posture:    Posture is normal. normal erect    Strength:    Strength is V/V in the upper and lower limbs.         Assessment/Plan:  57 year old patient here with OSA and chronic migraines.    OSA: Encouraged cpap use for OSA, will refer back to sleep evaluation, she prefers a new sleep evaluation. Discussed complicane with cpap in depth especially risk of stroke and other sequela of OSA. Also discussed she is not using her cpap, spent most of the appointment discussing cpap and OSA.  Orders Placed This Encounter  Procedures  . Ambulatory referral to Sleep Studies    She has tried most first line medications still has daily migraines.  She did great on Botox, migraine and headache frequency, severity and duration reduced by 80%. Recommend continuing botox.   Sarina Ill, MD  [facility] [address] [place], [place] [address]  Phone [phone] Fax [phone]  A total of 25 minutes was spent face-to-face with this patient. Over half this time was spent on counseling patient on the OSA diagnosis and different diagnostic and therapeutic options available. This does not include time spent injecting botox.

## 2017-11-13 NOTE — Telephone Encounter (Signed)
I met with the patient today and scheduled her botox injection.

## 2017-11-13 NOTE — Progress Notes (Signed)

## 2018-02-12 ENCOUNTER — Ambulatory Visit: Payer: Medicare Other | Admitting: Neurology

## 2018-04-06 ENCOUNTER — Encounter: Payer: Self-pay | Admitting: Family Medicine

## 2019-05-19 ENCOUNTER — Telehealth: Payer: Self-pay | Admitting: Neurology

## 2019-05-19 NOTE — Telephone Encounter (Signed)
This patient called today and stated that she was having head aches again and wanted to start Botox back. It appears she had an apt she canceled in April and her last injection was January of 2019. I expressed the importance of staying on schedule with the injection but she stated that she likes to wait as long as she can until the headaches are so bad she feels she needs the injections. I went ahead and scheduled an injection but please advise if this is ok or if she needs an OV first. She is also going to mychart Dr. Jaynee Eagles with questions. DW

## 2019-05-19 NOTE — Telephone Encounter (Signed)
That's fine can Amy take her over for botox?

## 2019-05-20 NOTE — Telephone Encounter (Signed)
Will schedule follow up apt with Amy NP. DW

## 2019-06-09 ENCOUNTER — Ambulatory Visit (INDEPENDENT_AMBULATORY_CARE_PROVIDER_SITE_OTHER): Payer: Medicare Other | Admitting: Neurology

## 2019-06-09 ENCOUNTER — Encounter: Payer: Self-pay | Admitting: Neurology

## 2019-06-09 ENCOUNTER — Other Ambulatory Visit: Payer: Self-pay

## 2019-06-09 VITALS — Temp 98.4°F

## 2019-06-09 DIAGNOSIS — G43709 Chronic migraine without aura, not intractable, without status migrainosus: Secondary | ICD-10-CM | POA: Diagnosis not present

## 2019-06-09 DIAGNOSIS — M7918 Myalgia, other site: Secondary | ICD-10-CM

## 2019-06-09 NOTE — Progress Notes (Signed)
Botox- 200 units x 4 vials Lot: [id number] Expiration: 09/2021 NDC: 4709-6283-66  Bacteriostatic 0.9% Sodium Chloride- 15mL total Lot: [id number] Expiration: 08/12/2019 NDC: 4650-3546-56  Dx: C12.751 B/B

## 2019-06-09 NOTE — Progress Notes (Signed)
Consent Form Botulism Toxin Injection For Chronic Migraine    06/09/2019: Second botox. Baseline is daily headaches again, 30  Headache days a month, 15 are migrainous and can last all day. She drives from [place]. She gets botox but only every 6 months it lasts. She does not want any new acute medication, she goes to bed and takes motrin. She does not want to take injections, she has Lupus and feels safe with botox.  Only levator scapulae not OO, mass or temples. She is from the [place] area. Herhusband's schwannoma came back and he needs treatment again. Her brother (aged 59 109 years her senior) has dementia and her daughter is a patient of mine. She saw Dr. Erling Cruz in hte past and her husband was helped by Dr Erling Cruz to get to [facility] Dr. Tommi Rumps.  PLEASE FIND DRY NEEDLING IN [place] for cervical myofascial pain syndrome   Botulism toxin has been approved by the Federal drug administration for treatment of chronic migraine. Botulism toxin does not cure chronic migraine and it may not be effective in some patients.  The administration of botulism toxin is accomplished by injecting a small amount of toxin into the muscles of the neck and head. Dosage must be titrated for each individual. Any benefits resulting from botulism toxin tend to wear off after 3 months with a repeat injection required if benefit is to be maintained. Injections are usually done every 3-4 months with maximum effect peak achieved by about 2 or 3 weeks. Botulism toxin is expensive and you should be sure of what costs you will incur resulting from the injection.  The side effects of botulism toxin use for chronic migraine may include:   -Transient, and usually mild, facial weakness with facial injections  -Transient, and usually mild, head or neck weakness with head/neck injections  -Reduction or loss of forehead facial animation due to forehead muscle              weakness  -Eyelid drooping  -Dry eye  -Pain at the site  of injection or bruising at the site of injection  -Double vision  -Potential unknown long term risks  Contraindications: You should not have Botox if you are pregnant, nursing, allergic to albumin, have an infection, skin condition, or muscle weakness at the site of the injection, or have myasthenia gravis, Lambert-Eaton syndrome, or ALS.  It is also possible that as with any injection, there may be an allergic reaction or no effect from the medication. Reduced effectiveness after repeated injections is sometimes seen and rarely infection at the injection site may occur. All care will be taken to prevent these side effects. If therapy is given over a long time, atrophy and wasting in the muscle injected may occur. Occasionally the patient's become refractory to treatment because they develop antibodies to the toxin. In this event, therapy needs to be modified.  I have read the above information and consent to the administration of botulism toxin.    ______________  _____   _________________  Patient signature     Date   Witness signature       BOTOX PROCEDURE NOTE FOR MIGRAINE HEADACHE    Contraindications and precautions discussed with patient(above). Aseptic procedure was observed and patient tolerated procedure. Procedure performed by Dr. Georgia Dom  The condition has existed for more than 6 months, and pt does not have a diagnosis of ALS, Myasthenia Gravis or Lambert-Eaton Syndrome. Risks and benefits of injections discussed and pt agrees  to proceed with the procedure. Written consent obtained  These injections are medically necessary. He receives good benefits from these injections. These injections do not cause sedations or hallucinations which the oral therapies may cause.  Indication/Diagnosis: chronic migraine BOTOX(J0585) injection was performed according to protocol by [employer]. 200 units of BOTOX was dissolved into 4 cc NS.  NDC: [address]-62  Description of  procedure:  The patient was placed in a sitting position. The standard protocol was used for Botox as follows, with 5 units of Botox injected at each site:   -Procerus muscle, midline injection  -Corrugator muscle, bilateral injection  -Frontalis muscle, bilateral injection, with 2 sites each side, medial injection was performed in the upper one third of the frontalis muscle, in the region vertical from the medial inferior edge of the superior orbital rim. The lateral injection was again in the upper one third of the forehead vertically above the lateral limbus of the cornea, 1.5 cm lateral to the medial injection site.  -Temporalis muscle injection, 4 sites, bilaterally. The first injection was 3 cm above the tragus of the ear, second injection site was 1.5 cm to 3 cm up from the first injection site in line with the tragus of the ear. The third injection site was 1.5-3 cm forward between the first 2 injection sites. The fourth injection site was 1.5 cm posterior to the second injection site.  -Occipitalis muscle injection, 3 sites, bilaterally. The first injection was done one half way between the occipital protuberance and the tip of the mastoid process behind the ear. The second injection site was done lateral and superior to the first, 1 fingerbreadth from the first injection. The third injection site was 1 fingerbreadth superiorly and medially from the first injection site.  -Cervical paraspinal muscle injection, 2 sites, bilateral knee first injection site was 1 cm from the midline of the cervical spine, 3 cm inferior to the lower border of the occipital protuberance. The second injection site was 1.5 cm superiorly and laterally to the first injection site.  -Trapezius muscle injection was performed at 3 sites, bilaterally. The first injection site was in the upper trapezius muscle halfway between the inflection point of the neck, and the acromion. The second injection site was one half way  between the acromion and the first injection site. The third injection was done between the first injection site and the inflection point of the neck.   Will return for repeat injection in 3 months.   A 200 unit sof Botox was used, 155 units were injected, the rest of the Botox was wasted. The patient tolerated the procedure well, there were no complications of the above procedure.

## 2019-06-09 NOTE — Telephone Encounter (Signed)
I called patients insurance to confirm it was active and billable and check pre cert requirements on Botox codes. I spoke with representative Elta Guadeloupe who stated that the patient was active and NPR. DPB#[id number]. DW

## 2019-07-22 ENCOUNTER — Telehealth: Payer: Self-pay | Admitting: Neurology

## 2019-07-22 NOTE — Telephone Encounter (Signed)
I called to schedule the patient for their next Botox injection but they did not answer so I left a VM asking them to call us back.
